# Patient Record
Sex: Female | Born: 1950 | Race: White | Hispanic: No | Marital: Married | Smoking: Never smoker
Health system: Southern US, Community
[De-identification: ages and names within clinical notes are randomized; demographics above are authoritative.]

## PROBLEM LIST (undated history)

## (undated) DIAGNOSIS — F410 Panic disorder [episodic paroxysmal anxiety] without agoraphobia: Secondary | ICD-10-CM

## (undated) DIAGNOSIS — E559 Vitamin D deficiency, unspecified: Secondary | ICD-10-CM

## (undated) DIAGNOSIS — I809 Phlebitis and thrombophlebitis of unspecified site: Secondary | ICD-10-CM

## (undated) DIAGNOSIS — Z9889 Other specified postprocedural states: Secondary | ICD-10-CM

## (undated) DIAGNOSIS — T4145XA Adverse effect of unspecified anesthetic, initial encounter: Secondary | ICD-10-CM

## (undated) DIAGNOSIS — Z8489 Family history of other specified conditions: Secondary | ICD-10-CM

## (undated) DIAGNOSIS — E039 Hypothyroidism, unspecified: Secondary | ICD-10-CM

## (undated) DIAGNOSIS — K219 Gastro-esophageal reflux disease without esophagitis: Secondary | ICD-10-CM

## (undated) DIAGNOSIS — S43006A Unspecified dislocation of unspecified shoulder joint, initial encounter: Secondary | ICD-10-CM

## (undated) DIAGNOSIS — R112 Nausea with vomiting, unspecified: Secondary | ICD-10-CM

## (undated) DIAGNOSIS — I10 Essential (primary) hypertension: Secondary | ICD-10-CM

## (undated) DIAGNOSIS — E119 Type 2 diabetes mellitus without complications: Secondary | ICD-10-CM

## (undated) DIAGNOSIS — R251 Tremor, unspecified: Secondary | ICD-10-CM

## (undated) DIAGNOSIS — D649 Anemia, unspecified: Secondary | ICD-10-CM

## (undated) DIAGNOSIS — G4730 Sleep apnea, unspecified: Secondary | ICD-10-CM

## (undated) DIAGNOSIS — E785 Hyperlipidemia, unspecified: Secondary | ICD-10-CM

## (undated) HISTORY — DX: Tremor, unspecified: R25.1

## (undated) HISTORY — DX: Vitamin D deficiency, unspecified: E55.9

## (undated) HISTORY — DX: Anemia, unspecified: D64.9

## (undated) HISTORY — DX: Hyperlipidemia, unspecified: E78.5

## (undated) HISTORY — DX: Phlebitis and thrombophlebitis of unspecified site: I80.9

## (undated) MED FILL — Iron Sucrose Inj 20 MG/ML (Fe Equiv): INTRAVENOUS | Qty: 10 | Status: AC

---

## 1973-09-21 DIAGNOSIS — I809 Phlebitis and thrombophlebitis of unspecified site: Secondary | ICD-10-CM

## 1973-09-21 HISTORY — DX: Phlebitis and thrombophlebitis of unspecified site: I80.9

## 1999-09-22 HISTORY — PX: BILATERAL SALPINGOOPHORECTOMY: SHX1223

## 2000-01-06 ENCOUNTER — Other Ambulatory Visit: Admission: RE | Admit: 2000-01-06 | Discharge: 2000-01-06 | Payer: Self-pay | Admitting: Obstetrics and Gynecology

## 2000-01-06 ENCOUNTER — Encounter (INDEPENDENT_AMBULATORY_CARE_PROVIDER_SITE_OTHER): Payer: Self-pay | Admitting: Specialist

## 2000-02-18 ENCOUNTER — Ambulatory Visit (HOSPITAL_COMMUNITY): Admission: RE | Admit: 2000-02-18 | Discharge: 2000-02-18 | Payer: Self-pay | Admitting: Obstetrics and Gynecology

## 2000-02-18 ENCOUNTER — Encounter: Payer: Self-pay | Admitting: Obstetrics and Gynecology

## 2000-02-20 HISTORY — PX: TOTAL VAGINAL HYSTERECTOMY: SHX2548

## 2000-03-08 ENCOUNTER — Inpatient Hospital Stay (HOSPITAL_COMMUNITY): Admission: RE | Admit: 2000-03-08 | Discharge: 2000-03-10 | Payer: Self-pay | Admitting: Obstetrics and Gynecology

## 2000-03-08 ENCOUNTER — Encounter (INDEPENDENT_AMBULATORY_CARE_PROVIDER_SITE_OTHER): Payer: Self-pay

## 2000-03-12 ENCOUNTER — Emergency Department (HOSPITAL_COMMUNITY): Admission: EM | Admit: 2000-03-12 | Discharge: 2000-03-12 | Payer: Self-pay

## 2001-04-26 ENCOUNTER — Ambulatory Visit (HOSPITAL_COMMUNITY): Admission: RE | Admit: 2001-04-26 | Discharge: 2001-04-26 | Payer: Self-pay | Admitting: Obstetrics and Gynecology

## 2001-04-26 ENCOUNTER — Encounter: Payer: Self-pay | Admitting: Obstetrics and Gynecology

## 2001-10-19 ENCOUNTER — Other Ambulatory Visit: Admission: RE | Admit: 2001-10-19 | Discharge: 2001-10-19 | Payer: Self-pay | Admitting: Obstetrics and Gynecology

## 2002-09-28 ENCOUNTER — Ambulatory Visit (HOSPITAL_COMMUNITY): Admission: RE | Admit: 2002-09-28 | Discharge: 2002-09-28 | Payer: Self-pay | Admitting: Obstetrics and Gynecology

## 2002-09-28 ENCOUNTER — Encounter: Payer: Self-pay | Admitting: Obstetrics and Gynecology

## 2003-09-22 DIAGNOSIS — S43006A Unspecified dislocation of unspecified shoulder joint, initial encounter: Secondary | ICD-10-CM

## 2003-09-22 HISTORY — DX: Unspecified dislocation of unspecified shoulder joint, initial encounter: S43.006A

## 2003-11-07 ENCOUNTER — Ambulatory Visit (HOSPITAL_COMMUNITY): Admission: RE | Admit: 2003-11-07 | Discharge: 2003-11-07 | Payer: Self-pay | Admitting: Obstetrics and Gynecology

## 2004-08-04 ENCOUNTER — Ambulatory Visit (HOSPITAL_BASED_OUTPATIENT_CLINIC_OR_DEPARTMENT_OTHER): Admission: RE | Admit: 2004-08-04 | Discharge: 2004-08-04 | Payer: Self-pay | Admitting: Family Medicine

## 2004-08-04 ENCOUNTER — Ambulatory Visit: Payer: Self-pay | Admitting: Pulmonary Disease

## 2004-09-23 ENCOUNTER — Ambulatory Visit: Payer: Self-pay | Admitting: Pulmonary Disease

## 2004-10-07 ENCOUNTER — Ambulatory Visit: Payer: Self-pay | Admitting: Family Medicine

## 2004-10-21 ENCOUNTER — Ambulatory Visit: Payer: Self-pay | Admitting: Pulmonary Disease

## 2004-10-30 ENCOUNTER — Ambulatory Visit (HOSPITAL_BASED_OUTPATIENT_CLINIC_OR_DEPARTMENT_OTHER): Admission: RE | Admit: 2004-10-30 | Discharge: 2004-10-30 | Payer: Self-pay | Admitting: Pulmonary Disease

## 2004-10-30 ENCOUNTER — Encounter: Payer: Self-pay | Admitting: Pulmonary Disease

## 2004-11-14 ENCOUNTER — Ambulatory Visit (HOSPITAL_COMMUNITY): Admission: RE | Admit: 2004-11-14 | Discharge: 2004-11-14 | Payer: Self-pay | Admitting: Obstetrics and Gynecology

## 2004-11-18 ENCOUNTER — Ambulatory Visit: Payer: Self-pay | Admitting: Family Medicine

## 2004-11-24 ENCOUNTER — Encounter: Payer: Self-pay | Admitting: Pulmonary Disease

## 2004-11-27 ENCOUNTER — Ambulatory Visit: Payer: Self-pay | Admitting: Pulmonary Disease

## 2005-01-05 ENCOUNTER — Ambulatory Visit: Payer: Self-pay | Admitting: Family Medicine

## 2005-02-19 HISTORY — PX: COLONOSCOPY: SHX174

## 2005-03-04 ENCOUNTER — Ambulatory Visit (HOSPITAL_COMMUNITY): Admission: RE | Admit: 2005-03-04 | Discharge: 2005-03-04 | Payer: Self-pay | Admitting: Gastroenterology

## 2005-07-31 ENCOUNTER — Ambulatory Visit: Payer: Self-pay | Admitting: Family Medicine

## 2005-08-01 ENCOUNTER — Emergency Department (HOSPITAL_COMMUNITY): Admission: EM | Admit: 2005-08-01 | Discharge: 2005-08-01 | Payer: Self-pay | Admitting: Emergency Medicine

## 2005-08-10 ENCOUNTER — Ambulatory Visit: Payer: Self-pay | Admitting: Family Medicine

## 2005-12-14 ENCOUNTER — Ambulatory Visit (HOSPITAL_COMMUNITY): Admission: RE | Admit: 2005-12-14 | Discharge: 2005-12-14 | Payer: Self-pay | Admitting: Obstetrics and Gynecology

## 2006-03-16 ENCOUNTER — Encounter: Admission: RE | Admit: 2006-03-16 | Discharge: 2006-03-16 | Payer: Self-pay | Admitting: Obstetrics and Gynecology

## 2007-01-05 ENCOUNTER — Ambulatory Visit (HOSPITAL_COMMUNITY): Admission: RE | Admit: 2007-01-05 | Discharge: 2007-01-05 | Payer: Self-pay | Admitting: Obstetrics and Gynecology

## 2007-01-12 ENCOUNTER — Encounter: Admission: RE | Admit: 2007-01-12 | Discharge: 2007-01-12 | Payer: Self-pay | Admitting: Obstetrics and Gynecology

## 2008-01-06 ENCOUNTER — Ambulatory Visit (HOSPITAL_COMMUNITY): Admission: RE | Admit: 2008-01-06 | Discharge: 2008-01-06 | Payer: Self-pay | Admitting: Obstetrics and Gynecology

## 2009-02-04 ENCOUNTER — Ambulatory Visit (HOSPITAL_COMMUNITY): Admission: RE | Admit: 2009-02-04 | Discharge: 2009-02-04 | Payer: Self-pay | Admitting: Obstetrics and Gynecology

## 2009-05-07 DIAGNOSIS — E785 Hyperlipidemia, unspecified: Secondary | ICD-10-CM

## 2009-05-07 DIAGNOSIS — G4733 Obstructive sleep apnea (adult) (pediatric): Secondary | ICD-10-CM | POA: Insufficient documentation

## 2009-05-07 DIAGNOSIS — J309 Allergic rhinitis, unspecified: Secondary | ICD-10-CM | POA: Insufficient documentation

## 2009-05-08 ENCOUNTER — Ambulatory Visit: Payer: Self-pay | Admitting: Pulmonary Disease

## 2009-05-08 DIAGNOSIS — E119 Type 2 diabetes mellitus without complications: Secondary | ICD-10-CM | POA: Insufficient documentation

## 2010-02-06 ENCOUNTER — Ambulatory Visit (HOSPITAL_COMMUNITY): Admission: RE | Admit: 2010-02-06 | Discharge: 2010-02-06 | Payer: Self-pay | Admitting: Family Medicine

## 2010-10-12 ENCOUNTER — Encounter: Payer: Self-pay | Admitting: Obstetrics and Gynecology

## 2011-02-06 NOTE — Op Note (Signed)
[facility]  Patient:    EARLY, ORD                      MRN: [id number] Proc. Date: 03/08/00 Adm. Date:  [id number] Attending:  Brynda Peon CC:         Edwena Felty. Ashley Royalty, M.D.                           Operative Report  PREOPERATIVE DIAGNOSES:  Grade 3 uterine prolapse, menorrhagia, abnormal uterine bleeding.  POSTOPERATIVE DIAGNOSES:  Grade 3 uterine prolapse, menorrhagia, abnormal uterine bleeding, peritubal cyst on left.  PROCEDURES:  Total vaginal hysterectomy, drainage of left peritubal cyst and bilateral salpingo-oophorectomy.  SURGEON:  Cynthia P. Ashley Royalty, M.D.  ASSISTANT:  Andres Ege, M.D.  ANESTHESIA:  General endotracheal.  ESTIMATED BLOOD LOSS:  250 cc.  COMPLICATIONS:  None.  DESCRIPTION OF PROCEDURE:  The patient was taken to the operating room and after the induction of adequate general endotracheal anesthesia was prepped and draped in the usual fashion and the bladder was drained with a red rubber catheter. The cervix was grasped with a Christella Hartigan tenaculum and the vaginal mucosa was injected beneath its surface with 6 cc of xylocaine with epinephrine. The knife was used to incise the vaginal mucosa off the cervix, it was pushed back sharply and bluntly. Posteriorly a colpotomy incision was made with Mayo scissors and the long Bonnano retractor was placed. The uterosacral ligaments were clamped, cut and doubly ligated with #0 chromic on each side. The uterine arteries were also then clamped, cut and doubly ligated on each side. The hysterectomy proceeded up the broad ligament clamping, cutting and tying sequentially. The pedicles containing the utero-ovarian ligament, the tube and the round ligament were clamped and the specimen was removed and sent to pathology. Inspection of the ovaries and tubes on the right revealed a normal tube and ovary; however, on the left the ovary appeared normal but the tube  had a rather large peritubal cyst associated with it. The tube and the cyst seemed congruous in that the tube was plastered across the surface of the cyst and the cyst was not pedunculated. The cyst was approximately 5 cc in diameter. A needle was inserted into the cyst and 150 cc of clear fluid was drained from the cyst when it was decompressed. It was possible to clamp the infundibulopelvic ligament behind the tube and ovary and cyst and remove the tube, ovary and cyst. The pedicle was triply tied with #0 chromic. On the right side, the pedicle containing the infundibulopelvic ligament was clamped, cut and triply ligated with #0 chromic as well. The tubes and ovaries including the left peritubal cyst were also sent to pathology. The pedicles were inspected and were hemostatic. The vaginal cuff was run in a running locking suture with #1 chromic to achieve hemostasis. A McCall stitch was placed plicating the uterosacral ligaments, the vaginal cuff was closed with figure-of-eight sutures of #1 chromic and the procedure was terminated. The patient tolerated the procedure well and went in satisfactory condition to post anesthesia recovery. Sponge, needle and instrument count was correct x 3. DD:  03/08/00 TD:  03/10/00 Job: [id number]

## 2011-02-06 NOTE — Procedures (Signed)
Kimberly Barker, Kimberly Barker NO.:  [id number]   MEDICAL RECORD NO.:  [id number]          PATIENT TYPE:  OUT   LOCATION:  SLEEP CENTER                 FACILITY:  [facility]   PHYSICIAN:  Marcelyn Bruins, M.D. [facility] DATE OF BIRTH:  12-15-1950   DATE OF STUDY:  08/04/2004                              NOCTURNAL POLYSOMNOGRAM   REFERRING PHYSICIAN:  Dr. Dina Rich   INDICATIONS FOR STUDY:  Hypersomnia with sleep apnea.   SLEEP ARCHITECTURE:  The patient had a total sleep time of 367 minutes but  never achieved REM or slow-wave sleep.  Sleep efficiency was only 80%. Sleep  onset latency was normal.   IMPRESSION:  1.  Split night study reveals a very severe obstructive sleep apnea with the      patient having 314 events in the first 152 minutes of sleep.  This gave      her a respiratory disturbance index of 124 events per hour, and O2      desaturation as low as 63%.  The patient spent the majority of the night      in the supine position. There was loud snoring throughout. By split      night protocol the patient was placed on CPAP with Respironics full face      medium sized mask and the pressure was titrated upwards. Despite 25 cm      of CPAP the patient continued to have severe break-through events with      desaturation into the high 70% range and persistent snoring.  The      patient will need 2 liters of nasal oxygen added to her CPAP therapy in      order to maintain saturations until she is able to lose weight.  2.  Multiple arrhythmias noted during the study including PAC's, PVC's, as      well as cardiac accelerations and decelerations associated with      obstructive events.  3.  Small numbers of leg jerks with very little sleep disruption.      KC/MEDQ  D:  08/19/2004 16:39:57  T:  08/20/2004 08:04:23  Job:  [id number]

## 2011-02-06 NOTE — Discharge Summary (Signed)
[facility]  Patient:    Kimberly Barker, Kimberly Barker                      MRN: [id number] Adm. Date:  [id number] Disc. Date: [id number] Attending:  Earline Mayotte R                           Discharge Summary  DISCHARGE DIAGNOSES: 1. Grade 3 uterine prolapse. 2. Menorrhagia. 3. Abnormal bleeding.  PROCEDURES: 1. Total vaginal hysterectomy. 2. Bilateral salpingo-oophorectomy. 3. Drainage and removal of left paratubal cyst.  HISTORY:  This is a 60 year old married white female, gravida 2, para 2, who presented with heavy menstrual periods, clotting and spotting all month long and grade 3 uterine prolapse.  She was admitted on March 08, 2000 and underwent total vaginal hysterectomy with bilateral salpingo-oophorectomy and drainage of a left paratubal cyst.  The paratubal cyst was quite large and was not able to be removed without draining before it was removed, with 150 cc of clear fluid drained from the cyst to decompress it.  Then, it was able to be excised.  Postoperatively, the patient did well.  She had an uneventful postoperative course and was sent home on her second postoperative day, afebrile and in good condition.  She was given full discharge instructions regarding pelvic rest, to follow up in the office in four weeks.  She was given a prescription for Premarin 1.25 mg p.o. q.d. with twelve  refills and for Percocet, #25, 1-2 p.o. q.4h. p.r.n. pain with no refills.  LABORATORY DATA:  Admission H&H 13 and 39, discharge 11 and 34.  Platelet count was normal.  Pregnancy test was negative.  Pathology showed benign proliferative endometrium with focal adenomyosis and benign paratubal cyst.  The leftovary and tube had a hydrosalpinx. DD:  03/23/00 TD:  03/23/00 Job: [id number]

## 2011-02-06 NOTE — Op Note (Signed)
Kimberly Barker, Kimberly Barker               ACCOUNT NO.:  [id number]   MEDICAL RECORD NO.:  [id number]          PATIENT TYPE:  AMB   LOCATION:  ENDO                         FACILITY:  [facility]   PHYSICIAN:  Anselmo Rod, M.D.  DATE OF BIRTH:  1951/04/12   DATE OF PROCEDURE:  03/04/2005  DATE OF DISCHARGE:                                 OPERATIVE REPORT   PROCEDURE PERFORMED:  Screening colonoscopy.   ENDOSCOPIST:  Anselmo Rod, M.D.   INSTRUMENT USED:  Olympus video colonoscope.   INDICATIONS FOR PROCEDURE:  A 60 year old white female with a family history  of colon cancer in a maternal aunt and ovarian cancer in a paternal  grandmother, screening colonoscopy.  The patient has had some change in  bowel habits, with occasional lower abdominal pain and diarrhea.  Rule out  colonic polyps masses, etc.   PRE-PROCEDURE PREPARATION:  Informed consent was procured from the patient.  The patient was fasted for eight hours prior to the procedure and prepped  with a bottle of magnesium citrate and a gallon of GoLYTELY the night prior  to the procedure.  The risks and benefits of the procedure including a 10%  miss rate of cancer and polyps were discussed with the patient as well.   PRE-PROCEDURE PHYSICAL:  VITAL SIGNS:  The patient had stable vital signs.  NECK:  Supple.  CHEST:  Clear to auscultation.  CARDIOVASCULAR:  S1, S2 regular.  ABDOMEN:  Soft, with normal bowel sounds.   DESCRIPTION OF THE PROCEDURE:  The patient was placed in the left lateral  decubitus position, sedated with 75 mg of Demerol and 5 mg of Versed in slow  incremental doses.  Once the patient was adequately sedated and maintained  on low-flow oxygen and continuous cardiac monitoring, the Olympus video  colonoscope was advanced from the rectum to the cecum.  The appendiceal  orifice and the ileocecal valve were clearly visualized and photographed.  The patient had an excellent prep.  No masses, polyps, erosions,  ulcerations, or diverticula were seen.  Retroflexion in the rectum revealed  no abnormalities.  The terminal ileum appeared healthy and without lesions.  The patient tolerated the procedure well.   IMPRESSION:  Normal colonoscopy up to the terminal ileum.  No masses,  polyps, or diverticula seen.   RECOMMENDATIONS:  1.  Continue a high-fiber diet, with liberal fluid intake.  2.  Repeat colonoscopy in the next five years unless the patient develops      any abnormal symptoms in the interim.  3.  Outpatient followup as the need arises in the future.       JNM/MEDQ  D:  03/04/2005  T:  03/04/2005  Job:  [id number]   cc:   Edwena Felty. Romine, M.D.  [address]  [place]  [place] [address]  Fax: [phone]   Dina Rich  [address]  [place]  [place] [address]  Fax: [phone]

## 2011-02-06 NOTE — Procedures (Signed)
Kimberly Barker, ARMAND NO.:  [id number]   MEDICAL RECORD NO.:  [id number]          PATIENT TYPE:  OUT   LOCATION:  SLEEP CENTER                 FACILITY:  [facility]   PHYSICIAN:  Marcelyn Bruins, M.D. [facility] DATE OF BIRTH:  03/02/1951   DATE OF STUDY:  10/30/2004                              NOCTURNAL POLYSOMNOGRAM   REFERRING PHYSICIAN:  Dr. Marcelyn Bruins   DATE OF STUDY:  October 30, 2004   INDICATION FOR THE STUDY:  Hypersomnia with sleep apnea. The patient had  been diagnosed with very severe obstructive sleep apnea and returns for CPAP  titration.   SLEEP ARCHITECTURE:  The patient had a total sleep time of 270 minutes with  a sleep efficiency of 73%. There was greatly decreased REM and slow wave  sleep. Sleep onset latency was very rapid at 11 minutes and REM onset was  quite prolonged.   IMPRESSION:  1.  Continuous positive airway pressure titration study shows fairly good      control of previously diagnosed severe obstructive sleep apnea with a      bilevel positive airway pressure setting of 21 cm for the inspiratory      pressure and 15 cm for the expiratory pressure. Patient was initially      titrated on a ResMed Full Face mask that she wears at home and was      titrated as high as 16 cm. She had persistent breakthrough events at      this level and complained of mask leaks and discomfort. She was changed      to a medium Respironics Full Face mask which was much more comfortable      for her however, could not tolerate the higher pressures to control her      obstructive events. She was subsequently changed to bilevel positive      airway pressure at 3:53 a.m. which she seemed to tolerate much better.      She was ultimately titrated to the final pressure of 21/15 which would      appear to be fairly good control of her events with only small numbers      of breakthrough.  2.  No clinically significant cardiac arrhythmia.  3.  Large numbers of leg jerks  with very little sleep disruption.      KC/MEDQ  D:  11/10/2004 12:34:02  T:  11/10/2004 21:07:43  Job:  [id number]

## 2011-02-06 NOTE — H&P (Signed)
[facility]  Patient:    Kimberly Barker, Kimberly Barker                      MRN: [id number] Adm. Date:  [id number] Disc. Date: [id number] Attending:  Armanda Heritage CC:         Edwena Felty. Ashley Royalty, M.D.             James A. Ashley Royalty, M.D. (office)                         History and Physical  EMERGENCY ROOM VISIT  HISTORY:                      This is a 60 year old white female who had a total vaginal hysterectomy and bilateral salpingo-oophorectomy March 08, 2000 per Dr. Amedeo Kinsman.  She states she was discharged home approximately March 10, 2000.  According to Dr. Candis Musa who assisted in the case, the case was relatively uncomplicated.  There was peritubal cyst on the left which was rather large but was decompressed prior to removal and removed without incident.  As mentioned above, the patient also underwent bilateral salpingo-oophorectomy.                               The patient presented with nausea and vomiting, onset March 10, 2000.  She stated she had initially done rather well postop, but on that date she experienced nausea and vomiting and this continued through the following day until approximately noon on the day of presentation to the emergency room.  It seemed to be worse after taking hydrocodone which she was given for analgesia postoperatively.  She denies any significant vaginal bleeding.  She states she has had two relatively loose stools since her procedure.  The highest temperature she has run has been approximately 100 degrees Fahrenheit.  She denies other GI, GU, or GYN symptoms.  MEDICATIONS:                  Paxil and Bacol.  PAST MEDICAL HISTORY:         Medical:  Hypercholesterolemia, panic attacks.                               Surgical:  ___ .  ALLERGIES:                    None.  PHYSICAL EXAMINATION  GENERAL:                      A well-developed, well-nourished, pleasant, obese white female in no acute distress.  VITAL SIGNS:                   Afebrile.  Vital signs stable.  SKIN:                         Warm and dry without lesions.  CHEST:                        Lungs are clear.  CARDIAC:                      Regular rate and rhythm without murmur, gallops or rubs.  BREASTS:  Exam deferred.  ABDOMEN:                      Soft and nontender.  There are no masses or organomegaly.  Bowel sounds are hypoactive but present.  MUSCULOSKELETAL:              Examination reveals full range of motion without edema, cyanosis, or CVA tenderness.  PELVIC EXAM:                  Deferred.  ACCESSORY CLINICAL FINDINGS:  CBC revealed a white blood count of 7400, hemoglobin 13.6.  Comprehensive metabolic panel was within normal limits. Urinalysis revealed a specific gravity of 1.012, small amount of leukocytes and many epithelial cells.  IMPRESSION:                   Nausea and vomiting post total abdominal                               hysterectomy and bilateral                               salpingo-oophorectomy with onset third                               postoperative day. Most likely represents                               minor postoperative ileus.  PLAN:                         The patient was administered I.M. Phenergan in the emergency room with excellent relief.  She has not vomited since noon on the day of admission to the emergency room, decision was made to proceed with p.o. Phenergan.  She was also given a Dulcolax suppository.  I also asked her to try and use ibuprofen as apposed to a narcotic for pain relief from this point forth if possible.  She is also to contact Dr. Amedeo Kinsman on March 15, 2000 for an update. DD:  03/13/00 TD:  03/13/00 Job: [id number]

## 2011-03-16 ENCOUNTER — Other Ambulatory Visit: Payer: Self-pay | Admitting: Obstetrics and Gynecology

## 2011-03-16 DIAGNOSIS — Z1231 Encounter for screening mammogram for malignant neoplasm of breast: Secondary | ICD-10-CM

## 2011-03-24 ENCOUNTER — Ambulatory Visit (HOSPITAL_COMMUNITY)
Admission: RE | Admit: 2011-03-24 | Discharge: 2011-03-24 | Disposition: A | Discharge status: Home / Self Care | Payer: BC Managed Care – PPO | Source: Ambulatory Visit | Attending: Obstetrics and Gynecology | Admitting: Obstetrics and Gynecology

## 2011-03-24 DIAGNOSIS — Z1231 Encounter for screening mammogram for malignant neoplasm of breast: Secondary | ICD-10-CM | POA: Insufficient documentation

## 2012-03-15 ENCOUNTER — Other Ambulatory Visit: Payer: Self-pay | Admitting: Nurse Practitioner

## 2012-03-15 DIAGNOSIS — Z1231 Encounter for screening mammogram for malignant neoplasm of breast: Secondary | ICD-10-CM

## 2012-04-07 ENCOUNTER — Ambulatory Visit (HOSPITAL_COMMUNITY): Payer: BC Managed Care – PPO

## 2012-04-13 ENCOUNTER — Ambulatory Visit (HOSPITAL_COMMUNITY)
Admission: RE | Admit: 2012-04-13 | Discharge: 2012-04-13 | Disposition: A | Discharge status: Home / Self Care | Payer: BC Managed Care – PPO | Source: Ambulatory Visit | Attending: Nurse Practitioner | Admitting: Nurse Practitioner

## 2012-04-13 DIAGNOSIS — Z1231 Encounter for screening mammogram for malignant neoplasm of breast: Secondary | ICD-10-CM

## 2013-04-07 ENCOUNTER — Other Ambulatory Visit: Payer: Self-pay | Admitting: Nurse Practitioner

## 2013-04-07 DIAGNOSIS — Z1231 Encounter for screening mammogram for malignant neoplasm of breast: Secondary | ICD-10-CM

## 2013-04-14 ENCOUNTER — Encounter: Payer: Self-pay | Admitting: *Deleted

## 2013-04-18 ENCOUNTER — Ambulatory Visit (INDEPENDENT_AMBULATORY_CARE_PROVIDER_SITE_OTHER): Payer: BC Managed Care – PPO | Admitting: Nurse Practitioner

## 2013-04-18 ENCOUNTER — Encounter: Payer: Self-pay | Admitting: Nurse Practitioner

## 2013-04-18 ENCOUNTER — Ambulatory Visit (HOSPITAL_COMMUNITY)
Admission: RE | Admit: 2013-04-18 | Discharge: 2013-04-18 | Disposition: A | Discharge status: Home / Self Care | Payer: BC Managed Care – PPO | Source: Ambulatory Visit | Attending: Nurse Practitioner | Admitting: Nurse Practitioner

## 2013-04-18 VITALS — BP 142/70 | HR 76 | Resp 14 | Ht 65.75 in | Wt 222.4 lb

## 2013-04-18 DIAGNOSIS — Z1211 Encounter for screening for malignant neoplasm of colon: Secondary | ICD-10-CM

## 2013-04-18 DIAGNOSIS — Z1231 Encounter for screening mammogram for malignant neoplasm of breast: Secondary | ICD-10-CM | POA: Insufficient documentation

## 2013-04-18 DIAGNOSIS — Z01419 Encounter for gynecological examination (general) (routine) without abnormal findings: Secondary | ICD-10-CM

## 2013-04-18 MED ORDER — VITAMIN D (ERGOCALCIFEROL) 1.25 MG (50000 UNIT) PO CAPS: 50000.0000 [IU] | ORAL_CAPSULE | ORAL | Status: DC

## 2013-04-18 MED ORDER — PAROXETINE HCL 20 MG PO TABS: 20.0000 mg | ORAL_TABLET | ORAL | Status: DC

## 2013-04-18 MED ORDER — FLUCONAZOLE 150 MG PO TABS: 150.0000 mg | ORAL_TABLET | Freq: Once | ORAL | Status: DC
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
## 2013-04-18 NOTE — Patient Instructions (Addendum)

## 2013-04-19 NOTE — Progress Notes (Signed)
Encounter reviewed by Dr. Latosha Gaylord Silva.  

## 2013-07-22 DIAGNOSIS — T8859XA Other complications of anesthesia, initial encounter: Secondary | ICD-10-CM

## 2013-07-22 HISTORY — DX: Other complications of anesthesia, initial encounter: T88.59XA

## 2013-07-25 HISTORY — PX: ORIF ANKLE FRACTURE: SHX5408

## 2013-10-30 ENCOUNTER — Telehealth: Payer: Self-pay | Admitting: *Deleted

## 2013-10-30 NOTE — Telephone Encounter (Signed)
Letter mailed to patient regarding recommendation for colonoscopy.  Routing to provider for final review. Will close encounter

## 2014-04-20 ENCOUNTER — Encounter (HOSPITAL_COMMUNITY): Payer: Self-pay | Admitting: Pharmacy Technician

## 2014-04-23 ENCOUNTER — Encounter: Payer: Self-pay | Admitting: Nurse Practitioner

## 2014-04-23 ENCOUNTER — Ambulatory Visit (INDEPENDENT_AMBULATORY_CARE_PROVIDER_SITE_OTHER): Payer: BC Managed Care – PPO | Admitting: Nurse Practitioner

## 2014-04-23 VITALS — BP 120/76 | HR 80 | Ht 65.5 in | Wt 212.0 lb

## 2014-04-23 DIAGNOSIS — E559 Vitamin D deficiency, unspecified: Secondary | ICD-10-CM

## 2014-04-23 DIAGNOSIS — E2839 Other primary ovarian failure: Secondary | ICD-10-CM

## 2014-04-23 DIAGNOSIS — Z01419 Encounter for gynecological examination (general) (routine) without abnormal findings: Secondary | ICD-10-CM

## 2014-04-23 MED ORDER — PAROXETINE HCL 30 MG PO TABS: 30.0000 mg | ORAL_TABLET | Freq: Every day | ORAL | Status: DC

## 2014-04-23 MED ORDER — VITAMIN D (ERGOCALCIFEROL) 1.25 MG (50000 UNIT) PO CAPS: 50000.0000 [IU] | ORAL_CAPSULE | ORAL | Status: DC
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
## 2014-04-23 NOTE — Patient Instructions (Signed)

## 2014-04-24 ENCOUNTER — Telehealth: Payer: Self-pay | Admitting: *Deleted

## 2014-04-24 LAB — VITAMIN D 25 HYDROXY (VIT D DEFICIENCY, FRACTURES): Vit D, 25-Hydroxy: 27 ng/mL — ABNORMAL LOW (ref 30–89)

## 2014-04-24 NOTE — Telephone Encounter (Signed)
I have attempted to contact this patient by phone with the following results: left message to return my call on answering machine (home per [facility]).  [phone] (Home)

## 2014-04-24 NOTE — Telephone Encounter (Signed)
Pt notified in result note.  

## 2014-04-24 NOTE — Telephone Encounter (Signed)
Message copied by Graylon Good on Tue Apr 24, 2014  2:43 PM ------      Message from: Kem Boroughs R      Created: Tue Apr 24, 2014 12:53 PM       Let patient now that her Vit D is low at 72.  The refill has been sent to pharmacy yesterday.  She needs to restart. ------

## 2014-04-24 NOTE — Telephone Encounter (Signed)
Returning a call to Stephanie. °

## 2014-04-25 ENCOUNTER — Encounter (HOSPITAL_COMMUNITY): Payer: Self-pay | Admitting: *Deleted

## 2014-04-29 NOTE — Progress Notes (Signed)
Encounter reviewed by Dr. Brook Silva.  

## 2014-05-04 ENCOUNTER — Other Ambulatory Visit: Payer: Self-pay | Admitting: Gastroenterology

## 2014-05-07 ENCOUNTER — Ambulatory Visit (HOSPITAL_COMMUNITY)
Admission: RE | Admit: 2014-05-07 | Discharge: 2014-05-07 | Disposition: A | Discharge status: Home / Self Care | Payer: BC Managed Care – PPO | Source: Ambulatory Visit | Attending: Gastroenterology | Admitting: Gastroenterology

## 2014-05-07 ENCOUNTER — Encounter (HOSPITAL_COMMUNITY)
Admission: RE | Disposition: A | Discharge status: Home / Self Care | Payer: Self-pay | Source: Ambulatory Visit | Attending: Gastroenterology

## 2014-05-07 ENCOUNTER — Ambulatory Visit (HOSPITAL_COMMUNITY): Payer: BC Managed Care – PPO | Admitting: Certified Registered Nurse Anesthetist

## 2014-05-07 ENCOUNTER — Encounter (HOSPITAL_COMMUNITY): Payer: Self-pay | Admitting: *Deleted

## 2014-05-07 ENCOUNTER — Encounter (HOSPITAL_COMMUNITY): Payer: BC Managed Care – PPO | Admitting: Certified Registered Nurse Anesthetist

## 2014-05-07 DIAGNOSIS — Z79899 Other long term (current) drug therapy: Secondary | ICD-10-CM | POA: Insufficient documentation

## 2014-05-07 DIAGNOSIS — D126 Benign neoplasm of colon, unspecified: Secondary | ICD-10-CM | POA: Insufficient documentation

## 2014-05-07 DIAGNOSIS — K620 Anal polyp: Secondary | ICD-10-CM | POA: Diagnosis not present

## 2014-05-07 DIAGNOSIS — G4730 Sleep apnea, unspecified: Secondary | ICD-10-CM | POA: Diagnosis not present

## 2014-05-07 DIAGNOSIS — Z7982 Long term (current) use of aspirin: Secondary | ICD-10-CM | POA: Insufficient documentation

## 2014-05-07 DIAGNOSIS — K5730 Diverticulosis of large intestine without perforation or abscess without bleeding: Secondary | ICD-10-CM | POA: Insufficient documentation

## 2014-05-07 DIAGNOSIS — E039 Hypothyroidism, unspecified: Secondary | ICD-10-CM | POA: Insufficient documentation

## 2014-05-07 DIAGNOSIS — K219 Gastro-esophageal reflux disease without esophagitis: Secondary | ICD-10-CM | POA: Diagnosis not present

## 2014-05-07 DIAGNOSIS — Z1211 Encounter for screening for malignant neoplasm of colon: Secondary | ICD-10-CM | POA: Insufficient documentation

## 2014-05-07 DIAGNOSIS — E119 Type 2 diabetes mellitus without complications: Secondary | ICD-10-CM | POA: Insufficient documentation

## 2014-05-07 DIAGNOSIS — I10 Essential (primary) hypertension: Secondary | ICD-10-CM | POA: Diagnosis not present

## 2014-05-07 DIAGNOSIS — K621 Rectal polyp: Secondary | ICD-10-CM

## 2014-05-07 DIAGNOSIS — F410 Panic disorder [episodic paroxysmal anxiety] without agoraphobia: Secondary | ICD-10-CM | POA: Diagnosis not present

## 2014-05-07 DIAGNOSIS — E785 Hyperlipidemia, unspecified: Secondary | ICD-10-CM | POA: Insufficient documentation

## 2014-05-07 DIAGNOSIS — Z800 Family history of malignant neoplasm of digestive organs: Secondary | ICD-10-CM | POA: Insufficient documentation

## 2014-05-07 HISTORY — DX: Type 2 diabetes mellitus without complications: E11.9

## 2014-05-07 HISTORY — DX: Gastro-esophageal reflux disease without esophagitis: K21.9

## 2014-05-07 HISTORY — DX: Family history of other specified conditions: Z84.89

## 2014-05-07 HISTORY — DX: Sleep apnea, unspecified: G47.30

## 2014-05-07 HISTORY — DX: Panic disorder (episodic paroxysmal anxiety): F41.0

## 2014-05-07 HISTORY — PX: COLONOSCOPY WITH PROPOFOL: SHX5780

## 2014-05-07 HISTORY — DX: Unspecified dislocation of unspecified shoulder joint, initial encounter: S43.006A

## 2014-05-07 HISTORY — DX: Hypothyroidism, unspecified: E03.9

## 2014-05-07 HISTORY — DX: Other specified postprocedural states: R11.2

## 2014-05-07 HISTORY — DX: Essential (primary) hypertension: I10

## 2014-05-07 HISTORY — DX: Other specified postprocedural states: Z98.890

## 2014-05-07 HISTORY — DX: Adverse effect of unspecified anesthetic, initial encounter: T41.45XA

## 2014-05-07 LAB — GLUCOSE, CAPILLARY: Glucose-Capillary: 181 mg/dL — ABNORMAL HIGH (ref 70–99)

## 2014-05-07 SURGERY — COLONOSCOPY WITH PROPOFOL
Anesthesia: Monitor Anesthesia Care

## 2014-05-07 MED ORDER — PROPOFOL 10 MG/ML IV BOLUS
INTRAVENOUS | Status: AC
  Filled 2014-05-07: qty 20

## 2014-05-07 MED ORDER — ONDANSETRON HCL 4 MG/2ML IJ SOLN
INTRAMUSCULAR | Status: DC | PRN
  Administered 2014-05-07: 4 mg via INTRAVENOUS

## 2014-05-07 MED ORDER — PROPOFOL 10 MG/ML IV BOLUS
INTRAVENOUS | Status: DC | PRN
  Administered 2014-05-07: 40 mg via INTRAVENOUS
  Administered 2014-05-07: 20 mg via INTRAVENOUS

## 2014-05-07 MED ORDER — LIDOCAINE HCL (CARDIAC) 20 MG/ML IV SOLN
INTRAVENOUS | Status: AC
  Filled 2014-05-07: qty 5

## 2014-05-07 MED ORDER — PROPOFOL INFUSION 10 MG/ML OPTIME
INTRAVENOUS | Status: DC | PRN
  Administered 2014-05-07: 75 ug/kg/min via INTRAVENOUS

## 2014-05-07 MED ORDER — LIDOCAINE HCL (CARDIAC) 20 MG/ML IV SOLN
INTRAVENOUS | Status: DC | PRN
  Administered 2014-05-07: 100 mg via INTRAVENOUS

## 2014-05-07 MED ORDER — LACTATED RINGERS IV SOLN
INTRAVENOUS | Status: DC
  Administered 2014-05-07: 12:00:00 via INTRAVENOUS

## 2014-05-07 MED ORDER — KETAMINE HCL 10 MG/ML IJ SOLN
INTRAMUSCULAR | Status: DC | PRN
  Administered 2014-05-07 (×2): 20 mg via INTRAVENOUS

## 2014-05-07 MED ORDER — SODIUM CHLORIDE 0.9 % IV SOLN: INTRAVENOUS | Status: DC

## 2014-05-07 SURGICAL SUPPLY — 22 items

## 2014-05-07 NOTE — Anesthesia Postprocedure Evaluation (Signed)
  Anesthesia Post-op Note  Patient: Kimberly Barker  Procedure(s) Performed: Procedure(s) (LRB): COLONOSCOPY WITH PROPOFOL (N/A)  Patient Location: PACU  Anesthesia Type: MAC  Level of Consciousness: awake and alert   Airway and Oxygen Therapy: Patient Spontanous Breathing  Post-op Pain: mild  Post-op Assessment: Post-op Vital signs reviewed, Patient's Cardiovascular Status Stable, Respiratory Function Stable, Patent Airway and No signs of Nausea or vomiting  Last Vitals:  Filed Vitals:   05/07/14 1329  BP:   Pulse:   Temp: 36.6 C  Resp:     Post-op Vital Signs: stable   Complications: No apparent anesthesia complications

## 2014-05-07 NOTE — Transfer of Care (Signed)
Immediate Anesthesia Transfer of Care Note  Patient: Kimberly Barker  Procedure(s) Performed: Procedure(s) (LRB): COLONOSCOPY WITH PROPOFOL (N/A)  Patient Location: PACU  Anesthesia Type: MAC  Level of Consciousness: sedated, patient cooperative and responds to stimulation  Airway & Oxygen Therapy: Patient Spontanous Breathing and Patient connected to face mask oxgen  Post-op Assessment: Report given to PACU RN and Post -op Vital signs reviewed and stable  Post vital signs: Reviewed and stable  Complications: No apparent anesthesia complications
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
## 2014-05-07 NOTE — Anesthesia Preprocedure Evaluation (Signed)
Anesthesia Evaluation  Patient identified by MRN, date of birth, ID band Patient awake    Reviewed: Allergy & Precautions, H&P , NPO status , Patient's Chart, lab work & pertinent test results  Airway Mallampati: II TM Distance: >3 FB Neck ROM: Full    Dental no notable dental hx.    Pulmonary sleep apnea ,  breath sounds clear to auscultation  Pulmonary exam normal       Cardiovascular hypertension, Rhythm:Regular Rate:Normal     Neuro/Psych negative neurological ROS  negative psych ROS   GI/Hepatic Neg liver ROS, GERD-  Medicated,  Endo/Other  diabetesHypothyroidism   Renal/GU negative Renal ROS  negative genitourinary   Musculoskeletal negative musculoskeletal ROS (+)   Abdominal   Peds negative pediatric ROS (+)  Hematology negative hematology ROS (+)   Anesthesia Other Findings   Reproductive/Obstetrics negative OB ROS                           Anesthesia Physical Anesthesia Plan  ASA: II  Anesthesia Plan: MAC   Post-op Pain Management:    Induction: Intravenous  Airway Management Planned: Nasal Cannula  Additional Equipment:   Intra-op Plan:   Post-operative Plan:   Informed Consent: I have reviewed the patients History and Physical, chart, labs and discussed the procedure including the risks, benefits and alternatives for the proposed anesthesia with the patient or authorized representative who has indicated his/her understanding and acceptance.   Dental advisory given  Plan Discussed with: CRNA and Surgeon  Anesthesia Plan Comments:         Anesthesia Quick Evaluation

## 2014-05-07 NOTE — Discharge Instructions (Addendum)

## 2014-05-07 NOTE — Op Note (Signed)
[facility] [place], [address]   OPERATIVE PROCEDURE REPORT  PATIENT: Kimberly Barker, Kimberly Barker  MR#: [id number] BIRTHDATE: 02/26/1951 GENDER: Female ENDOSCOPIST: Edmonia James, MD ASSISTANT:   Sharon Mt, Endo Technician Luanne Bras, RN CGRN PROCEDURE DATE: 05/07/2014 PRE-PROCEDURE PREPARATION: The patient was prepped with a gallon of Golytely the night prior to the procedure.  The patient was fasted for 4 hours prior to the procedure.  PRE-PROCEDURE PHYSICAL: Patient has stable vital signs.  Neck is supple.  There is no JVD, thyromegaly or LAD.  Chest clear to auscultation.  Abdomen soft, morbidly obese, non-distended, non-tender with NABS.  S1 and S2 regular. PROCEDURE:     Colonoscopy with cold biopsy polypectomy ASA CLASS:     Class III INDICATIONS:     1.  Colorectal cancer screening. MEDICATIONS:     Diprivan 220mg  IV  DESCRIPTION OF PROCEDURE: After the risks, benefits, and alternatives of the procedure were thoroughly explained [including a 10% missed rate of cancer and polyps], informed consent was obtained.  Digital rectal exam was performed.  The Pentax Slim Colonicsope [id number])  was introduced through the anus  and advanced to the terminal ileum which was intubated for a short distance , limited by No adverse events experienced. The quality of the prep was good. . Multiple washes were done. Small lesions could be missed. The instrument was then slowly withdrawn as the colon was fully examined.     COLON FINDINGS: Two sessile polyps ranging between 5-15mm in size were found at the cecum-these were removed by hot snare x 2-150/15. A dimunitive polyp was removed by cold biopsy x 1 from the cecum and another one was removed by cold biopsy x 1 from the rectum, The resection was complete and the polyp tissue was completely retrieved. There were a couple of sigmoid diverticula noted. The rest of the colonic mucosa appeared healthy with  a normal vascular pattern. No masses or AVMs were noted.  The appendiceal orifice and the ICV were identified and photographed.  The terminal ileum appeared normal.  Retroflexed views revealed no abnormalities. The patient tolerated the procedure without immediate complications. The scope was then withdrawn from the patient and the procedure terminated.    TIME TO CECUM:   7 minutes 00 seconds WITHDRAW TIME:  8 minutes 00 seconds      IMPRESSION:     1.  Two sessile polyps ranging between 5-70mm in size were found at the cecum; polypectomy was performed using hot snare cautery; these were retreived 2.  A couple of small scattered diverticula in the sigmoid colon. 3.  One dimunitive cecal polyp and one such rectal polyp-removed by cold biopsies x 2 4.  Normal terminal ileum    RECOMMENDATIONS:     1.  Hold aspirin, aspirin products, and anti-inflammatory medication for 2 weeks. 2.  Await pathology results 3.  Continue current medications 4.  Continue surveillance 5.  High fiber diet with liberal fluid intake. 6.  OP follow-up is advised on a PRN basis.   REPEAT EXAM:      In 5 years  for a repeat colonoscopy.  If the patient has any abnormal GI symptoms in the interim, she has been advised to contact the office as soon as possible for further recommendations.    CPT CODES:     [id number], Colonoscopy with Polypectomy (Snare); 45380-Colonoscopy with biopsy [id number]   DIAGNOSIS CODES:     V76.51 Colorectal cancer screening, 211.3, Colonic polyps; 562.10  diverticulosis   REFERRED PX:TGGYIRS Dough, M.D.  eSigned:  Dr. Edmonia James, MD 05/07/2014 1:34 PM   PATIENT NAME:  Kimberly Barker MR#: [id number]

## 2014-05-08 ENCOUNTER — Encounter (HOSPITAL_COMMUNITY): Payer: Self-pay | Admitting: Gastroenterology

## 2014-05-24 ENCOUNTER — Ambulatory Visit (HOSPITAL_COMMUNITY)
Admission: RE | Admit: 2014-05-24 | Discharge: 2014-05-24 | Disposition: A | Discharge status: Home / Self Care | Payer: BC Managed Care – PPO | Source: Ambulatory Visit | Attending: Nurse Practitioner | Admitting: Nurse Practitioner

## 2014-05-24 DIAGNOSIS — Z1231 Encounter for screening mammogram for malignant neoplasm of breast: Secondary | ICD-10-CM | POA: Insufficient documentation

## 2014-05-24 DIAGNOSIS — Z01419 Encounter for gynecological examination (general) (routine) without abnormal findings: Secondary | ICD-10-CM

## 2014-07-23 ENCOUNTER — Encounter (HOSPITAL_COMMUNITY): Payer: Self-pay | Admitting: Gastroenterology

## 2015-04-29 ENCOUNTER — Ambulatory Visit (INDEPENDENT_AMBULATORY_CARE_PROVIDER_SITE_OTHER): Payer: BC Managed Care – PPO | Admitting: Nurse Practitioner

## 2015-04-29 ENCOUNTER — Encounter: Payer: Self-pay | Admitting: Nurse Practitioner

## 2015-04-29 VITALS — BP 120/76 | HR 80 | Ht 65.75 in | Wt 202.0 lb

## 2015-04-29 DIAGNOSIS — E559 Vitamin D deficiency, unspecified: Secondary | ICD-10-CM | POA: Diagnosis not present

## 2015-04-29 DIAGNOSIS — Z01419 Encounter for gynecological examination (general) (routine) without abnormal findings: Secondary | ICD-10-CM | POA: Diagnosis not present

## 2015-04-29 DIAGNOSIS — E2839 Other primary ovarian failure: Secondary | ICD-10-CM | POA: Diagnosis not present

## 2015-04-29 DIAGNOSIS — Z0000 Encounter for general adult medical examination without abnormal findings: Secondary | ICD-10-CM

## 2015-04-29 MED ORDER — VITAMIN D (ERGOCALCIFEROL) 1.25 MG (50000 UNIT) PO CAPS: 50000.0000 [IU] | ORAL_CAPSULE | ORAL | Status: DC

## 2015-04-29 NOTE — Patient Instructions (Addendum)
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
## 2015-04-30 LAB — VITAMIN D 25 HYDROXY (VIT D DEFICIENCY, FRACTURES): Vit D, 25-Hydroxy: 39 ng/mL (ref 30–100)

## 2015-04-30 NOTE — Progress Notes (Signed)
Encounter reviewed by Dr. Nalanie Winiecki Amundson C. Silva.  

## 2015-05-16 ENCOUNTER — Other Ambulatory Visit: Payer: Self-pay | Admitting: Nurse Practitioner

## 2015-05-17 NOTE — Telephone Encounter (Signed)
Medication refill request: Paxil 30 mg Last AEX:  04/29/15 PG Next AEX: 05/04/16 PG Last MMG (if hormonal medication request): 05/25/14 BIRADS1:neg Refill authorized: 04/23/14 #[id number]/3R. Today please advise.

## 2015-11-15 ENCOUNTER — Other Ambulatory Visit: Payer: Self-pay

## 2015-11-15 DIAGNOSIS — Z1231 Encounter for screening mammogram for malignant neoplasm of breast: Secondary | ICD-10-CM

## 2015-12-05 ENCOUNTER — Ambulatory Visit
Admission: RE | Admit: 2015-12-05 | Discharge: 2015-12-05 | Disposition: A | Discharge status: Home / Self Care | Payer: BC Managed Care – PPO | Source: Ambulatory Visit | Attending: Nurse Practitioner | Admitting: Nurse Practitioner

## 2015-12-05 ENCOUNTER — Ambulatory Visit
Admission: RE | Admit: 2015-12-05 | Discharge: 2015-12-05 | Disposition: A | Discharge status: Home / Self Care | Payer: BC Managed Care – PPO | Source: Ambulatory Visit

## 2015-12-05 DIAGNOSIS — E2839 Other primary ovarian failure: Secondary | ICD-10-CM

## 2015-12-05 DIAGNOSIS — Z1231 Encounter for screening mammogram for malignant neoplasm of breast: Secondary | ICD-10-CM

## 2015-12-09 NOTE — Progress Notes (Signed)
Quick Note:  Please let patient know that BMD done on 12/05/15 shows a T Score at spine -1.3; right hip neck -1.4; left hip neck -0.7. The lowest marker is the right hip and it falls in the lower normal or upper Osteopenic range. Comparison to previous exam in 03/16/2006 there has been a loss at the spine and left hip. Right hip was not used in study for 2007. The FRAX score for major fracture in 10 yrs is 8.0% (goal is <20%); FRAX score for hip fracture in 10 yrs is 0.7% (goal is <3%). Her FRAX score look pretty good. While some bone loss is normal and expected post menopausal, we do not want to see further loss. Must get walking and upper body exercise. Must maintain calcium and Vit D support. Repeat in 2 yrs. ______

## 2015-12-10 ENCOUNTER — Telehealth: Payer: Self-pay | Admitting: *Deleted

## 2015-12-10 NOTE — Telephone Encounter (Signed)
-----   Message from Kem Boroughs, [place] sent at 12/09/2015 12:44 PM EDT ----- Please let patient know that BMD done on 12/05/15 shows a T Score at spine -1.3; right hip neck -1.4; left hip neck -0.7.  The lowest marker is the right hip and it falls in the lower normal or upper Osteopenic range.  Comparison to previous exam in 03/16/2006 there has been a loss at the spine and left hip.  Right hip was not used in study for 2007.   The FRAX score for major fracture in 10 yrs is 8.0% (goal is <20%); FRAX score for hip fracture in 10 yrs is 0.7% (goal is <3%).  Her FRAX score look pretty good.   While some bone loss is normal and expected post menopausal, we do not want to see further loss.  Must get walking and upper body exercise.  Must maintain calcium and Vit D support. Repeat in 2 yrs.

## 2015-12-10 NOTE — Telephone Encounter (Signed)
I have attempted to contact this patient by phone with the following results: left message to return call to [place] at [phone] answering machine (home per [facility]).  Advised call was regarding BMD results.  [phone] (Home)

## 2015-12-11 NOTE — Telephone Encounter (Signed)
Pt notified of results and is agreeable with plan.  Excited with results.  Closing encounter.

## 2015-12-11 NOTE — Telephone Encounter (Signed)
Patient returned your call.

## 2016-05-04 ENCOUNTER — Ambulatory Visit (INDEPENDENT_AMBULATORY_CARE_PROVIDER_SITE_OTHER): Payer: Medicare Other | Admitting: Nurse Practitioner

## 2016-05-04 ENCOUNTER — Encounter: Payer: Self-pay | Admitting: Nurse Practitioner

## 2016-05-04 VITALS — BP 130/84 | HR 72 | Ht 65.25 in | Wt 201.0 lb

## 2016-05-04 DIAGNOSIS — Z1159 Encounter for screening for other viral diseases: Secondary | ICD-10-CM | POA: Diagnosis not present

## 2016-05-04 DIAGNOSIS — Z0000 Encounter for general adult medical examination without abnormal findings: Secondary | ICD-10-CM | POA: Diagnosis not present

## 2016-05-04 DIAGNOSIS — Z01419 Encounter for gynecological examination (general) (routine) without abnormal findings: Secondary | ICD-10-CM

## 2016-05-04 DIAGNOSIS — E559 Vitamin D deficiency, unspecified: Secondary | ICD-10-CM | POA: Diagnosis not present

## 2016-05-04 MED ORDER — VITAMIN D (ERGOCALCIFEROL) 1.25 MG (50000 UNIT) PO CAPS: 50000.0000 [IU] | ORAL_CAPSULE | ORAL | 3 refills | Status: DC

## 2016-05-04 NOTE — Progress Notes (Signed)
Reviewed personally.  M. Suzanne Sherell Christoffel, MD.  

## 2016-05-04 NOTE — Patient Instructions (Addendum)
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
## 2016-05-05 LAB — VITAMIN D 25 HYDROXY (VIT D DEFICIENCY, FRACTURES): VIT D 25 HYDROXY: 26 ng/mL — AB (ref 30–100)

## 2016-05-05 LAB — HEPATITIS C ANTIBODY: HCV AB: NEGATIVE

## 2016-05-05 LAB — HIV ANTIBODY (ROUTINE TESTING W REFLEX): HIV: NONREACTIVE

## 2016-07-08 DIAGNOSIS — E039 Hypothyroidism, unspecified: Secondary | ICD-10-CM

## 2016-07-08 DIAGNOSIS — I10 Essential (primary) hypertension: Secondary | ICD-10-CM

## 2016-07-08 DIAGNOSIS — N390 Urinary tract infection, site not specified: Secondary | ICD-10-CM | POA: Diagnosis not present

## 2016-07-08 DIAGNOSIS — E785 Hyperlipidemia, unspecified: Secondary | ICD-10-CM

## 2016-07-08 DIAGNOSIS — E119 Type 2 diabetes mellitus without complications: Secondary | ICD-10-CM | POA: Diagnosis not present

## 2016-07-08 DIAGNOSIS — R310 Gross hematuria: Secondary | ICD-10-CM | POA: Diagnosis not present

## 2016-07-08 DIAGNOSIS — D72829 Elevated white blood cell count, unspecified: Secondary | ICD-10-CM | POA: Diagnosis not present

## 2016-07-09 DIAGNOSIS — D72829 Elevated white blood cell count, unspecified: Secondary | ICD-10-CM | POA: Diagnosis not present

## 2016-07-09 DIAGNOSIS — N390 Urinary tract infection, site not specified: Secondary | ICD-10-CM | POA: Diagnosis not present

## 2016-07-09 DIAGNOSIS — R310 Gross hematuria: Secondary | ICD-10-CM | POA: Diagnosis not present

## 2016-07-09 DIAGNOSIS — E119 Type 2 diabetes mellitus without complications: Secondary | ICD-10-CM | POA: Diagnosis not present

## 2016-09-17 ENCOUNTER — Telehealth: Payer: Self-pay | Admitting: Nurse Practitioner

## 2016-09-17 NOTE — Telephone Encounter (Addendum)
Spoke with patient. Patients state she has a vaginal itch that started the end of last week along with a cottage cheese creamy white discharge. Patient states she has been using cortisone 10 to the vaginal area with very little relief. Patient states she used OTC Monistat the week prior and symptoms went away for approximately 4 days before appearing again. Recommended OV for further evaluation, patient scheduled for 09/18/16 at 10:15am with Kem Boroughs, NP. Patient declined 12/28 appointment times with covering provider. Patient is agreeable to date and time. Last AEX 05/04/16  Routing to provider for final review. Patient is agreeable to disposition. Will close encounter.  Cc: Kem Boroughs, NP

## 2016-09-17 NOTE — Telephone Encounter (Signed)
Patient thinks she has a yeast infection and wants something called into the pharmacy.  States she has itching and a white cottage cheese textured discharge.

## 2016-09-18 ENCOUNTER — Encounter: Payer: Self-pay | Admitting: Nurse Practitioner

## 2016-09-18 ENCOUNTER — Ambulatory Visit (INDEPENDENT_AMBULATORY_CARE_PROVIDER_SITE_OTHER): Payer: Medicare Other | Admitting: Nurse Practitioner

## 2016-09-18 VITALS — BP 136/92 | HR 64 | Ht 65.25 in | Wt 199.0 lb

## 2016-09-18 DIAGNOSIS — N760 Acute vaginitis: Secondary | ICD-10-CM | POA: Diagnosis not present

## 2016-09-18 MED ORDER — NYSTATIN 100000 UNIT/GM EX CREA: 1.0000 "application " | TOPICAL_CREAM | Freq: Two times a day (BID) | CUTANEOUS | 1 refills | Status: DC

## 2016-09-18 MED ORDER — FLUCONAZOLE 150 MG PO TABS: 150.0000 mg | ORAL_TABLET | Freq: Once | ORAL | 1 refills | Status: AC

## 2016-09-18 MED ORDER — TRIAMCINOLONE ACETONIDE 0.025 % EX OINT: 1.0000 "application " | TOPICAL_OINTMENT | Freq: Two times a day (BID) | CUTANEOUS | 0 refills | Status: DC

## 2016-09-18 NOTE — Patient Instructions (Signed)

## 2016-09-18 NOTE — Progress Notes (Signed)
65 y.o. Married Caucasian female G2P0002 here with complaint of vaginal symptoms of itching, burning, and increase discharge. Describes discharge as thick white.    Onset of symptoms 10 days ago. Denies new personal products or vaginal dryness.  Tried OTC Monistat but trouble with insertion of applicator.  She feels the itching in sometimes overwhelming.   Denies Urinary symptoms. She had a UTI in October.  She developed hematuria and with CT scan and found a 'growth' on her kidney. They had planned a cysto and removal of mass but found that it was gone at her pre op CT scan just before surgery.  (not able to see those records in [facility]).   O:  Healthy female WDWN Affect: normal, orientation x 3  Exam: no distress Abdomen: Lymph node: no enlargement or tenderness Pelvic exam: External genital: normal female with extensive erythema and linear cuts along both labia tot he perineum and inguinal areas. BUS: negative Vagina: white thin discharge noted.  Affirm taken. Adnexa:normal, non tender, no masses or fullness noted   A: Vaginitis - most c/w yeast internal and externally  Recent UTI 06/2016   P: Discussed findings of vaginitis and etiology. Discussed Aveeno or baking soda sitz bath for comfort. Avoid moist clothes or pads for extended period of time. If working out in gym clothes or swim suits for long periods of time change underwear or bottoms of swimsuit if possible. Olive Oil/Coconut Oil use for skin protection prior to activity can be used to external skin.  Rx: she is given Diflucan 150 mg X 2; Triamcinolone and Nystatin to mix together and apply topically BID  Follow with Affirm  RV prn

## 2016-09-19 LAB — WET PREP BY MOLECULAR PROBE
Candida species: NEGATIVE
GARDNERELLA VAGINALIS: NEGATIVE
Trichomonas vaginosis: NEGATIVE

## 2016-09-19 NOTE — Progress Notes (Signed)
Encounter reviewed by Dr. Brook Amundson C. Silva.  

## 2016-09-23 ENCOUNTER — Other Ambulatory Visit: Payer: Self-pay | Admitting: Nurse Practitioner

## 2016-12-09 ENCOUNTER — Ambulatory Visit (INDEPENDENT_AMBULATORY_CARE_PROVIDER_SITE_OTHER): Payer: Medicare Other | Admitting: Nurse Practitioner

## 2016-12-09 ENCOUNTER — Encounter: Payer: Self-pay | Admitting: Nurse Practitioner

## 2016-12-09 ENCOUNTER — Telehealth: Payer: Self-pay | Admitting: *Deleted

## 2016-12-09 VITALS — BP 140/80 | HR 82 | Resp 20 | Ht 65.25 in | Wt 198.4 lb

## 2016-12-09 DIAGNOSIS — N760 Acute vaginitis: Secondary | ICD-10-CM

## 2016-12-09 MED ORDER — NYSTATIN 100000 UNIT/GM EX CREA: 1.0000 "application " | TOPICAL_CREAM | Freq: Two times a day (BID) | CUTANEOUS | 3 refills | Status: DC

## 2016-12-09 MED ORDER — FLUCONAZOLE 150 MG PO TABS: ORAL_TABLET | ORAL | 3 refills | Status: DC

## 2016-12-09 MED ORDER — TRIAMCINOLONE ACETONIDE 0.025 % EX OINT: 1.0000 "application " | TOPICAL_OINTMENT | Freq: Two times a day (BID) | CUTANEOUS | 3 refills | Status: DC

## 2016-12-09 NOTE — Patient Instructions (Signed)

## 2016-12-09 NOTE — Telephone Encounter (Signed)
Zack from [facility] drug calling to clarify location of where triamcinolone ointment to be applied to determine how many grams to be applied. Advised to be applied to affected area, vagina.   Routing to provider for final review. Patient is agreeable to disposition. Will close encounter.

## 2016-12-09 NOTE — Progress Notes (Signed)
66 y.o. Married Caucasian female G2P0002 here with complaint of vaginal symptoms of itching and increase discharge. Describes discharge as whitish. Onset of symptoms 7 days ago. Denies new personal products or vaginal dryness.  She has completed several rounds of antibiotics for a bone graft to her gums. Anticipating more work to be done.  She did not want to wait and symptoms to be as bad as they were in the past.   Urinary symptoms none . Contraception is hysterectomy.   O:  Healthy female WDWN Affect: normal, orientation x 3  Exam: no distress Abdomen: soft and non tender Lymph node: no enlargement or tenderness Pelvic exam: External genital: normal female with extensive areas of fungal infection BUS: negative Vagina: thin clear discharge noted.  Affirm taken.    A: Yeast Vaginitis  History of DM  P: Discussed findings of vaginitis and etiology. Discussed Aveeno or baking soda sitz bath for comfort. Avoid moist clothes or pads for extended period of time. If working out in gym clothes or swim suits for long periods of time change underwear or bottoms of swimsuit if possible. Olive Oil/Coconut Oil use for skin protection prior to activity can be used to external skin.  Rx: Diflucan 150 mg  Refill on Nystatin and Triamcinolone to use externally BID prn  Follow with Affirm  RV prn

## 2016-12-10 ENCOUNTER — Telehealth: Payer: Self-pay

## 2016-12-10 LAB — WET PREP BY MOLECULAR PROBE
CANDIDA SPECIES: DETECTED — AB
Gardnerella vaginalis: NOT DETECTED
Trichomonas vaginosis: NOT DETECTED

## 2016-12-10 NOTE — Progress Notes (Signed)
Encounter reviewed by Dr. Brook Amundson C. Silva.  

## 2016-12-10 NOTE — Telephone Encounter (Signed)
-----   Message from Regina Eck, CNM sent at 12/10/2016  4:32 PM EDT ----- Notify patient that affirm showed positive for yeast, so she is on appropriate treatment. Negative for BV and trichomonas

## 2016-12-10 NOTE — Telephone Encounter (Signed)
Left message for patient to call Summer back.  

## 2016-12-11 NOTE — Telephone Encounter (Signed)
Patient advised of wet prep lab results. Patient verbalized understanding and agreement.

## 2017-02-04 ENCOUNTER — Other Ambulatory Visit: Payer: Self-pay | Admitting: Nurse Practitioner

## 2017-02-04 DIAGNOSIS — Z1231 Encounter for screening mammogram for malignant neoplasm of breast: Secondary | ICD-10-CM

## 2017-02-23 ENCOUNTER — Ambulatory Visit
Admission: RE | Admit: 2017-02-23 | Discharge: 2017-02-23 | Disposition: A | Discharge status: Home / Self Care | Payer: Medicare Other | Source: Ambulatory Visit | Attending: Nurse Practitioner | Admitting: Nurse Practitioner

## 2017-02-23 DIAGNOSIS — Z1231 Encounter for screening mammogram for malignant neoplasm of breast: Secondary | ICD-10-CM

## 2017-03-30 ENCOUNTER — Telehealth: Payer: Self-pay | Admitting: Obstetrics & Gynecology

## 2017-03-30 NOTE — Telephone Encounter (Signed)
Left message regarding upcoming appointment has been canceled and needs to be rescheduled. °

## 2017-05-05 ENCOUNTER — Ambulatory Visit: Payer: Medicare Other | Admitting: Nurse Practitioner

## 2017-06-02 ENCOUNTER — Other Ambulatory Visit: Payer: Self-pay

## 2017-06-02 DIAGNOSIS — E559 Vitamin D deficiency, unspecified: Secondary | ICD-10-CM

## 2017-06-02 NOTE — Telephone Encounter (Signed)
The patient needs another vit d level prior to prescribing more vit D. Please set her up for a lab appointment and order a vit D

## 2017-06-02 NOTE — Telephone Encounter (Signed)
Medication refill request: Vitamin D 50,000 Last AEX:  05/04/16 PG Next AEX: 07/20/17 BS Last MMG (if hormonal medication request): 02/23/17 BIRADS 1 negative/density b Refill authorized: 05/04/16 #90 w/3 refills; today please advise, Dr. Quincy Simmonds out of office.

## 2017-06-03 NOTE — Telephone Encounter (Signed)
Spoke with patient, will take last pill on next Thursday. Patient scheduled for lab appointment on Friday 06/11/17.

## 2017-06-11 ENCOUNTER — Other Ambulatory Visit (INDEPENDENT_AMBULATORY_CARE_PROVIDER_SITE_OTHER): Payer: Medicare Other

## 2017-06-11 DIAGNOSIS — E559 Vitamin D deficiency, unspecified: Secondary | ICD-10-CM

## 2017-06-12 LAB — VITAMIN D 25 HYDROXY (VIT D DEFICIENCY, FRACTURES): Vit D, 25-Hydroxy: 31.3 ng/mL (ref 30.0–100.0)

## 2017-06-14 ENCOUNTER — Encounter: Payer: Self-pay | Admitting: Obstetrics and Gynecology

## 2017-07-05 ENCOUNTER — Other Ambulatory Visit: Payer: Self-pay | Admitting: *Deleted

## 2017-07-05 NOTE — Telephone Encounter (Signed)
Medication refill request: Diflucan  Last AEX:  05-04-16  Next AEX: 07-20-17  Last MMG (if hormonal medication request): 02-23-17 WNL  Refill authorized: please advise

## 2017-07-05 NOTE — Telephone Encounter (Signed)
Needs office visit for evaluation for this Rx.

## 2017-07-09 ENCOUNTER — Other Ambulatory Visit: Payer: Self-pay | Admitting: *Deleted

## 2017-07-09 NOTE — Telephone Encounter (Signed)
Medication refill request: fluconazole  Last AEX:  05/04/16 PG Next AEX: 07/20/17 Dr. Quincy Simmonds  Last MMG (if hormonal medication request): 02/23/17 BIRADS1:neg  Refill authorized: 12/09/16 #4tabs/3R. Today please advise.

## 2017-07-20 ENCOUNTER — Encounter: Payer: Self-pay | Admitting: Obstetrics and Gynecology

## 2017-07-20 ENCOUNTER — Ambulatory Visit (INDEPENDENT_AMBULATORY_CARE_PROVIDER_SITE_OTHER): Payer: Medicare Other | Admitting: Obstetrics and Gynecology

## 2017-07-20 VITALS — BP 140/80 | HR 70 | Resp 16 | Ht 65.5 in | Wt 197.0 lb

## 2017-07-20 DIAGNOSIS — Z01419 Encounter for gynecological examination (general) (routine) without abnormal findings: Secondary | ICD-10-CM | POA: Diagnosis not present

## 2017-07-20 NOTE — Patient Instructions (Signed)
EXERCISE AND DIET:  We recommended that you start or continue a regular exercise program for good health. Regular exercise means any activity that makes your heart beat faster and makes you sweat.  We recommend exercising at least 30 minutes per day at least 3 days a week, preferably 4 or 5.  We also recommend a diet low in fat and sugar.  Inactivity, poor dietary choices and obesity can cause diabetes, heart attack, stroke, and kidney damage, among others.    ALCOHOL AND SMOKING:  Women should limit their alcohol intake to no more than 7 drinks/beers/glasses of wine (combined, not each!) per week. Moderation of alcohol intake to this level decreases your risk of breast cancer and liver damage. And of course, no recreational drugs are part of a healthy lifestyle.  And absolutely no smoking or even second hand smoke. Most people know smoking can cause heart and lung diseases, but did you know it also contributes to weakening of your bones? Aging of your skin?  Yellowing of your teeth and nails?  CALCIUM AND VITAMIN D:  Adequate intake of calcium and Vitamin D are recommended.  The recommendations for exact amounts of these supplements seem to change often, but generally speaking 600 mg of calcium (either carbonate or citrate) and 800 units of Vitamin D per day seems prudent. Certain women may benefit from higher intake of Vitamin D.  If you are among these women, your doctor will have told you during your visit.    PAP SMEARS:  Pap smears, to check for cervical cancer or precancers,  have traditionally been done yearly, although recent scientific advances have shown that most women can have pap smears less often.  However, every woman still should have a physical exam from her gynecologist every year. It will include a breast check, inspection of the vulva and vagina to check for abnormal growths or skin changes, a visual exam of the cervix, and then an exam to evaluate the size and shape of the uterus and  ovaries.  And after 66 years of age, a rectal exam is indicated to check for rectal cancers. We will also provide age appropriate advice regarding health maintenance, like when you should have certain vaccines, screening for sexually transmitted diseases, bone density testing, colonoscopy, mammograms, etc.   MAMMOGRAMS:  All women over 40 years old should have a yearly mammogram. Many facilities now offer a "3D" mammogram, which may cost around $50 extra out of pocket. If possible,  we recommend you accept the option to have the 3D mammogram performed.  It both reduces the number of women who will be called back for extra views which then turn out to be normal, and it is better than the routine mammogram at detecting truly abnormal areas.    COLONOSCOPY:  Colonoscopy to screen for colon cancer is recommended for all women at age 50.  We know, you hate the idea of the prep.  We agree, BUT, having colon cancer and not knowing it is worse!!  Colon cancer so often starts as a polyp that can be seen and removed at colonscopy, which can quite literally save your life!  And if your first colonoscopy is normal and you have no family history of colon cancer, most women don't have to have it again for 10 years.  Once every ten years, you can do something that may end up saving your life, right?  We will be happy to help you get it scheduled when you are ready.    Be sure to check your insurance coverage so you understand how much it will cost.  It may be covered as a preventative service at no cost, but you should check your particular policy.      Kegel Exercises Kegel exercises help strengthen the muscles that support the rectum, vagina, small intestine, bladder, and uterus. Doing Kegel exercises can help:  Improve bladder and bowel control.  Improve sexual response.  Reduce problems and discomfort during pregnancy.  Kegel exercises involve squeezing your pelvic floor muscles, which are the same muscles you  squeeze when you try to stop the flow of urine. The exercises can be done while sitting, standing, or lying down, but it is best to vary your position. Phase 1 exercises 1. Squeeze your pelvic floor muscles tight. You should feel a tight lift in your rectal area. If you are a female, you should also feel a tightness in your vaginal area. Keep your stomach, buttocks, and legs relaxed. 2. Hold the muscles tight for up to 10 seconds. 3. Relax your muscles. Repeat this exercise 50 times a day or as many times as told by your health care provider. Continue to do this exercise for at least 4-6 weeks or for as long as told by your health care provider. This information is not intended to replace advice given to you by your health care provider. Make sure you discuss any questions you have with your health care provider. Document Released: 08/24/2012 Document Revised: 05/02/2016 Document Reviewed: 07/28/2015 Elsevier Interactive Patient Education  2018 [employer]  
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
## 2018-02-02 ENCOUNTER — Telehealth: Payer: Self-pay | Admitting: Obstetrics and Gynecology

## 2018-02-02 NOTE — Telephone Encounter (Signed)
Patient thinks she has a yeast infection. To triage to assist with scheduling with Dr.Silva today or tomorrow.

## 2018-02-02 NOTE — Telephone Encounter (Signed)
Appt with me for 10:00 am tomorrow.

## 2018-02-02 NOTE — Telephone Encounter (Signed)
Spoke with patient. Patient states that she has a long history of having yeast infections. Started having vaginal itching and thick white discharge on Sunday. Reports she used to use the OTC monistat, but is unable to insert the applicator now. Was just on antibiotics for a sinus infection before symptoms started. Requesting an appointment for tomorrow. Advised will review with Dr.Silva and return call.  Dr.Silva, please review scheduling.

## 2018-02-02 NOTE — Telephone Encounter (Signed)
Spoke with patient. Appointment scheduled for tomorrow at 10 am with Dr.Silva. Patient is agreeable to date and time. Encounter closed.

## 2018-02-03 ENCOUNTER — Other Ambulatory Visit: Payer: Self-pay

## 2018-02-03 ENCOUNTER — Encounter: Payer: Self-pay | Admitting: Obstetrics and Gynecology

## 2018-02-03 ENCOUNTER — Ambulatory Visit: Payer: Medicare Other | Admitting: Obstetrics and Gynecology

## 2018-02-03 VITALS — BP 142/76 | HR 84 | Resp 16 | Ht 65.5 in | Wt 197.0 lb

## 2018-02-03 DIAGNOSIS — N760 Acute vaginitis: Secondary | ICD-10-CM | POA: Diagnosis not present

## 2018-02-03 MED ORDER — TRIAMCINOLONE ACETONIDE 0.025 % EX OINT: 1.0000 "application " | TOPICAL_OINTMENT | Freq: Two times a day (BID) | CUTANEOUS | 3 refills | Status: DC

## 2018-02-03 MED ORDER — FLUCONAZOLE 150 MG PO TABS: 150.0000 mg | ORAL_TABLET | Freq: Once | ORAL | 0 refills | Status: AC

## 2018-02-03 MED ORDER — NYSTATIN 100000 UNIT/GM EX CREA: 1.0000 "application " | TOPICAL_CREAM | Freq: Two times a day (BID) | CUTANEOUS | 0 refills | Status: DC

## 2018-02-03 MED ORDER — NYSTATIN 100000 UNIT/GM EX POWD: Freq: Three times a day (TID) | CUTANEOUS | 0 refills | Status: DC

## 2018-02-03 NOTE — Patient Instructions (Signed)
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
## 2018-02-04 LAB — VAGINITIS/VAGINOSIS, DNA PROBE
CANDIDA SPECIES: NEGATIVE
Gardnerella vaginalis: NEGATIVE
Trichomonas vaginosis: NEGATIVE

## 2018-07-11 ENCOUNTER — Other Ambulatory Visit: Payer: Self-pay | Admitting: Obstetrics and Gynecology

## 2018-07-11 DIAGNOSIS — Z1231 Encounter for screening mammogram for malignant neoplasm of breast: Secondary | ICD-10-CM

## 2018-08-05 ENCOUNTER — Ambulatory Visit: Payer: Medicare Other | Admitting: Obstetrics and Gynecology
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
## 2018-08-16 ENCOUNTER — Ambulatory Visit (INDEPENDENT_AMBULATORY_CARE_PROVIDER_SITE_OTHER): Payer: Medicare Other | Admitting: Obstetrics and Gynecology

## 2018-08-16 ENCOUNTER — Other Ambulatory Visit: Payer: Self-pay

## 2018-08-16 ENCOUNTER — Encounter: Payer: Self-pay | Admitting: Obstetrics and Gynecology

## 2018-08-16 VITALS — BP 148/86 | HR 80 | Resp 16 | Ht 65.5 in | Wt 198.4 lb

## 2018-08-16 DIAGNOSIS — M8580 Other specified disorders of bone density and structure, unspecified site: Secondary | ICD-10-CM | POA: Diagnosis not present

## 2018-08-16 DIAGNOSIS — Z01419 Encounter for gynecological examination (general) (routine) without abnormal findings: Secondary | ICD-10-CM | POA: Diagnosis not present

## 2018-08-16 DIAGNOSIS — Z780 Asymptomatic menopausal state: Secondary | ICD-10-CM

## 2018-08-16 DIAGNOSIS — N393 Stress incontinence (female) (male): Secondary | ICD-10-CM

## 2018-08-16 NOTE — Patient Instructions (Signed)

## 2018-08-23 ENCOUNTER — Ambulatory Visit
Admission: RE | Admit: 2018-08-23 | Discharge: 2018-08-23 | Disposition: A | Discharge status: Home / Self Care | Payer: Medicare Other | Source: Ambulatory Visit | Attending: Obstetrics and Gynecology | Admitting: Obstetrics and Gynecology

## 2018-08-23 DIAGNOSIS — Z1231 Encounter for screening mammogram for malignant neoplasm of breast: Secondary | ICD-10-CM

## 2018-08-30 ENCOUNTER — Telehealth: Payer: Self-pay | Admitting: Obstetrics and Gynecology

## 2018-08-30 NOTE — Telephone Encounter (Signed)
Call placed in reference for referral to [facility].

## 2018-09-08 NOTE — Telephone Encounter (Signed)
Call placed in reference for referral to [facility].

## 2018-09-28 NOTE — Telephone Encounter (Signed)
°  Spoke with [place] at [facility] -she has called the patient multiple times and left a message for scheduling. I have called and left a message for a return call. Per Dr. Quincy Simmonds- ok close to close the referral

## 2018-10-24 ENCOUNTER — Ambulatory Visit
Admission: RE | Admit: 2018-10-24 | Discharge: 2018-10-24 | Disposition: A | Discharge status: Home / Self Care | Payer: Medicare Other | Source: Ambulatory Visit | Attending: Obstetrics and Gynecology | Admitting: Obstetrics and Gynecology

## 2018-10-24 DIAGNOSIS — M8580 Other specified disorders of bone density and structure, unspecified site: Secondary | ICD-10-CM

## 2018-10-24 DIAGNOSIS — Z780 Asymptomatic menopausal state: Secondary | ICD-10-CM

## 2019-01-26 ENCOUNTER — Other Ambulatory Visit: Payer: Self-pay | Admitting: Obstetrics and Gynecology

## 2019-01-26 NOTE — Telephone Encounter (Signed)
Medication refill request: Nystatin  Last AEX:  08/16/18 Dr. Quincy Simmonds  Next AEX: 08/23/19  Last MMG (if hormonal medication request): 08/23/18 BIRADS1:neg  Refill authorized: 02/03/18 #[id number]. Today please advise.

## 2019-05-17 ENCOUNTER — Other Ambulatory Visit: Payer: Self-pay | Admitting: Gastroenterology

## 2019-06-22 NOTE — Progress Notes (Signed)
Pre-op endo call attempted. No answer. lmtcb 

## 2019-06-23 ENCOUNTER — Other Ambulatory Visit (HOSPITAL_COMMUNITY)
Admission: RE | Admit: 2019-06-23 | Discharge: 2019-06-23 | Disposition: A | Discharge status: Home / Self Care | Payer: Medicare Other | Source: Ambulatory Visit | Attending: Gastroenterology | Admitting: Gastroenterology

## 2019-06-23 ENCOUNTER — Encounter (HOSPITAL_COMMUNITY): Payer: Self-pay | Admitting: Emergency Medicine

## 2019-06-23 ENCOUNTER — Other Ambulatory Visit: Payer: Self-pay

## 2019-06-23 DIAGNOSIS — Z20828 Contact with and (suspected) exposure to other viral communicable diseases: Secondary | ICD-10-CM | POA: Insufficient documentation

## 2019-06-23 DIAGNOSIS — Z01812 Encounter for preprocedural laboratory examination: Secondary | ICD-10-CM | POA: Diagnosis present

## 2019-06-23 NOTE — Progress Notes (Signed)
Pre-op endo call completed .  Patient reports she is holding ASA x 5 days per Dr Collene Mares

## 2019-06-25 LAB — NOVEL CORONAVIRUS, NAA (HOSP ORDER, SEND-OUT TO REF LAB; TAT 18-24 HRS): SARS-CoV-2, NAA: NOT DETECTED

## 2019-06-26 NOTE — Anesthesia Preprocedure Evaluation (Addendum)
Anesthesia Evaluation  Patient identified by MRN, date of birth, ID band Patient awake    Reviewed: Allergy & Precautions, NPO status , Patient's Chart, lab work & pertinent test results  History of Anesthesia Complications (+) PONVNegative for: history of anesthetic complications  Airway Mallampati: II  TM Distance: >3 FB Neck ROM: Full    Dental  (+) Partial Upper   Pulmonary sleep apnea ,    Pulmonary exam normal        Cardiovascular hypertension, Normal cardiovascular exam     Neuro/Psych PSYCHIATRIC DISORDERS Anxiety negative neurological ROS     GI/Hepatic Neg liver ROS, GERD  ,  Endo/Other  diabetesHypothyroidism   Renal/GU negative Renal ROS  negative genitourinary   Musculoskeletal negative musculoskeletal ROS (+)   Abdominal   Peds  Hematology negative hematology ROS (+)   Anesthesia Other Findings   Reproductive/Obstetrics                            Anesthesia Physical Anesthesia Plan  ASA: III  Anesthesia Plan: MAC   Post-op Pain Management:    Induction: Intravenous  PONV Risk Score and Plan: 3 and Propofol infusion, TIVA and Treatment may vary due to age or medical condition  Airway Management Planned: Natural Airway, Nasal Cannula and Simple Face Mask  Additional Equipment: None  Intra-op Plan:   Post-operative Plan:   Informed Consent: I have reviewed the patients History and Physical, chart, labs and discussed the procedure including the risks, benefits and alternatives for the proposed anesthesia with the patient or authorized representative who has indicated his/her understanding and acceptance.       Plan Discussed with:   Anesthesia Plan Comments:        Anesthesia Quick Evaluation

## 2019-06-26 NOTE — Progress Notes (Signed)
Called to do precall to confirm for appointment in am. Left message

## 2019-06-27 ENCOUNTER — Ambulatory Visit (HOSPITAL_COMMUNITY)
Admission: RE | Admit: 2019-06-27 | Discharge: 2019-06-27 | Disposition: A | Discharge status: Home / Self Care | Payer: Medicare Other | Attending: Gastroenterology | Admitting: Gastroenterology

## 2019-06-27 ENCOUNTER — Encounter (HOSPITAL_COMMUNITY)
Admission: RE | Disposition: A | Discharge status: Home / Self Care | Payer: Self-pay | Source: Home / Self Care | Attending: Gastroenterology

## 2019-06-27 ENCOUNTER — Encounter (HOSPITAL_COMMUNITY): Payer: Self-pay | Admitting: *Deleted

## 2019-06-27 ENCOUNTER — Other Ambulatory Visit: Payer: Self-pay

## 2019-06-27 ENCOUNTER — Ambulatory Visit (HOSPITAL_COMMUNITY): Payer: Medicare Other | Admitting: Anesthesiology

## 2019-06-27 DIAGNOSIS — K219 Gastro-esophageal reflux disease without esophagitis: Secondary | ICD-10-CM | POA: Diagnosis not present

## 2019-06-27 DIAGNOSIS — Z7982 Long term (current) use of aspirin: Secondary | ICD-10-CM | POA: Insufficient documentation

## 2019-06-27 DIAGNOSIS — Z7984 Long term (current) use of oral hypoglycemic drugs: Secondary | ICD-10-CM | POA: Diagnosis not present

## 2019-06-27 DIAGNOSIS — Z1211 Encounter for screening for malignant neoplasm of colon: Secondary | ICD-10-CM | POA: Diagnosis not present

## 2019-06-27 DIAGNOSIS — Z86010 Personal history of colonic polyps: Secondary | ICD-10-CM | POA: Insufficient documentation

## 2019-06-27 DIAGNOSIS — E039 Hypothyroidism, unspecified: Secondary | ICD-10-CM | POA: Insufficient documentation

## 2019-06-27 DIAGNOSIS — I10 Essential (primary) hypertension: Secondary | ICD-10-CM | POA: Diagnosis not present

## 2019-06-27 DIAGNOSIS — Z79890 Hormone replacement therapy (postmenopausal): Secondary | ICD-10-CM | POA: Diagnosis not present

## 2019-06-27 DIAGNOSIS — E119 Type 2 diabetes mellitus without complications: Secondary | ICD-10-CM | POA: Insufficient documentation

## 2019-06-27 DIAGNOSIS — Z800 Family history of malignant neoplasm of digestive organs: Secondary | ICD-10-CM | POA: Insufficient documentation

## 2019-06-27 DIAGNOSIS — E785 Hyperlipidemia, unspecified: Secondary | ICD-10-CM | POA: Insufficient documentation

## 2019-06-27 DIAGNOSIS — G4730 Sleep apnea, unspecified: Secondary | ICD-10-CM | POA: Insufficient documentation

## 2019-06-27 DIAGNOSIS — Z79899 Other long term (current) drug therapy: Secondary | ICD-10-CM | POA: Insufficient documentation

## 2019-06-27 HISTORY — PX: COLONOSCOPY WITH PROPOFOL: SHX5780

## 2019-06-27 LAB — GLUCOSE, CAPILLARY: Glucose-Capillary: 113 mg/dL — ABNORMAL HIGH (ref 70–99)

## 2019-06-27 SURGERY — COLONOSCOPY WITH PROPOFOL
Anesthesia: Monitor Anesthesia Care

## 2019-06-27 MED ORDER — PROPOFOL 10 MG/ML IV BOLUS
INTRAVENOUS | Status: AC
  Filled 2019-06-27: qty 40

## 2019-06-27 MED ORDER — PROPOFOL 10 MG/ML IV BOLUS
INTRAVENOUS | Status: DC | PRN
  Administered 2019-06-27: 20 mg via INTRAVENOUS

## 2019-06-27 MED ORDER — LACTATED RINGERS IV SOLN
INTRAVENOUS | Status: DC
  Administered 2019-06-27: 1000 mL via INTRAVENOUS

## 2019-06-27 MED ORDER — ONDANSETRON HCL 4 MG/2ML IJ SOLN
INTRAMUSCULAR | Status: DC | PRN
  Administered 2019-06-27: 4 mg via INTRAVENOUS

## 2019-06-27 MED ORDER — SODIUM CHLORIDE 0.9 % IV SOLN: INTRAVENOUS | Status: DC

## 2019-06-27 MED ORDER — PROPOFOL 500 MG/50ML IV EMUL
INTRAVENOUS | Status: DC | PRN
  Administered 2019-06-27: 130 ug/kg/min via INTRAVENOUS

## 2019-06-27 SURGICAL SUPPLY — 22 items

## 2019-06-27 NOTE — Transfer of Care (Signed)
Immediate Anesthesia Transfer of Care Note  Patient: Kimberly Barker  Procedure(s) Performed: COLONOSCOPY WITH PROPOFOL (N/A )  Patient Location: PACU  Anesthesia Type:MAC  Level of Consciousness: awake, alert , oriented and patient cooperative  Airway & Oxygen Therapy: Patient Spontanous Breathing and Patient connected to face mask oxygen  Post-op Assessment: Report given to RN, Post -op Vital signs reviewed and stable and Patient moving all extremities  Post vital signs: Reviewed and stable  Last Vitals:  Vitals Value Taken Time  BP    Temp    Pulse 72 06/27/19 0745  Resp 17 06/27/19 0745  SpO2 100 % 06/27/19 0745  Vitals shown include unvalidated device data.  Last Pain:  Vitals:   06/27/19 0644  TempSrc: Oral  PainSc: 0-No pain         Complications: No apparent anesthesia complications
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
## 2019-06-27 NOTE — Anesthesia Postprocedure Evaluation (Signed)
Anesthesia Post Note  Patient: Kimberly Barker  Procedure(s) Performed: COLONOSCOPY WITH PROPOFOL (N/A )     Patient location during evaluation: Endoscopy Anesthesia Type: MAC Level of consciousness: awake and alert Pain management: pain level controlled Vital Signs Assessment: post-procedure vital signs reviewed and stable Respiratory status: spontaneous breathing, nonlabored ventilation and respiratory function stable Cardiovascular status: blood pressure returned to baseline and stable Postop Assessment: no apparent nausea or vomiting Anesthetic complications: no    Last Vitals:  Vitals:   06/27/19 0750 06/27/19 0751  BP:  121/61  Pulse: 70 72  Resp: 16 19  Temp:    SpO2: 100% 98%    Last Pain:  Vitals:   06/27/19 0751  TempSrc:   PainSc: 0-No pain                 Lidia Collum

## 2019-06-27 NOTE — Op Note (Signed)
[facility] Patient Name: Kimberly Barker Procedure Date: 06/27/2019 MRN: [id number] Attending MD: Juanita Craver , MD Date of Birth: 1951-01-11 CSN: [id number] Age: 68 Admit Type: Outpatient Procedure:                Screening colonoscopy. Indications:              Personal history of colonic polyps; CRC screening                            for colorectal malignant neoplasm. Providers:                Juanita Craver, MD, Elmer Ramp. Tilden Dome, RN, Josie Dixon, RN, Lina Sar, Technician, Courtney Heys.                            [place], CRNA Referring MD:             Megan Salon, MD & Jaymes Graff. Dough, MD Medicines:                Monitored Anesthesia Care Complications:            No immediate complications. Estimated Blood Loss:     Estimated blood loss: none. Procedure:                Pre-Anesthesia Assessment: - Prior to the                            procedure, a history and physical was performed,                            and patient medications and allergies were                            reviewed. The patient's tolerance of previous                            anesthesia was also reviewed. The risks and                            benefits of the procedure and the sedation options                            and risks were discussed with the patient. All                            questions were answered, and informed consent was                            obtained. Prior Anticoagulants: The patient has                            taken no previous anticoagulant or antiplatelet  agents. ASA Grade Assessment: II - A patient with                            mild systemic disease. After reviewing the risks                            and benefits, the patient was deemed in                            satisfactory condition to undergo the procedure.                            After obtaining informed consent, the colonoscope                             was passed under direct vision. Throughout the                            procedure, the patient's blood pressure, pulse, and                            oxygen saturations were monitored continuously. The                            CF-HQ190L ([id number]) Olympus colonoscope was                            introduced through the anus and advanced to the the                            cecum, identified by appendiceal orifice and                            ileocecal valve. The colonoscopy was performed                            without difficulty. The patient tolerated the                            procedure well. The quality of the bowel                            preparation was adequate. The ileocecal valve, the                            appendiceal orifice and the rectum were                            photographed. The bowel preparation used was                            GoLYTELY via split dose instruction. Scope In: 7:23:39 AM Scope Out: 7:38:15 AM Scope Withdrawal Time: 0 hours 9 minutes 24 seconds  Total Procedure Duration: 0  hours 14 minutes 36 seconds  Findings:      The entire examined portion of the colon appeared normal.      No additional abnormalities were found on retroflexion. Impression:               - The entire examined colon is normal.                           - No specimens collected. Moderate Sedation:      MAC used. Recommendation:           - High fiber diet with augmented water consumption                            daily.                           - Continue present medications.                           - Repeat colonoscopy in 7 years for surveillance.                           - Return to GI office PRN.                           - If the patient has any abnormal GI symptoms in                            the interim, she has been advised to call the                            office ASAP for further recommendations. Procedure  Code(s):        --- Professional ---                           [phone], Colonoscopy, flexible; diagnostic, including                            collection of specimen(s) by brushing or washing,                            when performed (separate procedure) Diagnosis Code(s):        --- Professional ---                           Z86.010, Personal history of colonic polyps                           Z12.11, Encounter for screening for malignant                            neoplasm of colon CPT copyright 2019 [employer]. All rights reserved. The codes documented in this report are preliminary and upon coder review may  be revised to meet current compliance requirements. Juanita Craver, MD Juanita Craver, MD 06/27/2019 7:51:01 AM This report has been signed  electronically. Number of Addenda: 0

## 2019-06-27 NOTE — Discharge Instructions (Signed)

## 2019-06-28 ENCOUNTER — Encounter (HOSPITAL_COMMUNITY): Payer: Self-pay | Admitting: Gastroenterology

## 2019-08-22 ENCOUNTER — Other Ambulatory Visit: Payer: Self-pay

## 2019-08-23 ENCOUNTER — Ambulatory Visit: Payer: Medicare Other | Admitting: Obstetrics and Gynecology

## 2019-08-24 ENCOUNTER — Other Ambulatory Visit: Payer: Self-pay

## 2019-08-28 ENCOUNTER — Ambulatory Visit (INDEPENDENT_AMBULATORY_CARE_PROVIDER_SITE_OTHER): Payer: Medicare Other | Admitting: Obstetrics and Gynecology

## 2019-08-28 ENCOUNTER — Other Ambulatory Visit: Payer: Self-pay

## 2019-08-28 ENCOUNTER — Encounter: Payer: Self-pay | Admitting: Obstetrics and Gynecology

## 2019-08-28 VITALS — BP 150/78 | HR 80 | Temp 96.8°F | Resp 18 | Ht 65.25 in | Wt 200.6 lb

## 2019-08-28 DIAGNOSIS — N762 Acute vulvitis: Secondary | ICD-10-CM

## 2019-08-28 DIAGNOSIS — Z01419 Encounter for gynecological examination (general) (routine) without abnormal findings: Secondary | ICD-10-CM | POA: Diagnosis not present

## 2019-08-28 MED ORDER — NYSTATIN-TRIAMCINOLONE 100000-0.1 UNIT/GM-% EX OINT: 1.0000 "application " | TOPICAL_OINTMENT | Freq: Two times a day (BID) | CUTANEOUS | 0 refills | Status: DC

## 2019-08-28 MED ORDER — NYSTATIN 100000 UNIT/GM EX POWD: Freq: Three times a day (TID) | CUTANEOUS | 3 refills | Status: DC

## 2019-08-28 NOTE — Progress Notes (Signed)
Copy of Medicare ABN for Well woman visit given to patient today.
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
## 2019-08-28 NOTE — Patient Instructions (Signed)

## 2019-08-29 ENCOUNTER — Other Ambulatory Visit: Payer: Self-pay | Admitting: *Deleted

## 2019-08-29 LAB — VAGINITIS/VAGINOSIS, DNA PROBE
Candida Species: POSITIVE — AB
Gardnerella vaginalis: NEGATIVE
Trichomonas vaginosis: NEGATIVE

## 2019-08-29 MED ORDER — FLUCONAZOLE 150 MG PO TABS: ORAL_TABLET | ORAL | 0 refills | Status: DC

## 2019-10-12 ENCOUNTER — Ambulatory Visit: Payer: Medicare Other | Admitting: Obstetrics and Gynecology

## 2019-10-23 ENCOUNTER — Other Ambulatory Visit: Payer: Self-pay | Admitting: Obstetrics and Gynecology

## 2019-10-23 DIAGNOSIS — Z1231 Encounter for screening mammogram for malignant neoplasm of breast: Secondary | ICD-10-CM

## 2019-11-30 ENCOUNTER — Ambulatory Visit
Admission: RE | Admit: 2019-11-30 | Discharge: 2019-11-30 | Disposition: A | Discharge status: Home / Self Care | Payer: Medicare Other | Source: Ambulatory Visit | Attending: Obstetrics and Gynecology | Admitting: Obstetrics and Gynecology

## 2019-11-30 ENCOUNTER — Other Ambulatory Visit: Payer: Self-pay

## 2019-11-30 DIAGNOSIS — Z1231 Encounter for screening mammogram for malignant neoplasm of breast: Secondary | ICD-10-CM

## 2020-08-26 ENCOUNTER — Other Ambulatory Visit: Payer: Self-pay

## 2020-08-26 NOTE — Telephone Encounter (Signed)
Patient is calling for refill of Nystatin to be sent to [facility] pharmacy.

## 2020-08-27 MED ORDER — NYSTATIN 100000 UNIT/GM EX POWD: Freq: Three times a day (TID) | CUTANEOUS | 0 refills | Status: DC

## 2020-08-27 NOTE — Telephone Encounter (Signed)
Medication refill request: Nystatin  Last AEX:  08-28-2019 BS Next AEX: 10-02-20  Last MMG (if hormonal medication request): n/a Refill authorized: Today, please advise.   Medication pended for #30g, 0RF. Please refill if appropriate.
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
## 2020-10-02 ENCOUNTER — Ambulatory Visit: Payer: Medicare PPO | Admitting: Obstetrics and Gynecology

## 2020-10-02 ENCOUNTER — Telehealth: Payer: Self-pay | Admitting: Obstetrics and Gynecology

## 2020-10-02 ENCOUNTER — Encounter: Payer: Self-pay | Admitting: Obstetrics and Gynecology

## 2020-10-02 ENCOUNTER — Other Ambulatory Visit: Payer: Self-pay

## 2020-10-02 VITALS — BP 160/82 | HR 90 | Ht 64.0 in | Wt 196.0 lb

## 2020-10-02 DIAGNOSIS — N8189 Other female genital prolapse: Secondary | ICD-10-CM

## 2020-10-02 DIAGNOSIS — E559 Vitamin D deficiency, unspecified: Secondary | ICD-10-CM | POA: Diagnosis not present

## 2020-10-02 DIAGNOSIS — N3946 Mixed incontinence: Secondary | ICD-10-CM

## 2020-10-02 DIAGNOSIS — N763 Subacute and chronic vulvitis: Secondary | ICD-10-CM | POA: Diagnosis not present

## 2020-10-02 DIAGNOSIS — Z780 Asymptomatic menopausal state: Secondary | ICD-10-CM | POA: Diagnosis not present

## 2020-10-02 DIAGNOSIS — Z1239 Encounter for other screening for malignant neoplasm of breast: Secondary | ICD-10-CM

## 2020-10-02 DIAGNOSIS — Z01411 Encounter for gynecological examination (general) (routine) with abnormal findings: Secondary | ICD-10-CM | POA: Diagnosis not present

## 2020-10-02 MED ORDER — NYSTATIN 100000 UNIT/GM EX POWD: Freq: Three times a day (TID) | CUTANEOUS | 2 refills | Status: DC

## 2020-10-02 MED ORDER — NYSTATIN-TRIAMCINOLONE 100000-0.1 UNIT/GM-% EX OINT: 1.0000 "application " | TOPICAL_OINTMENT | Freq: Two times a day (BID) | CUTANEOUS | 2 refills | Status: DC

## 2020-10-02 NOTE — Telephone Encounter (Addendum)
Referral placed at [facility] physical therapy they will call to schedule. Patient informed of this as well.

## 2020-10-02 NOTE — Telephone Encounter (Signed)
Please send in a referral for patient to see Austin Miles, [place] for pelvic floor therapy for mixed urinary incontinence.

## 2020-10-02 NOTE — Patient Instructions (Signed)
EXERCISE AND DIET:  We recommended that you start or continue a regular exercise program for good health. Regular exercise means any activity that makes your heart beat faster and makes you sweat.  We recommend exercising at least 30 minutes per day at least 3 days a week, preferably 4 or 5.  We also recommend a diet low in fat and sugar.  Inactivity, poor dietary choices and obesity can cause diabetes, heart attack, stroke, and kidney damage, among others.    ALCOHOL AND SMOKING:  Women should limit their alcohol intake to no more than 7 drinks/beers/glasses of wine (combined, not each!) per week. Moderation of alcohol intake to this level decreases your risk of breast cancer and liver damage. And of course, no recreational drugs are part of a healthy lifestyle.  And absolutely no smoking or even second hand smoke. Most people know smoking can cause heart and lung diseases, but did you know it also contributes to weakening of your bones? Aging of your skin?  Yellowing of your teeth and nails?  CALCIUM AND VITAMIN D:  Adequate intake of calcium and Vitamin D are recommended.  The recommendations for exact amounts of these supplements seem to change often, but generally speaking 600 mg of calcium (either carbonate or citrate) and 800 units of Vitamin D per day seems prudent. Certain women may benefit from higher intake of Vitamin D.  If you are among these women, your doctor will have told you during your visit.    PAP SMEARS:  Pap smears, to check for cervical cancer or precancers,  have traditionally been done yearly, although recent scientific advances have shown that most women can have pap smears less often.  However, every woman still should have a physical exam from her gynecologist every year. It will include a breast check, inspection of the vulva and vagina to check for abnormal growths or skin changes, a visual exam of the cervix, and then an exam to evaluate the size and shape of the uterus and  ovaries.  And after 70 years of age, a rectal exam is indicated to check for rectal cancers. We will also provide age appropriate advice regarding health maintenance, like when you should have certain vaccines, screening for sexually transmitted diseases, bone density testing, colonoscopy, mammograms, etc.   MAMMOGRAMS:  All women over 40 years old should have a yearly mammogram. Many facilities now offer a "3D" mammogram, which may cost around $50 extra out of pocket. If possible,  we recommend you accept the option to have the 3D mammogram performed.  It both reduces the number of women who will be called back for extra views which then turn out to be normal, and it is better than the routine mammogram at detecting truly abnormal areas.    COLONOSCOPY:  Colonoscopy to screen for colon cancer is recommended for all women at age 50.  We know, you hate the idea of the prep.  We agree, BUT, having colon cancer and not knowing it is worse!!  Colon cancer so often starts as a polyp that can be seen and removed at colonscopy, which can quite literally save your life!  And if your first colonoscopy is normal and you have no family history of colon cancer, most women don't have to have it again for 10 years.  Once every ten years, you can do something that may end up saving your life, right?  We will be happy to help you get it scheduled when you are ready.    Be sure to check your insurance coverage so you understand how much it will cost.  It may be covered as a preventative service at no cost, but you should check your particular policy.      Urinary Incontinence  Urinary incontinence refers to a condition in which a person is unable to control where and when to pass urine. A person with this condition will urinate when he or she does not mean to (involuntarily). What are the causes? This condition may be caused by:  Medicines.  Infections.  Constipation.  Overactive bladder muscles.  Weak bladder  muscles.  Weak pelvic floor muscles. These muscles provide support for the bladder, intestine, and, in women, the uterus.  Enlarged prostate in men. The prostate is a gland near the bladder. When it gets too big, it can pinch the urethra. With the urethra blocked, the bladder can weaken and lose the ability to empty properly.  Surgery.  Emotional factors, such as anxiety, stress, or post-traumatic stress disorder (PTSD).  Pelvic organ prolapse. This happens in women when organs shift out of place and into the vagina. This shift can prevent the bladder and urethra from working properly. What increases the risk? The following factors may make you more likely to develop this condition:  Older age.  Obesity and physical inactivity.  Pregnancy and childbirth.  Menopause.  Diseases that affect the nerves or spinal cord (neurological diseases).  Long-term (chronic) coughing. This can increase pressure on the bladder and pelvic floor muscles. What are the signs or symptoms? Symptoms may vary depending on the type of urinary incontinence you have. They include:  A sudden urge to urinate, but passing urine involuntarily before you can get to a bathroom (urge incontinence).  Suddenly passing urine with any activity that forces urine to pass, such as coughing, laughing, exercise, or sneezing (stress incontinence).  Needing to urinate often, but urinating only a small amount, or constantly dribbling urine (overflow incontinence).  Urinating because you cannot get to the bathroom in time due to a physical disability, such as arthritis or injury, or communication and thinking problems, such as Alzheimer disease (functional incontinence). How is this diagnosed? This condition may be diagnosed based on:  Your medical history.  A physical exam.  Tests, such as: ? Urine tests. ? X-rays of your kidney and bladder. ? Ultrasound. ? CT scan. ? Cystoscopy. In this procedure, a health care  provider inserts a tube with a light and camera (cystoscope) through the urethra and into the bladder in order to check for problems. ? Urodynamic testing. These tests assess how well the bladder, urethra, and sphincter can store and release urine. There are different types of urodynamic tests, and they vary depending on what the test is measuring. To help diagnose your condition, your health care provider may recommend that you keep a log of when you urinate and how much you urinate. How is this treated? Treatment for this condition depends on the type of incontinence that you have and its cause. Treatment may include:  Lifestyle changes, such as: ? Quitting smoking. ? Maintaining a healthy weight. ? Staying active. Try to get 150 minutes of moderate-intensity exercise every week. Ask your health care provider which activities are safe for you. ? Eating a healthy diet.  Avoid high-fat foods, like fried foods.  Avoid refined carbohydrates like white bread and white rice.  Limit how much alcohol and caffeine you drink.  Increase your fiber intake. Foods such as fresh fruits, vegetables, beans,  and whole grains are healthy sources of fiber.  Pelvic floor muscle exercises.  Bladder training, such as lengthening the amount of time between bathroom breaks, or using the bathroom at regular intervals.  Using techniques to suppress bladder urges. This can include distraction techniques or controlled breathing exercises.  Medicines to relax the bladder muscles and prevent bladder spasms.  Medicines to help slow or prevent the growth of a man's prostate.  Botox injections. These can help relax the bladder muscles.  Using pulses of electricity to help change bladder reflexes (electrical nerve stimulation).  For women, using a medical device to prevent urine leaks. This is a small, tampon-like, disposable device that is inserted into the urethra.  Injecting collagen or carbon beads (bulking  agents) into the urinary sphincter. These can help thicken tissue and close the bladder opening.  Surgery. Follow these instructions at home: Lifestyle  Limit alcohol and caffeine. These can fill your bladder quickly and irritate it.  Keep yourself clean to help prevent odors and skin damage. Ask your doctor about special skin creams and cleansers that can protect the skin from urine.  Consider wearing pads or adult diapers. Make sure to change them regularly, and always change them right after experiencing incontinence. General instructions  Take over-the-counter and prescription medicines only as told by your health care provider.  Use the bathroom about every 3-4 hours, even if you do not feel the need to urinate. Try to empty your bladder completely every time. After urinating, wait a minute. Then try to urinate again.  Make sure you are in a relaxed position while urinating.  If your incontinence is caused by nerve problems, keep a log of the medicines you take and the times you go to the bathroom.  Keep all follow-up visits as told by your health care provider. This is important. Contact a health care provider if:  You have pain that gets worse.  Your incontinence gets worse. Get help right away if:  You have a fever or chills.  You are unable to urinate.  You have redness in your groin area or down your legs. Summary  Urinary incontinence refers to a condition in which a person is unable to control where and when to pass urine.  This condition may be caused by medicines, infection, weak bladder muscles, weak pelvic floor muscles, enlargement of the prostate (in men), or surgery.  The following factors increase your risk for developing this condition: older age, obesity, pregnancy and childbirth, menopause, neurological diseases, and chronic coughing.  There are several types of urinary incontinence. They include urge incontinence, stress incontinence, overflow  incontinence, and functional incontinence.  This condition is usually treated first with lifestyle and behavioral changes, such as quitting smoking, eating a healthier diet, and doing regular pelvic floor exercises. Other treatment options include medicines, bulking agents, medical devices, electrical nerve stimulation, or surgery. This information is not intended to replace advice given to you by your health care provider. Make sure you discuss any questions you have with your health care provider. Document Revised: 09/17/2017 Document Reviewed: 12/17/2016 Elsevier Patient Education  [employer].

## 2020-10-03 LAB — VITAMIN D 25 HYDROXY (VIT D DEFICIENCY, FRACTURES): Vit D, 25-Hydroxy: 35 ng/mL (ref 30–100)

## 2020-10-18 ENCOUNTER — Other Ambulatory Visit: Payer: Self-pay | Admitting: Obstetrics and Gynecology

## 2020-10-18 DIAGNOSIS — Z1231 Encounter for screening mammogram for malignant neoplasm of breast: Secondary | ICD-10-CM

## 2020-10-21 ENCOUNTER — Other Ambulatory Visit: Payer: Self-pay | Admitting: Obstetrics and Gynecology

## 2020-10-21 DIAGNOSIS — E2839 Other primary ovarian failure: Secondary | ICD-10-CM

## 2020-10-21 NOTE — Progress Notes (Signed)
Bone density ordered for estrogen deficiency and osteopenia history.

## 2021-03-12 ENCOUNTER — Ambulatory Visit
Admission: RE | Admit: 2021-03-12 | Discharge: 2021-03-12 | Disposition: A | Discharge status: Home / Self Care | Payer: Medicare PPO | Source: Ambulatory Visit | Attending: Obstetrics and Gynecology | Admitting: Obstetrics and Gynecology

## 2021-03-12 ENCOUNTER — Ambulatory Visit: Payer: Medicare PPO

## 2021-03-12 ENCOUNTER — Other Ambulatory Visit: Payer: Self-pay

## 2021-03-12 DIAGNOSIS — E2839 Other primary ovarian failure: Secondary | ICD-10-CM

## 2021-03-12 DIAGNOSIS — Z1231 Encounter for screening mammogram for malignant neoplasm of breast: Secondary | ICD-10-CM

## 2021-07-22 ENCOUNTER — Telehealth: Payer: Self-pay | Admitting: *Deleted

## 2021-07-22 DIAGNOSIS — N3946 Mixed incontinence: Secondary | ICD-10-CM

## 2021-07-22 DIAGNOSIS — N8189 Other female genital prolapse: Secondary | ICD-10-CM

## 2021-07-22 NOTE — Telephone Encounter (Signed)
Patient scheduled on 07/30/21 with Monico Hoar, PT

## 2021-07-22 NOTE — Telephone Encounter (Signed)
Patient called stating she never heard back from PT pelvic floor therapy appointment from Jan 2022. I see documentation they called x 3 and referral was closed. New referral placed number given to patient to call so she can schedule.

## 2021-07-30 ENCOUNTER — Other Ambulatory Visit: Payer: Self-pay

## 2021-07-30 ENCOUNTER — Ambulatory Visit: Payer: Medicare PPO | Attending: Obstetrics and Gynecology | Admitting: Physical Therapy

## 2021-07-30 ENCOUNTER — Encounter: Payer: Self-pay | Admitting: Physical Therapy

## 2021-07-30 DIAGNOSIS — R278 Other lack of coordination: Secondary | ICD-10-CM | POA: Insufficient documentation

## 2021-07-30 DIAGNOSIS — N3946 Mixed incontinence: Secondary | ICD-10-CM | POA: Diagnosis not present

## 2021-07-30 DIAGNOSIS — N8189 Other female genital prolapse: Secondary | ICD-10-CM | POA: Diagnosis not present

## 2021-07-30 DIAGNOSIS — M6281 Muscle weakness (generalized): Secondary | ICD-10-CM | POA: Diagnosis present
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
## 2021-08-11 ENCOUNTER — Encounter: Payer: Self-pay | Admitting: Physical Therapy

## 2021-08-11 ENCOUNTER — Ambulatory Visit: Payer: Medicare PPO | Admitting: Physical Therapy

## 2021-08-11 ENCOUNTER — Other Ambulatory Visit: Payer: Self-pay

## 2021-08-11 DIAGNOSIS — M6281 Muscle weakness (generalized): Secondary | ICD-10-CM

## 2021-08-11 DIAGNOSIS — R278 Other lack of coordination: Secondary | ICD-10-CM

## 2021-08-11 NOTE — Patient Instructions (Addendum)
Moisturizers They are used in the vagina to hydrate the mucous membrane that make up the vaginal canal. Designed to keep a more normal acid balance (ph) Once placed in the vagina, it will last between two to three days.  Use 2-3 times per week at bedtime  Ingredients to avoid is glycerin and fragrance, can increase chance of infection Should not be used just before sex due to causing irritation Most are gels administered either in a tampon-shaped applicator or as a vaginal suppository. They are non-hormonal.   Types of Moisturizers(internal use)  Vitamin E vaginal suppositories- Whole foods, Amazon Moist Again Coconut oil- can break down condoms Julva- (Do no use if on Tamoxifen) amazon Yes moisturizer- amazon NeuEve Silk , NeuEve Silver for menopausal or over 65 (if have severe vaginal atrophy or cancer treatments use NeuEve Silk for  1 month than move to [employer])- [employer], [place].com Olive and Bee intimate cream- www.oliveandbee.com.au Mae vaginal moisturizer- Amazon Aloe    Creams to use externally on the Vulva area Albertson's (good for for cancer patients that had radiation to the area)- [place] or [employer].FlyingBasics.com.br V-magic cream - amazon Julva-amazon Vital "V [place] salve ( help moisturize and help with thinning vulvar area, does have Morris by Irwin Brakeman labial moisturizer (Amazon,  Coconut or olive oil aloe   Things to avoid in the vaginal area Do not use things to irritate the vulvar area No lotions just specialized creams for the vulva area- Neogyn, V-magic, No soaps; can use Aveeno or Calendula cleanser if needed. Must be gentle No deodorants No douches Good to sleep without underwear to let the vaginal area to air out. If not able to place a towel under you and air out the vaginal area for 30 minutes No scrubbing: spread the lips to let warm water rinse over labias and  pat dry   Access Code: [email or web address] URL: [email or web address] Date: 08/11/2021 Prepared by: Earlie Counts  Exercises Supine Diaphragmatic Breathing - 2 x daily - 7 x weekly - 1 sets - 10 reps Supine Pelvic Floor Contraction - 3 x daily - 7 x weekly - 1 sets - 10 reps - 3 sec hold  [facility] [address], [place], [place] [address] Phone # [phone] Fax [phone]
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
## 2021-08-18 ENCOUNTER — Ambulatory Visit: Payer: Medicare PPO | Admitting: Physical Therapy

## 2021-08-18 ENCOUNTER — Other Ambulatory Visit: Payer: Self-pay

## 2021-08-18 DIAGNOSIS — R278 Other lack of coordination: Secondary | ICD-10-CM

## 2021-08-18 DIAGNOSIS — M6281 Muscle weakness (generalized): Secondary | ICD-10-CM
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
## 2021-08-25 ENCOUNTER — Encounter: Payer: Medicare PPO | Admitting: Physical Therapy

## 2021-09-01 ENCOUNTER — Encounter: Payer: Self-pay | Admitting: Physical Therapy

## 2021-09-01 ENCOUNTER — Ambulatory Visit: Payer: Medicare PPO | Attending: Obstetrics and Gynecology | Admitting: Physical Therapy

## 2021-09-01 ENCOUNTER — Other Ambulatory Visit: Payer: Self-pay

## 2021-09-01 DIAGNOSIS — M6281 Muscle weakness (generalized): Secondary | ICD-10-CM | POA: Diagnosis not present

## 2021-09-01 DIAGNOSIS — R278 Other lack of coordination: Secondary | ICD-10-CM | POA: Diagnosis present
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
## 2021-09-03 ENCOUNTER — Encounter: Payer: Medicare PPO | Admitting: Physical Therapy

## 2021-09-03 DIAGNOSIS — D509 Iron deficiency anemia, unspecified: Secondary | ICD-10-CM | POA: Insufficient documentation

## 2021-09-08 ENCOUNTER — Encounter: Payer: Medicare PPO | Admitting: Physical Therapy

## 2021-09-19 ENCOUNTER — Ambulatory Visit: Payer: Medicare PPO | Admitting: Physical Therapy

## 2021-09-26 ENCOUNTER — Encounter: Payer: Medicare PPO | Admitting: Physical Therapy

## 2021-09-29 ENCOUNTER — Encounter: Payer: Medicare PPO | Admitting: Physical Therapy

## 2021-10-03 ENCOUNTER — Encounter: Payer: Medicare PPO | Admitting: Physical Therapy

## 2021-10-06 ENCOUNTER — Encounter: Payer: Medicare PPO | Admitting: Physical Therapy

## 2021-10-08 ENCOUNTER — Other Ambulatory Visit: Payer: Self-pay

## 2021-10-08 ENCOUNTER — Ambulatory Visit (INDEPENDENT_AMBULATORY_CARE_PROVIDER_SITE_OTHER): Payer: Medicare PPO | Admitting: Obstetrics and Gynecology

## 2021-10-08 ENCOUNTER — Encounter: Payer: Self-pay | Admitting: Obstetrics and Gynecology

## 2021-10-08 VITALS — BP 140/70 | HR 97 | Ht 65.0 in | Wt 197.0 lb

## 2021-10-08 DIAGNOSIS — Z01419 Encounter for gynecological examination (general) (routine) without abnormal findings: Secondary | ICD-10-CM

## 2021-10-08 DIAGNOSIS — D649 Anemia, unspecified: Secondary | ICD-10-CM | POA: Diagnosis not present

## 2021-10-08 DIAGNOSIS — N3946 Mixed incontinence: Secondary | ICD-10-CM

## 2021-10-08 DIAGNOSIS — E559 Vitamin D deficiency, unspecified: Secondary | ICD-10-CM | POA: Diagnosis not present

## 2021-10-08 DIAGNOSIS — M8580 Other specified disorders of bone density and structure, unspecified site: Secondary | ICD-10-CM | POA: Diagnosis not present
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
## 2021-10-08 NOTE — Patient Instructions (Signed)

## 2021-10-10 ENCOUNTER — Encounter: Payer: Medicare PPO | Admitting: Physical Therapy

## 2021-10-13 ENCOUNTER — Encounter: Payer: Medicare PPO | Admitting: Physical Therapy

## 2021-10-17 ENCOUNTER — Encounter: Payer: Medicare PPO | Admitting: Physical Therapy

## 2021-11-04 DIAGNOSIS — D539 Nutritional anemia, unspecified: Secondary | ICD-10-CM | POA: Insufficient documentation

## 2021-11-06 IMAGING — MG MM DIGITAL SCREENING BILAT W/ TOMO AND CAD
6 of 12 series · 6 of 36 positions shown · non-contrast
Comparison: Previous exam(s).

CLINICAL DATA: Screening.

EXAM:
DIGITAL SCREENING BILATERAL MAMMOGRAM WITH TOMOSYNTHESIS AND CAD
TECHNIQUE: Bilateral screening digital craniocaudal and mediolateral oblique
mammograms were obtained. Bilateral screening digital breast
tomosynthesis was performed. The images were evaluated with
computer-aided detection.

[L CC synth-2D (1 of 2)]
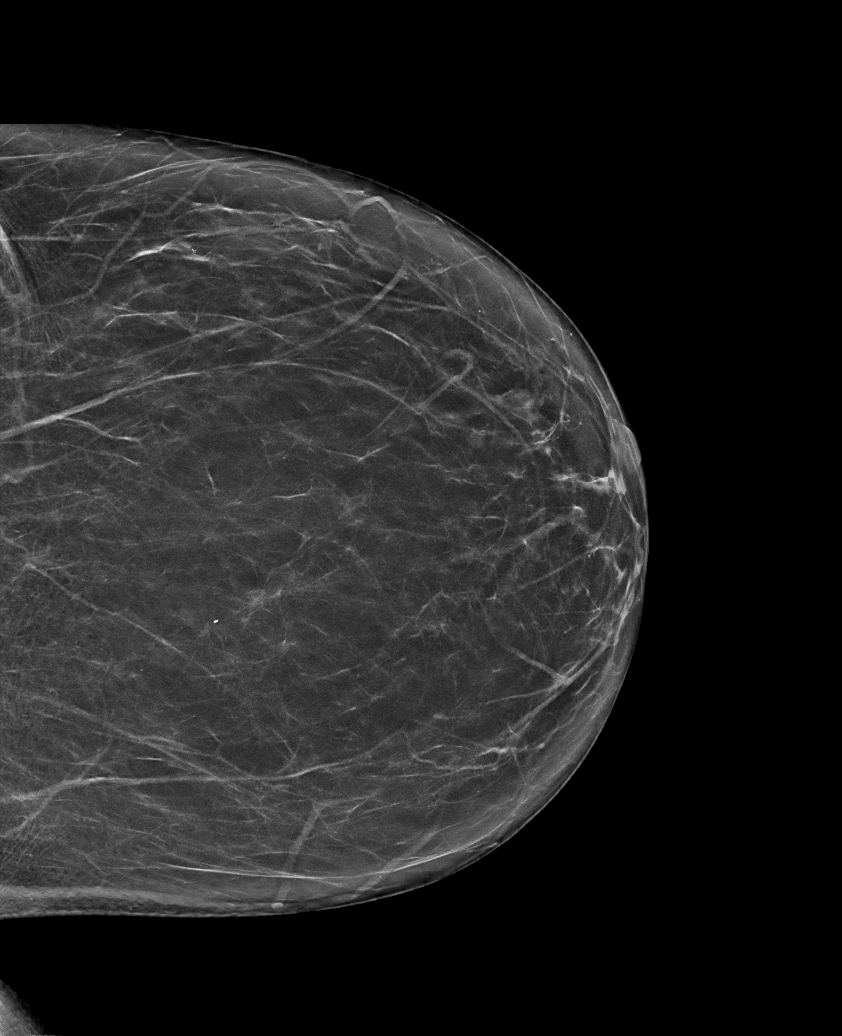

[R CC synth-2D (1 of 2)]
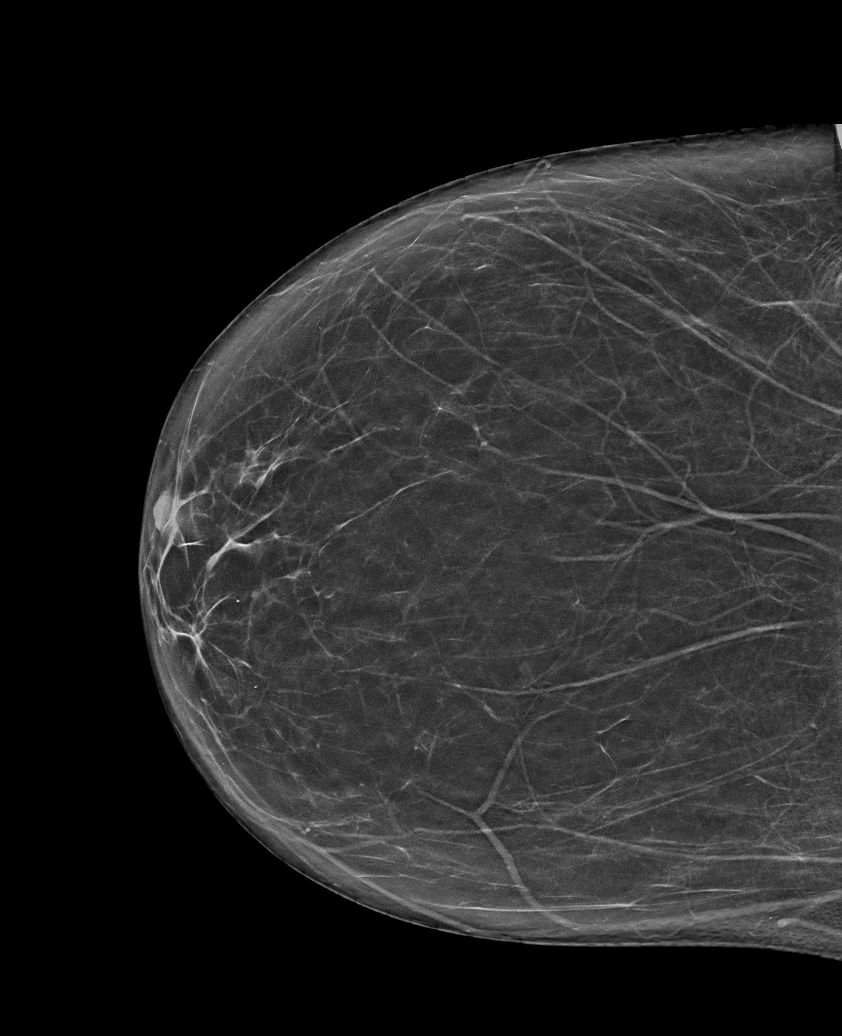

[R CC synth-2D (2 of 2)]
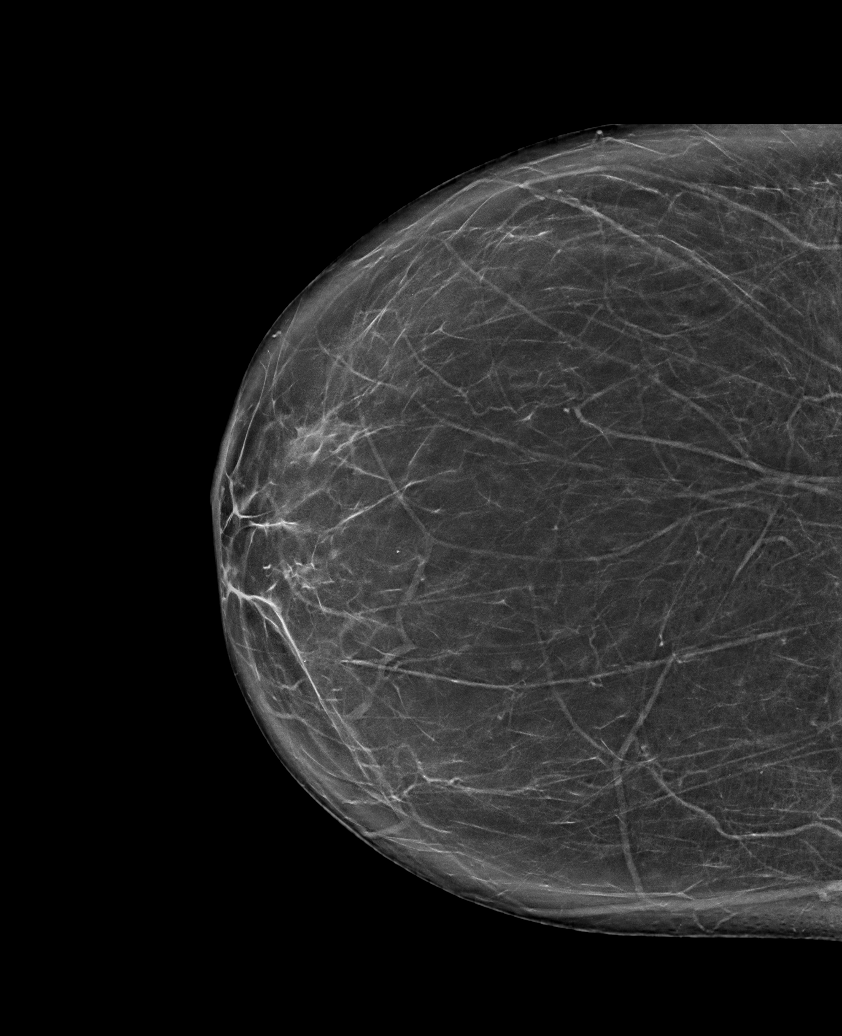

[R MLO synth-2D]
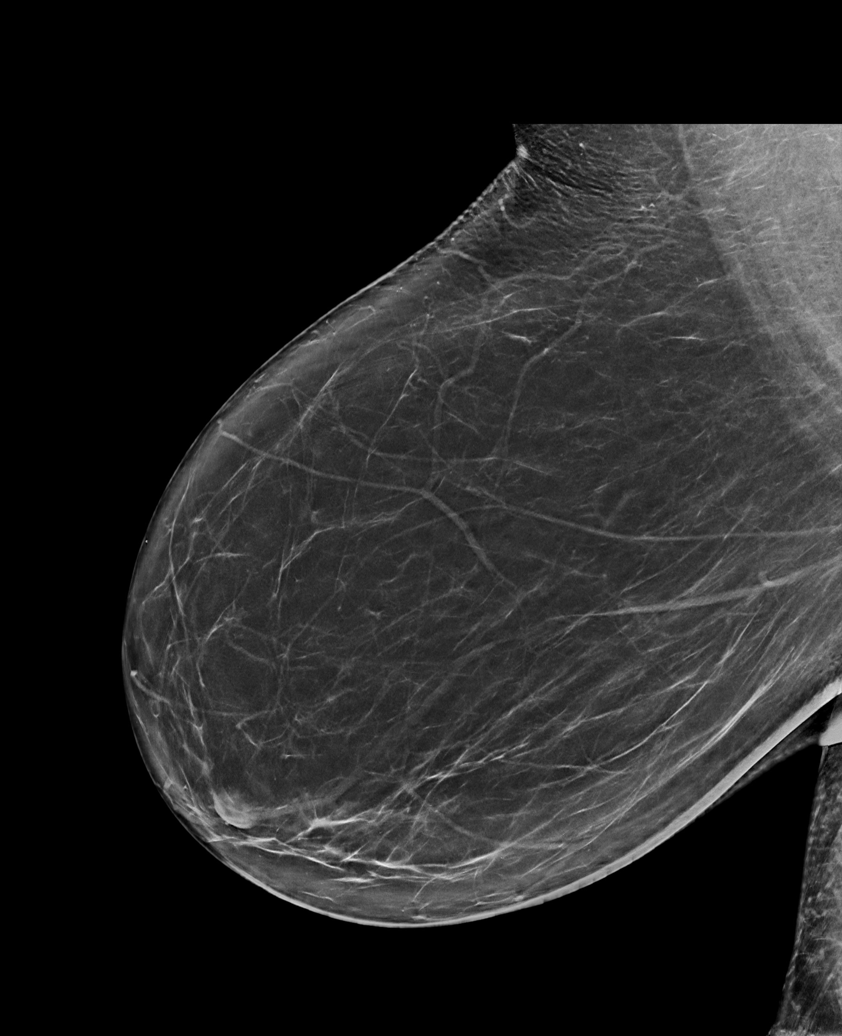

[L MLO synth-2D]
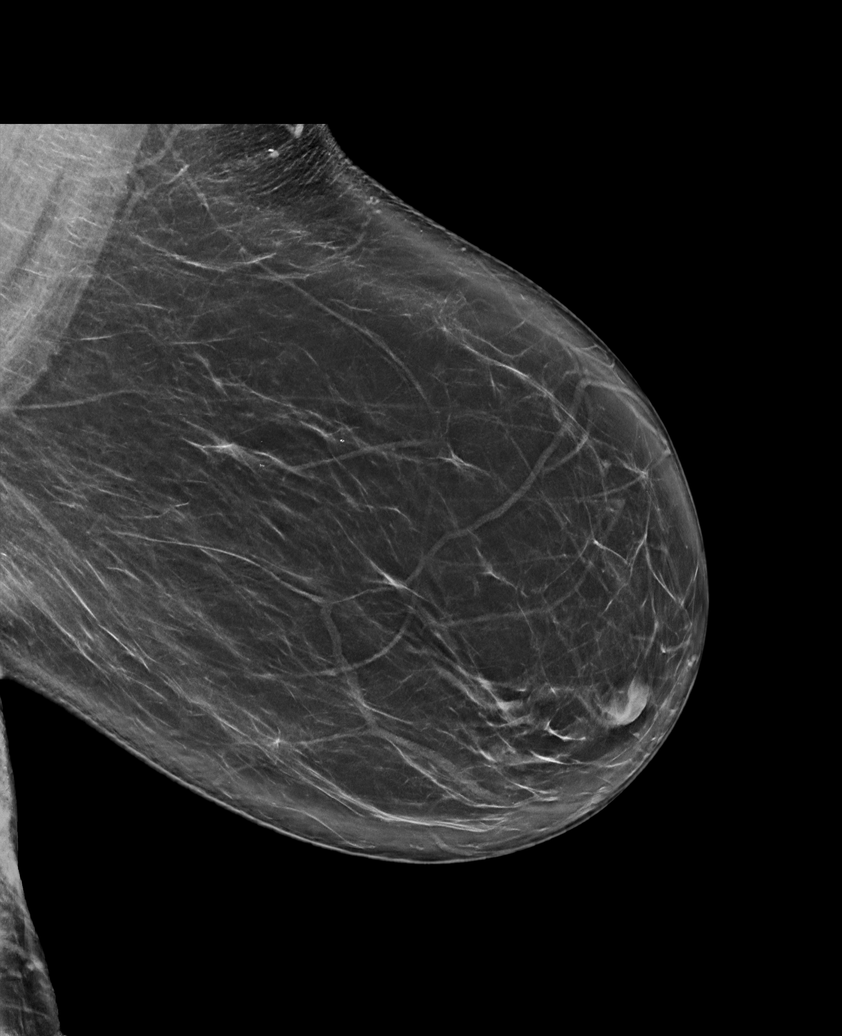

[L CC synth-2D (2 of 2)]
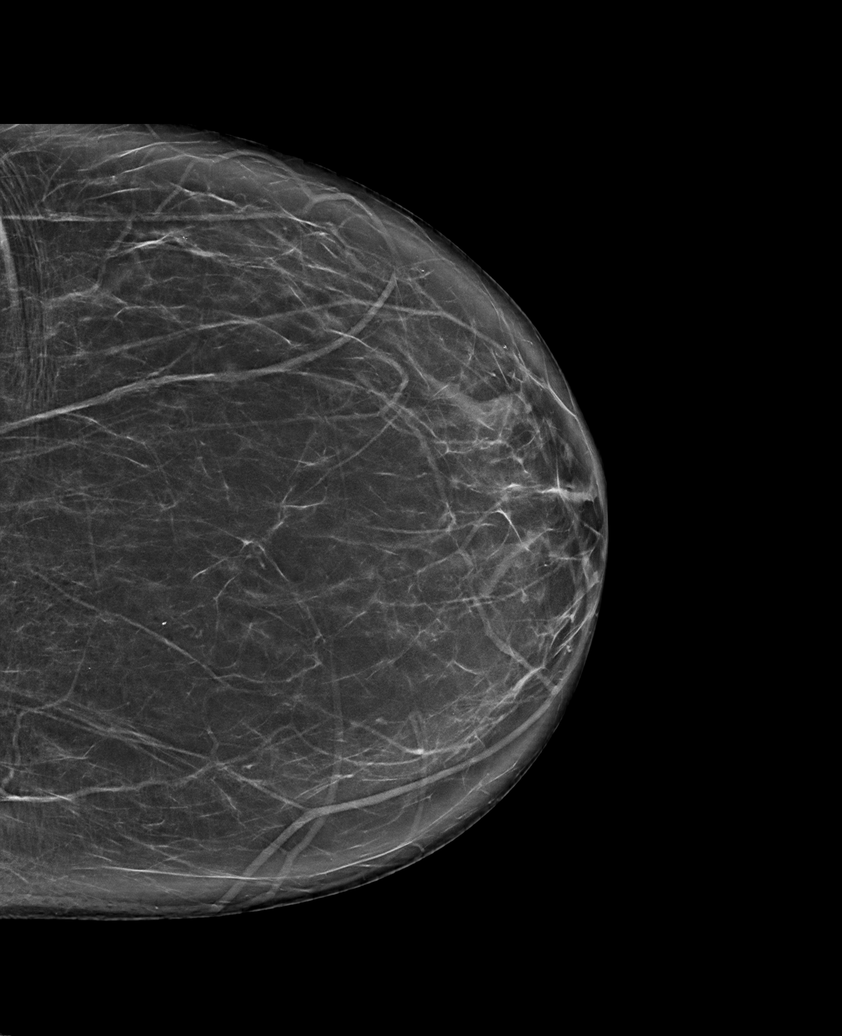

[6 of 36 positions shown; findings below may reference images not displayed]

ACR Breast Density Category b: There are scattered areas of
fibroglandular density.
FINDINGS: There are no findings suspicious for malignancy.
IMPRESSION: No mammographic evidence of malignancy. A result letter of this
screening mammogram will be mailed directly to the patient.

RECOMMENDATION:
Screening mammogram in one year. (Code:51-O-LD2)

BI-RADS CATEGORY  1: Negative.

## 2021-11-12 ENCOUNTER — Encounter (HOSPITAL_COMMUNITY)
Admission: RE | Disposition: A | Discharge status: Home / Self Care | Payer: Self-pay | Source: Home / Self Care | Attending: Gastroenterology

## 2021-11-12 ENCOUNTER — Encounter (HOSPITAL_COMMUNITY): Payer: Self-pay | Admitting: Gastroenterology

## 2021-11-12 ENCOUNTER — Ambulatory Visit (HOSPITAL_COMMUNITY)
Admission: RE | Admit: 2021-11-12 | Discharge: 2021-11-12 | Disposition: A | Discharge status: Home / Self Care | Payer: Medicare PPO | Attending: Gastroenterology | Admitting: Gastroenterology

## 2021-11-12 DIAGNOSIS — D509 Iron deficiency anemia, unspecified: Secondary | ICD-10-CM | POA: Insufficient documentation

## 2021-11-12 HISTORY — PX: GIVENS CAPSULE STUDY: SHX5432

## 2021-11-12 SURGERY — IMAGING PROCEDURE, GI TRACT, INTRALUMINAL, VIA CAPSULE
Anesthesia: LOCAL

## 2021-11-12 SURGICAL SUPPLY — 1 items: TOWEL COTTON PACK 4EA (MISCELLANEOUS) ×6 IMPLANT

## 2021-11-12 NOTE — Progress Notes (Signed)
Pt was here and ingested capsule.  Pt instructed to return capsule this afternoon

## 2022-03-04 ENCOUNTER — Other Ambulatory Visit: Payer: Self-pay | Admitting: Oncology

## 2022-03-04 DIAGNOSIS — D539 Nutritional anemia, unspecified: Secondary | ICD-10-CM

## 2022-03-05 ENCOUNTER — Other Ambulatory Visit: Payer: Self-pay | Admitting: Oncology

## 2022-03-05 ENCOUNTER — Encounter: Payer: Self-pay | Admitting: Oncology

## 2022-03-05 ENCOUNTER — Inpatient Hospital Stay: Payer: Medicare PPO

## 2022-03-05 ENCOUNTER — Other Ambulatory Visit: Payer: Self-pay

## 2022-03-05 ENCOUNTER — Inpatient Hospital Stay: Payer: Medicare PPO | Attending: Oncology | Admitting: Oncology

## 2022-03-05 VITALS — BP 155/78 | HR 80 | Temp 98.1°F | Resp 16 | Ht 65.0 in | Wt 193.4 lb

## 2022-03-05 DIAGNOSIS — Z79899 Other long term (current) drug therapy: Secondary | ICD-10-CM | POA: Diagnosis not present

## 2022-03-05 DIAGNOSIS — Z8041 Family history of malignant neoplasm of ovary: Secondary | ICD-10-CM

## 2022-03-05 DIAGNOSIS — Z9079 Acquired absence of other genital organ(s): Secondary | ICD-10-CM | POA: Diagnosis not present

## 2022-03-05 DIAGNOSIS — Z800 Family history of malignant neoplasm of digestive organs: Secondary | ICD-10-CM

## 2022-03-05 DIAGNOSIS — Z90722 Acquired absence of ovaries, bilateral: Secondary | ICD-10-CM | POA: Diagnosis not present

## 2022-03-05 DIAGNOSIS — D75839 Thrombocytosis, unspecified: Secondary | ICD-10-CM

## 2022-03-05 DIAGNOSIS — D509 Iron deficiency anemia, unspecified: Secondary | ICD-10-CM

## 2022-03-05 DIAGNOSIS — Z90710 Acquired absence of both cervix and uterus: Secondary | ICD-10-CM | POA: Diagnosis not present

## 2022-03-05 DIAGNOSIS — D539 Nutritional anemia, unspecified: Secondary | ICD-10-CM

## 2022-03-05 DIAGNOSIS — R0602 Shortness of breath: Secondary | ICD-10-CM

## 2022-03-05 DIAGNOSIS — R5383 Other fatigue: Secondary | ICD-10-CM | POA: Diagnosis not present

## 2022-03-05 DIAGNOSIS — R519 Headache, unspecified: Secondary | ICD-10-CM

## 2022-03-05 DIAGNOSIS — F5089 Other specified eating disorder: Secondary | ICD-10-CM

## 2022-03-05 DIAGNOSIS — R251 Tremor, unspecified: Secondary | ICD-10-CM | POA: Diagnosis not present

## 2022-03-05 DIAGNOSIS — Z7984 Long term (current) use of oral hypoglycemic drugs: Secondary | ICD-10-CM

## 2022-03-05 LAB — FERRITIN: Ferritin: 10 ng/mL — ABNORMAL LOW (ref 11–307)

## 2022-03-05 LAB — IRON AND TIBC
Iron: 25 ug/dL — ABNORMAL LOW (ref 28–170)
Saturation Ratios: 5 % — ABNORMAL LOW (ref 10.4–31.8)
TIBC: 535 ug/dL — ABNORMAL HIGH (ref 250–450)
UIBC: 510 ug/dL

## 2022-03-05 LAB — CBC AND DIFFERENTIAL
HCT: 37 (ref 36–46)
Hemoglobin: 10.9 — AB (ref 12.0–16.0)
Neutrophils Absolute: 5.53
Platelets: 425 10*3/uL — AB (ref 150–400)
WBC: 8.5

## 2022-03-05 LAB — VITAMIN B12: Vitamin B-12: 156 pg/mL — ABNORMAL LOW (ref 180–914)

## 2022-03-05 LAB — CBC: RBC: 5.26 — AB (ref 3.87–5.11)

## 2022-03-05 LAB — LACTATE DEHYDROGENASE: LDH: 123 U/L (ref 98–192)

## 2022-03-05 LAB — FOLATE: Folate: 7.4 ng/mL (ref >5.9)

## 2022-03-06 ENCOUNTER — Other Ambulatory Visit: Payer: Self-pay | Admitting: Pharmacist

## 2022-03-06 ENCOUNTER — Other Ambulatory Visit: Payer: Self-pay

## 2022-03-06 ENCOUNTER — Telehealth: Payer: Self-pay

## 2022-03-06 DIAGNOSIS — D509 Iron deficiency anemia, unspecified: Secondary | ICD-10-CM

## 2022-03-06 DIAGNOSIS — D539 Nutritional anemia, unspecified: Secondary | ICD-10-CM

## 2022-03-06 NOTE — Telephone Encounter (Signed)
Referral sent electronically to genetics on 03/06/22.

## 2022-03-06 NOTE — Telephone Encounter (Signed)
-----   Message from Derwood Kaplan, MD sent at 03/05/2022  7:13 PM EDT ----- Regarding: refer Pls refer Genetics clinic - mult relatives with colon and other cancers, incl ovarian

## 2022-03-08 LAB — HAPTOGLOBIN: Haptoglobin: 165 mg/dL (ref 37–355)

## 2022-03-09 ENCOUNTER — Encounter: Payer: Self-pay | Admitting: Oncology

## 2022-03-09 DIAGNOSIS — D519 Vitamin B12 deficiency anemia, unspecified: Secondary | ICD-10-CM | POA: Insufficient documentation

## 2022-03-09 LAB — PROTEIN ELECTROPHORESIS, SERUM
A/G Ratio: 1 (ref 0.7–1.7)
Albumin ELP: 3.7 g/dL (ref 2.9–4.4)
Alpha-1-Globulin: 0.2 g/dL (ref 0.0–0.4)
Alpha-2-Globulin: 1 g/dL (ref 0.4–1.0)
Beta Globulin: 1.5 g/dL — ABNORMAL HIGH (ref 0.7–1.3)
Gamma Globulin: 1.1 g/dL (ref 0.4–1.8)
Globulin, Total: 3.7 g/dL (ref 2.2–3.9)
Total Protein ELP: 7.4 g/dL (ref 6.0–8.5)

## 2022-03-09 MED FILL — Iron Sucrose Inj 20 MG/ML (Fe Equiv): INTRAVENOUS | Qty: 10 | Status: AC

## 2022-03-10 ENCOUNTER — Inpatient Hospital Stay: Payer: Medicare PPO

## 2022-03-10 VITALS — BP 147/72 | HR 80 | Temp 97.9°F | Resp 18 | Ht 65.0 in | Wt 191.0 lb

## 2022-03-10 DIAGNOSIS — D509 Iron deficiency anemia, unspecified: Secondary | ICD-10-CM | POA: Diagnosis not present

## 2022-03-10 MED ORDER — SODIUM CHLORIDE 0.9 % IV SOLN
200.0000 mg | Freq: Once | INTRAVENOUS | Status: AC
  Administered 2022-03-10: 200 mg via INTRAVENOUS
  Filled 2022-03-10: qty 200

## 2022-03-10 MED ORDER — SODIUM CHLORIDE 0.9 % IV SOLN: Freq: Once | INTRAVENOUS | Status: AC

## 2022-03-10 MED ORDER — CYANOCOBALAMIN 1000 MCG/ML IJ SOLN
1000.0000 ug | Freq: Once | INTRAMUSCULAR | Status: AC
  Administered 2022-03-10: 1000 ug via INTRAMUSCULAR
  Filled 2022-03-10: qty 1

## 2022-03-10 NOTE — Patient Instructions (Signed)
Iron Sucrose Injection What is this medication? IRON SUCROSE (EYE ern SOO krose) treats low levels of iron (iron deficiency anemia) in people with kidney disease. Iron is a mineral that plays an important role in making red blood cells, which carry oxygen from your lungs to the rest of your body. This medicine may be used for other purposes; ask your health care provider or pharmacist if you have questions. COMMON BRAND NAME(S): Venofer What should I tell my care team before I take this medication? They need to know if you have any of these conditions: Anemia not caused by low iron levels Heart disease High levels of iron in the blood Kidney disease Liver disease An unusual or allergic reaction to iron, other medications, foods, dyes, or preservatives Pregnant or trying to get pregnant Breast-feeding How should I use this medication? This medication is for infusion into a vein. It is given in a hospital or clinic setting. Talk to your care team about the use of this medication in children. While this medication may be prescribed for children as young as 2 years for selected conditions, precautions do apply. Overdosage: If you think you have taken too much of this medicine contact a poison control center or emergency room at once. NOTE: This medicine is only for you. Do not share this medicine with others. What if I miss a dose? It is important not to miss your dose. Call your care team if you are unable to keep an appointment. What may interact with this medication? Do not take this medication with any of the following: Deferoxamine Dimercaprol Other iron products This medication may also interact with the following: Chloramphenicol Deferasirox This list may not describe all possible interactions. Give your health care provider a list of all the medicines, herbs, non-prescription drugs, or dietary supplements you use. Also tell them if you smoke, drink alcohol, or use illegal drugs.  Some items may interact with your medicine. What should I watch for while using this medication? Visit your care team regularly. Tell your care team if your symptoms do not start to get better or if they get worse. You may need blood work done while you are taking this medication. You may need to follow a special diet. Talk to your care team. Foods that contain iron include: whole grains/cereals, dried fruits, beans, or peas, leafy green vegetables, and organ meats (liver, kidney). What side effects may I notice from receiving this medication? Side effects that you should report to your care team as soon as possible: Allergic reactions--skin rash, itching, hives, swelling of the face, lips, tongue, or throat Low blood pressure--dizziness, feeling faint or lightheaded, blurry vision Shortness of breath Side effects that usually do not require medical attention (report to your care team if they continue or are bothersome): Flushing Headache Joint pain Muscle pain Nausea Pain, redness, or irritation at injection site This list may not describe all possible side effects. Call your doctor for medical advice about side effects. You may report side effects to FDA at 1-800-FDA-1088. Where should I keep my medication? This medication is given in a hospital or clinic and will not be stored at home. NOTE: This sheet is a summary. It may not cover all possible information. If you have questions about this medicine, talk to your doctor, pharmacist, or health care provider.  2023 Elsevier/Gold Standard (2021-01-31 00:00:00) Vitamin B12 Deficiency Vitamin B12 deficiency means that your body does not have enough vitamin B12. The body needs this important vitamin: To  make red blood cells. To make genes (DNA). To help the nerves work. If you do not have enough vitamin B12 in your body, you can have health problems, such as not having enough red blood cells in the blood (anemia). What are the causes? Not  eating enough foods that contain vitamin B12. Not being able to take in (absorb) vitamin B12 from the food that you eat. Certain diseases. A condition in which the body does not make enough of a certain protein. This results in your body not taking in enough vitamin B12. Having a surgery in which part of the stomach or small intestine is taken out. Taking medicines that make it hard for the body to take in vitamin B12. These include: Heartburn medicines. Some medicines that are used to treat diabetes. What increases the risk? Being an older adult. Eating a vegetarian or vegan diet that does not include any foods that come from animals. Not eating enough foods that contain vitamin B12 while you are pregnant. Taking certain medicines. Having alcoholism. What are the signs or symptoms? In some cases, there are no symptoms. If the condition leads to too few blood cells or nerve damage, symptoms can occur, such as: Feeling weak or tired. Not being hungry. Losing feeling (numbness) or tingling in your hands and feet. Redness and burning of the tongue. Feeling sad (depressed). Confusion or memory problems. Trouble walking. If anemia is very bad, symptoms can include: Being short of breath. Being dizzy. Having a very fast heartbeat. How is this treated? Changing the way you eat and drink, such as: Eating more foods that contain vitamin B12. Drinking little or no alcohol. Getting vitamin B12 shots. Taking vitamin B12 supplements by mouth (orally). Your doctor will tell you the dose that is best for you. Follow these instructions at home: Eating and drinking  Eat foods that come from animals and have a lot of vitamin B12 in them. These include: Meats and poultry. This includes beef, pork, chicken, [place], and organ meats, such as liver. Seafood, such as clams, rainbow trout, salmon, tuna, and haddock. Eggs. Dairy foods such as milk, yogurt, and cheese. Eat breakfast cereals that have  vitamin B12 added to them (are fortified). Check the label. The items listed above may not be a complete list of foods and beverages you can eat and drink. Contact a dietitian for more information. Alcohol use Do not drink alcohol if: Your doctor tells you not to drink. You are pregnant, may be pregnant, or are planning to become pregnant. If you drink alcohol: Limit how much you have to: 0-1 drink a day for women. 0-2 drinks a day for men. Know how much alcohol is in your drink. In the [place] one drink equals one 12 oz bottle of beer (355 mL), one 5 oz glass of wine (148 mL), or one 1 oz glass of hard liquor (44 mL). General instructions Get any vitamin B12 shots if told by your doctor. Take supplements only as told by your doctor. Follow the directions. Keep all follow-up visits. Contact a doctor if: Your symptoms come back. Your symptoms get worse or do not get better with treatment. Get help right away if: You have trouble breathing. You have a very fast heartbeat. You have chest pain. You get dizzy. You faint. These symptoms may be an emergency. Get help right away. Call 911. Do not wait to see if the symptoms will go away. Do not drive yourself to the hospital. Summary Vitamin B12 deficiency  means that your body is not getting enough of the vitamin. In some cases, there are no symptoms of this condition. Treatment may include making a change in the way you eat and drink, getting shots, or taking supplements. Eat foods that have vitamin B12 in them. This information is not intended to replace advice given to you by your health care provider. Make sure you discuss any questions you have with your health care provider. Document Revised: 05/02/2021 Document Reviewed: 05/02/2021 Elsevier Patient Education  [place].

## 2022-03-11 MED FILL — Iron Sucrose Inj 20 MG/ML (Fe Equiv): INTRAVENOUS | Qty: 10 | Status: AC

## 2022-03-12 ENCOUNTER — Inpatient Hospital Stay: Payer: Medicare PPO

## 2022-03-12 VITALS — BP 134/63 | HR 78 | Temp 98.1°F | Resp 20 | Ht 65.0 in | Wt 191.1 lb

## 2022-03-12 DIAGNOSIS — D509 Iron deficiency anemia, unspecified: Secondary | ICD-10-CM | POA: Diagnosis not present

## 2022-03-12 MED ORDER — SODIUM CHLORIDE 0.9 % IV SOLN
200.0000 mg | Freq: Once | INTRAVENOUS | Status: AC
  Administered 2022-03-12: 200 mg via INTRAVENOUS
  Filled 2022-03-12: qty 200

## 2022-03-12 MED ORDER — SODIUM CHLORIDE 0.9 % IV SOLN: Freq: Once | INTRAVENOUS | Status: AC

## 2022-03-12 NOTE — Patient Instructions (Signed)

## 2022-03-13 ENCOUNTER — Telehealth: Payer: Self-pay

## 2022-03-13 NOTE — Telephone Encounter (Signed)
I found a Mammo and DEXA from 03/12/21,They are under the result review tab. Was there suppose to be a sooner mammo than 03/12/21?

## 2022-03-17 ENCOUNTER — Inpatient Hospital Stay: Payer: Medicare PPO

## 2022-03-17 VITALS — BP 160/74 | HR 80 | Temp 98.0°F | Resp 18 | Ht 65.0 in | Wt 192.8 lb

## 2022-03-17 DIAGNOSIS — D509 Iron deficiency anemia, unspecified: Secondary | ICD-10-CM | POA: Diagnosis not present

## 2022-03-17 MED ORDER — SODIUM CHLORIDE 0.9 % IV SOLN
200.0000 mg | Freq: Once | INTRAVENOUS | Status: AC
  Administered 2022-03-17: 200 mg via INTRAVENOUS
  Filled 2022-03-17: qty 200

## 2022-03-17 MED ORDER — CYANOCOBALAMIN 1000 MCG/ML IJ SOLN
1000.0000 ug | Freq: Once | INTRAMUSCULAR | Status: AC
  Administered 2022-03-17: 1000 ug via INTRAMUSCULAR
  Filled 2022-03-17: qty 1

## 2022-03-17 MED ORDER — SODIUM CHLORIDE 0.9 % IV SOLN: Freq: Once | INTRAVENOUS | Status: AC

## 2022-03-18 ENCOUNTER — Inpatient Hospital Stay: Payer: Medicare PPO

## 2022-03-18 ENCOUNTER — Encounter: Payer: Self-pay | Admitting: Oncology

## 2022-03-18 VITALS — BP 146/74 | HR 78 | Temp 98.3°F | Resp 18

## 2022-03-18 DIAGNOSIS — D509 Iron deficiency anemia, unspecified: Secondary | ICD-10-CM

## 2022-03-18 MED ORDER — SODIUM CHLORIDE 0.9 % IV SOLN: Freq: Once | INTRAVENOUS | Status: AC

## 2022-03-18 MED ORDER — SODIUM CHLORIDE 0.9 % IV SOLN
200.0000 mg | Freq: Once | INTRAVENOUS | Status: AC
  Administered 2022-03-18: 200 mg via INTRAVENOUS
  Filled 2022-03-18: qty 200

## 2022-03-18 MED FILL — Iron Sucrose Inj 20 MG/ML (Fe Equiv): INTRAVENOUS | Qty: 10 | Status: AC

## 2022-03-18 NOTE — Addendum Note (Signed)
Addended by: Juanetta Beets on: 03/18/2022 02:36 PM   Modules accepted: Orders

## 2022-03-19 ENCOUNTER — Inpatient Hospital Stay: Payer: Medicare PPO

## 2022-03-19 VITALS — BP 154/74 | HR 75 | Temp 98.7°F | Resp 18 | Ht 65.0 in | Wt 192.2 lb

## 2022-03-19 DIAGNOSIS — D509 Iron deficiency anemia, unspecified: Secondary | ICD-10-CM | POA: Diagnosis not present

## 2022-03-19 MED ORDER — SODIUM CHLORIDE 0.9 % IV SOLN: Freq: Once | INTRAVENOUS | Status: AC

## 2022-03-19 MED ORDER — SODIUM CHLORIDE 0.9 % IV SOLN
200.0000 mg | Freq: Once | INTRAVENOUS | Status: AC
  Administered 2022-03-19: 200 mg via INTRAVENOUS
  Filled 2022-03-19: qty 200

## 2022-03-19 NOTE — Patient Instructions (Signed)

## 2022-03-20 ENCOUNTER — Encounter: Payer: Self-pay | Admitting: Oncology
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
## 2022-03-25 ENCOUNTER — Inpatient Hospital Stay: Payer: Medicare PPO | Attending: Oncology

## 2022-03-25 VITALS — BP 135/77 | HR 91 | Temp 97.7°F | Resp 20 | Wt 192.0 lb

## 2022-03-25 DIAGNOSIS — D509 Iron deficiency anemia, unspecified: Secondary | ICD-10-CM | POA: Insufficient documentation

## 2022-03-25 MED ORDER — CYANOCOBALAMIN 1000 MCG/ML IJ SOLN
1000.0000 ug | Freq: Once | INTRAMUSCULAR | Status: AC
  Administered 2022-03-25: 1000 ug via INTRAMUSCULAR
  Filled 2022-03-25: qty 1

## 2022-03-25 NOTE — Patient Instructions (Signed)
Vitamin B12 Injection What is this medication? Vitamin B12 (VAHY tuh min B12) prevents and treats low vitamin B12 levels in your body. It is used in people who do not get enough vitamin B12 from their diet or when their digestive tract does not absorb enough. Vitamin B12 plays an important role in maintaining the health of your nervous system and red blood cells. This medicine may be used for other purposes; ask your health care provider or pharmacist if you have questions. COMMON BRAND NAME(S): B-12 Compliance Kit, B-12 Injection Kit, Cyomin, Dodex, LA-12, Nutri-Twelve, Physicians EZ Use B-12, Primabalt What should I tell my care team before I take this medication? They need to know if you have any of these conditions: Kidney disease Leber's disease Megaloblastic anemia An unusual or allergic reaction to cyanocobalamin, cobalt, other medications, foods, dyes, or preservatives Pregnant or trying to get pregnant Breast-feeding How should I use this medication? This medication is injected into a muscle or deeply under the skin. It is usually given in a clinic or care team's office. However, your care team may teach you how to inject yourself. Follow all instructions. Talk to your care team about the use of this medication in children. Special care may be needed. Overdosage: If you think you have taken too much of this medicine contact a poison control center or emergency room at once. NOTE: This medicine is only for you. Do not share this medicine with others. What if I miss a dose? If you are given your dose at a clinic or care team's office, call to reschedule your appointment. If you give your own injections, and you miss a dose, take it as soon as you can. If it is almost time for your next dose, take only that dose. Do not take double or extra doses. What may interact with this medication? Alcohol Colchicine This list may not describe all possible interactions. Give your health care  provider a list of all the medicines, herbs, non-prescription drugs, or dietary supplements you use. Also tell them if you smoke, drink alcohol, or use illegal drugs. Some items may interact with your medicine. What should I watch for while using this medication? Visit your care team regularly. You may need blood work done while you are taking this medication. You may need to follow a special diet. Talk to your care team. Limit your alcohol intake and avoid smoking to get the best benefit. What side effects may I notice from receiving this medication? Side effects that you should report to your care team as soon as possible: Allergic reactions--skin rash, itching, hives, swelling of the face, lips, tongue, or throat Swelling of the ankles, hands, or feet Trouble breathing Side effects that usually do not require medical attention (report to your care team if they continue or are bothersome): Diarrhea This list may not describe all possible side effects. Call your doctor for medical advice about side effects. You may report side effects to FDA at 1-800-FDA-1088. Where should I keep my medication? Keep out of the reach of children. Store at room temperature between 15 and 30 degrees C (59 and 85 degrees F). Protect from light. Throw away any unused medication after the expiration date. NOTE: This sheet is a summary. It may not cover all possible information. If you have questions about this medicine, talk to your doctor, pharmacist, or health care provider.  2023 Elsevier/Gold Standard (2021-05-20 00:00:00)

## 2022-03-30 ENCOUNTER — Encounter: Payer: Self-pay | Admitting: Oncology

## 2022-03-31 ENCOUNTER — Inpatient Hospital Stay: Payer: Medicare PPO

## 2022-03-31 ENCOUNTER — Encounter: Payer: Self-pay | Admitting: Oncology

## 2022-03-31 VITALS — BP 151/67 | HR 86 | Temp 98.0°F | Resp 18 | Ht 65.0 in | Wt 191.0 lb

## 2022-03-31 DIAGNOSIS — D509 Iron deficiency anemia, unspecified: Secondary | ICD-10-CM

## 2022-03-31 MED ORDER — CYANOCOBALAMIN 1000 MCG/ML IJ SOLN
1000.0000 ug | Freq: Once | INTRAMUSCULAR | Status: AC
  Administered 2022-03-31: 1000 ug via INTRAMUSCULAR
  Filled 2022-03-31: qty 1

## 2022-03-31 NOTE — Patient Instructions (Signed)
Vitamin B12 Deficiency Vitamin B12 deficiency means that your body does not have enough vitamin B12. The body needs this important vitamin: To make red blood cells. To make genes (DNA). To help the nerves work. If you do not have enough vitamin B12 in your body, you can have health problems, such as not having enough red blood cells in the blood (anemia). What are the causes? Not eating enough foods that contain vitamin B12. Not being able to take in (absorb) vitamin B12 from the food that you eat. Certain diseases. A condition in which the body does not make enough of a certain protein. This results in your body not taking in enough vitamin B12. Having a surgery in which part of the stomach or small intestine is taken out. Taking medicines that make it hard for the body to take in vitamin B12. These include: Heartburn medicines. Some medicines that are used to treat diabetes. What increases the risk? Being an older adult. Eating a vegetarian or vegan diet that does not include any foods that come from animals. Not eating enough foods that contain vitamin B12 while you are pregnant. Taking certain medicines. Having alcoholism. What are the signs or symptoms? In some cases, there are no symptoms. If the condition leads to too few blood cells or nerve damage, symptoms can occur, such as: Feeling weak or tired. Not being hungry. Losing feeling (numbness) or tingling in your hands and feet. Redness and burning of the tongue. Feeling sad (depressed). Confusion or memory problems. Trouble walking. If anemia is very bad, symptoms can include: Being short of breath. Being dizzy. Having a very fast heartbeat. How is this treated? Changing the way you eat and drink, such as: Eating more foods that contain vitamin B12. Drinking little or no alcohol. Getting vitamin B12 shots. Taking vitamin B12 supplements by mouth (orally). Your doctor will tell you the dose that is best for you. Follow  these instructions at home: Eating and drinking  Eat foods that come from animals and have a lot of vitamin B12 in them. These include: Meats and poultry. This includes beef, pork, chicken, turkey, and organ meats, such as liver. Seafood, such as clams, rainbow trout, salmon, tuna, and haddock. Eggs. Dairy foods such as milk, yogurt, and cheese. Eat breakfast cereals that have vitamin B12 added to them (are fortified). Check the label. The items listed above may not be a complete list of foods and beverages you can eat and drink. Contact a dietitian for more information. Alcohol use Do not drink alcohol if: Your doctor tells you not to drink. You are pregnant, may be pregnant, or are planning to become pregnant. If you drink alcohol: Limit how much you have to: 0-1 drink a day for women. 0-2 drinks a day for men. Know how much alcohol is in your drink. In the [place] one drink equals one 12 oz bottle of beer (355 mL), one 5 oz glass of wine (148 mL), or one 1 oz glass of hard liquor (44 mL). General instructions Get any vitamin B12 shots if told by your doctor. Take supplements only as told by your doctor. Follow the directions. Keep all follow-up visits. Contact a doctor if: Your symptoms come back. Your symptoms get worse or do not get better with treatment. Get help right away if: You have trouble breathing. You have a very fast heartbeat. You have chest pain. You get dizzy. You faint. These symptoms may be an emergency. Get help right away. Call 911.   Do not wait to see if the symptoms will go away. Do not drive yourself to the hospital. Summary Vitamin B12 deficiency means that your body is not getting enough of the vitamin. In some cases, there are no symptoms of this condition. Treatment may include making a change in the way you eat and drink, getting shots, or taking supplements. Eat foods that have vitamin B12 in them. This information is not intended to replace advice  given to you by your health care provider. Make sure you discuss any questions you have with your health care provider. Document Revised: 05/02/2021 Document Reviewed: 05/02/2021 Elsevier Patient Education  2023 Elsevier Inc.  

## 2022-04-03 ENCOUNTER — Other Ambulatory Visit: Payer: Self-pay | Admitting: Obstetrics and Gynecology

## 2022-04-03 DIAGNOSIS — Z1231 Encounter for screening mammogram for malignant neoplasm of breast: Secondary | ICD-10-CM

## 2022-04-14 ENCOUNTER — Ambulatory Visit
Admission: RE | Admit: 2022-04-14 | Discharge: 2022-04-14 | Disposition: A | Discharge status: Home / Self Care | Payer: Medicare PPO | Source: Ambulatory Visit | Attending: Obstetrics and Gynecology | Admitting: Obstetrics and Gynecology

## 2022-04-14 DIAGNOSIS — Z1231 Encounter for screening mammogram for malignant neoplasm of breast: Secondary | ICD-10-CM

## 2022-05-07 ENCOUNTER — Other Ambulatory Visit: Payer: Self-pay

## 2022-05-08 ENCOUNTER — Inpatient Hospital Stay: Payer: Medicare PPO

## 2022-05-08 ENCOUNTER — Telehealth: Payer: Self-pay | Admitting: Hematology and Oncology

## 2022-05-08 ENCOUNTER — Inpatient Hospital Stay: Payer: Medicare PPO | Attending: Oncology | Admitting: Hematology and Oncology

## 2022-05-08 ENCOUNTER — Encounter: Payer: Self-pay | Admitting: Hematology and Oncology

## 2022-05-08 VITALS — BP 152/93 | HR 87 | Temp 98.2°F | Resp 18 | Ht 65.0 in | Wt 187.9 lb

## 2022-05-08 DIAGNOSIS — D509 Iron deficiency anemia, unspecified: Secondary | ICD-10-CM | POA: Insufficient documentation

## 2022-05-08 DIAGNOSIS — D539 Nutritional anemia, unspecified: Secondary | ICD-10-CM

## 2022-05-08 LAB — CBC AND DIFFERENTIAL
HCT: 44 (ref 36–46)
Hemoglobin: 14.2 (ref 12.0–16.0)
Neutrophils Absolute: 5.84
Platelets: 342 10*3/uL (ref 150–400)
WBC: 8

## 2022-05-08 LAB — IRON AND TIBC
Iron: 63 ug/dL (ref 28–170)
Saturation Ratios: 17 % (ref 10.4–31.8)
TIBC: 377 ug/dL (ref 250–450)
UIBC: 314 ug/dL

## 2022-05-08 LAB — FERRITIN: Ferritin: 90 ng/mL (ref 11–307)

## 2022-05-08 LAB — CBC: RBC: 5.17 — AB (ref 3.87–5.11)

## 2022-05-08 MED ORDER — CYANOCOBALAMIN 1000 MCG/ML IJ SOLN
1000.0000 ug | Freq: Once | INTRAMUSCULAR | Status: AC
  Administered 2022-05-08: 1000 ug via INTRAMUSCULAR
  Filled 2022-05-08: qty 1

## 2022-05-08 NOTE — Progress Notes (Signed)
Patient Care Team: Algis Greenhouse, MD as PCP - General (Family Medicine)  Clinic Day:  05/08/2022  Referring physician: Algis Greenhouse, MD  ASSESSMENT & PLAN:   Assessment & Plan: Iron deficiency anemia She has had a great response to IV iron with an increase of her hemoglobin to 14 today. She states her symptoms have improved greatly. She will continue with B12 injections and is due today. We will plan to see her again in 3 months for repeat evaluation.    The patient understands the plans discussed today and is in agreement with them.  She knows to contact our office if she develops concerns prior to her next appointment.    Kimberly Ped, NP  [facility] [address] [place] [address] Dept: [phone] Dept Fax: [phone]   Orders Placed This Encounter  Procedures   CBC and differential    This external order was created through the Results Console.   CBC    This external order was created through the Results Console.      CHIEF COMPLAINT:  CC: A 70 year old female with history of iron deficiency anemia here for 4 week evaluation  Current Treatment:  Surveillance  INTERVAL HISTORY:  Kimberly Barker is here today for repeat clinical assessment. She denies fevers or chills. She denies pain. Her appetite is good. Her weight has been stable.  I have reviewed the past medical history, past surgical history, social history and family history with the patient and they are unchanged from previous note.  ALLERGIES:  is allergic to atorvastatin and rosuvastatin.  MEDICATIONS:  Current Outpatient Medications  Medication Sig Dispense Refill   calcium carbonate (OSCAL) 1500 (600 Ca) MG TABS tablet Take 600 mg by mouth 2 (two) times daily.      cholecalciferol (VITAMIN D3) 25 MCG (1000 UNIT) tablet Take 1,000 Units by mouth in the morning and at bedtime.     DHA-EPA-Flaxseed Oil-Vitamin E (THERA TEARS  NUTRITION PO) Take 3 capsules by mouth daily.     diphenhydrAMINE (BENADRYL) 25 MG tablet Take 25 mg by mouth every 6 (six) hours as needed for itching or allergies.     empagliflozin (JARDIANCE) 25 MG TABS tablet Take 25 mg by mouth daily.     ezetimibe (ZETIA) 10 MG tablet Take 10 mg by mouth daily.     irbesartan (AVAPRO) 150 MG tablet Take 150 mg by mouth daily.     levothyroxine (SYNTHROID) 100 MCG tablet Take 100 mcg by mouth daily before breakfast.     meloxicam (MOBIC) 15 MG tablet meloxicam Take (oral) No date recorded tablet No frequency recorded oral No set duration recorded No set duration amount recorded active 15 mg     metFORMIN (GLUCOPHAGE-XR) 500 MG 24 hr tablet Take 1,000 mg by mouth 2 (two) times daily.     nystatin-triamcinolone ointment (MYCOLOG) Apply 1 application topically 2 (two) times daily. (Patient not taking: Reported on 1/51/7616) 30 g 2   TRULICITY 3 WV/3.7TG SOPN SMARTSIG:3 Milligram(s) SUB-Q Once a Week     No current facility-administered medications for this visit.    HISTORY OF PRESENT ILLNESS:   Oncology History   No history exists.      REVIEW OF SYSTEMS:   Constitutional: Denies fevers, chills or abnormal weight loss Eyes: Denies blurriness of vision Ears, nose, mouth, throat, and face: Denies mucositis or sore throat Respiratory: Denies cough, dyspnea or wheezes Cardiovascular: Denies palpitation, chest discomfort  or lower extremity swelling Gastrointestinal:  Denies nausea, heartburn or change in bowel habits Skin: Denies abnormal skin rashes Lymphatics: Denies new lymphadenopathy or easy bruising Neurological:Denies numbness, tingling or new weaknesses Behavioral/Psych: Mood is stable, no new changes  All other systems were reviewed with the patient and are negative.   VITALS:  Blood pressure (!) 158/84, pulse 95, temperature 97.7 F (36.5 C), temperature source Oral, resp. rate 20, height '5\' 5"'$  (1.651 m), weight 187 lb 14.4 oz (85.2  kg), last menstrual period 02/20/2000, SpO2 94 %.  Wt Readings from Last 3 Encounters:  05/08/22 187 lb 14.4 oz (85.2 kg)  03/31/22 191 lb 0.6 oz (86.7 kg)  03/25/22 192 lb 0.6 oz (87.1 kg)    Body mass index is 31.27 kg/m.  Performance status (ECOG): 1 - Symptomatic but completely ambulatory  PHYSICAL EXAM:   GENERAL:alert, no distress and comfortable SKIN: skin color, texture, turgor are normal, no rashes or significant lesions EYES: normal, Conjunctiva are pink and non-injected, sclera clear OROPHARYNX:no exudate, no erythema and lips, buccal mucosa, and tongue normal  NECK: supple, thyroid normal size, non-tender, without nodularity LYMPH:  no palpable lymphadenopathy in the cervical, axillary or inguinal LUNGS: clear to auscultation and percussion with normal breathing effort HEART: regular rate & rhythm and no murmurs and no lower extremity edema ABDOMEN:abdomen soft, non-tender and normal bowel sounds Musculoskeletal:no cyanosis of digits and no clubbing  NEURO: alert & oriented x 3 with fluent speech, no focal motor/sensory deficits  LABORATORY DATA:  I have reviewed the data as listed No results found for: "NA", "K", "CL", "CO2", "GLUCOSE", "BUN", "CREATININE", "CALCIUM", "PROT", "ALBUMIN", "AST", "ALT", "ALKPHOS", "BILITOT", "GFRNONAA", "GFRAA"  No results found for: "SPEP", "UPEP"  Lab Results  Component Value Date   WBC 8.0 05/08/2022   NEUTROABS 5.84 05/08/2022   HGB 14.2 05/08/2022   HCT 44 05/08/2022   PLT 342 05/08/2022      Chemistry   No results found for: "NA", "K", "CL", "CO2", "BUN", "CREATININE", "GLU" No results found for: "CALCIUM", "ALKPHOS", "AST", "ALT", "BILITOT"     RADIOGRAPHIC STUDIES: I have personally reviewed the radiological images as listed and agreed with the findings in the report. MM 3D SCREEN BREAST BILATERAL  Result Date: 04/16/2022 CLINICAL DATA:  Screening. EXAM: DIGITAL SCREENING BILATERAL MAMMOGRAM WITH TOMOSYNTHESIS AND  CAD TECHNIQUE: Bilateral screening digital craniocaudal and mediolateral oblique mammograms were obtained. Bilateral screening digital breast tomosynthesis was performed. The images were evaluated with computer-aided detection. COMPARISON:  Previous exam(s). ACR Breast Density Category a: The breast tissue is almost entirely fatty. FINDINGS: There are no findings suspicious for malignancy. IMPRESSION: No mammographic evidence of malignancy. A result letter of this screening mammogram will be mailed directly to the patient. RECOMMENDATION: Screening mammogram in one year. (Code:SM-B-01Y) BI-RADS CATEGORY  1: Negative. Electronically Signed   By: Audie Pinto M.D.   On: 04/16/2022 08:25

## 2022-05-08 NOTE — Assessment & Plan Note (Addendum)
She has had a great response to IV iron with an increase of her hemoglobin to 14 today. She states her symptoms have improved greatly. She will continue with B12 injections and is due today. We will plan to see her again in 3 months for repeat evaluation.

## 2022-05-08 NOTE — Patient Instructions (Signed)
Vitamin B12 Injection What is this medication? Vitamin B12 (VAHY tuh min B12) prevents and treats low vitamin B12 levels in your body. It is used in people who do not get enough vitamin B12 from their diet or when their digestive tract does not absorb enough. Vitamin B12 plays an important role in maintaining the health of your nervous system and red blood cells. This medicine may be used for other purposes; ask your health care provider or pharmacist if you have questions. COMMON BRAND NAME(S): B-12 Compliance Kit, B-12 Injection Kit, Cyomin, Dodex, LA-12, Nutri-Twelve, Physicians EZ Use B-12, Primabalt What should I tell my care team before I take this medication? They need to know if you have any of these conditions: Kidney disease Leber's disease Megaloblastic anemia An unusual or allergic reaction to cyanocobalamin, cobalt, other medications, foods, dyes, or preservatives Pregnant or trying to get pregnant Breast-feeding How should I use this medication? This medication is injected into a muscle or deeply under the skin. It is usually given in a clinic or care team's office. However, your care team may teach you how to inject yourself. Follow all instructions. Talk to your care team about the use of this medication in children. Special care may be needed. Overdosage: If you think you have taken too much of this medicine contact a poison control center or emergency room at once. NOTE: This medicine is only for you. Do not share this medicine with others. What if I miss a dose? If you are given your dose at a clinic or care team's office, call to reschedule your appointment. If you give your own injections, and you miss a dose, take it as soon as you can. If it is almost time for your next dose, take only that dose. Do not take double or extra doses. What may interact with this medication? Alcohol Colchicine This list may not describe all possible interactions. Give your health care  provider a list of all the medicines, herbs, non-prescription drugs, or dietary supplements you use. Also tell them if you smoke, drink alcohol, or use illegal drugs. Some items may interact with your medicine. What should I watch for while using this medication? Visit your care team regularly. You may need blood work done while you are taking this medication. You may need to follow a special diet. Talk to your care team. Limit your alcohol intake and avoid smoking to get the best benefit. What side effects may I notice from receiving this medication? Side effects that you should report to your care team as soon as possible: Allergic reactions--skin rash, itching, hives, swelling of the face, lips, tongue, or throat Swelling of the ankles, hands, or feet Trouble breathing Side effects that usually do not require medical attention (report to your care team if they continue or are bothersome): Diarrhea This list may not describe all possible side effects. Call your doctor for medical advice about side effects. You may report side effects to FDA at 1-800-FDA-1088. Where should I keep my medication? Keep out of the reach of children. Store at room temperature between 15 and 30 degrees C (59 and 85 degrees F). Protect from light. Throw away any unused medication after the expiration date. NOTE: This sheet is a summary. It may not cover all possible information. If you have questions about this medicine, talk to your doctor, pharmacist, or health care provider.  2023 Elsevier/Gold Standard (2020-11-14 00:00:00)

## 2022-05-08 NOTE — Telephone Encounter (Signed)
Per 05/08/22 los next appt scheduled and confirmed with patient

## 2022-06-04 ENCOUNTER — Other Ambulatory Visit: Payer: Self-pay | Admitting: Pharmacist

## 2022-06-05 ENCOUNTER — Inpatient Hospital Stay: Payer: Medicare PPO | Attending: Oncology

## 2022-06-05 DIAGNOSIS — D509 Iron deficiency anemia, unspecified: Secondary | ICD-10-CM | POA: Diagnosis present

## 2022-06-05 MED ORDER — CYANOCOBALAMIN 1000 MCG/ML IJ SOLN
1000.0000 ug | Freq: Once | INTRAMUSCULAR | Status: AC
  Administered 2022-06-05: 1000 ug via INTRAMUSCULAR
  Filled 2022-06-05: qty 1

## 2022-07-03 ENCOUNTER — Inpatient Hospital Stay: Payer: Medicare PPO | Attending: Oncology

## 2022-07-03 VITALS — BP 169/90 | HR 80 | Temp 98.2°F | Resp 16 | Ht 65.0 in | Wt 178.1 lb

## 2022-07-03 DIAGNOSIS — Z23 Encounter for immunization: Secondary | ICD-10-CM | POA: Insufficient documentation

## 2022-07-03 DIAGNOSIS — D509 Iron deficiency anemia, unspecified: Secondary | ICD-10-CM | POA: Insufficient documentation

## 2022-07-03 MED ORDER — INFLUENZA VAC SPLIT QUAD 0.5 ML IM SUSY
0.5000 mL | PREFILLED_SYRINGE | Freq: Once | INTRAMUSCULAR | Status: AC
  Administered 2022-07-03: 0.5 mL via INTRAMUSCULAR
  Filled 2022-07-03: qty 0.5

## 2022-07-03 MED ORDER — CYANOCOBALAMIN 1000 MCG/ML IJ SOLN
1000.0000 ug | Freq: Once | INTRAMUSCULAR | Status: AC
  Administered 2022-07-03: 1000 ug via INTRAMUSCULAR
  Filled 2022-07-03: qty 1

## 2022-07-03 NOTE — Patient Instructions (Signed)
Influenza Virus Vaccine injection What is this medication? INFLUENZA VIRUS VACCINE (in floo EN zuh VAHY ruhs vak SEEN) helps to reduce the risk of getting influenza also known as the flu. The vaccine only helps protect you against some strains of the flu. This medicine may be used for other purposes; ask your health care provider or pharmacist if you have questions. COMMON BRAND NAME(S): Afluria, Afluria Quadrivalent, Agriflu, Alfuria, FLUAD, FLUAD Quadrivalent, Fluarix, Fluarix Quadrivalent, Flublok, Flublok Quadrivalent, FLUCELVAX, FLUCELVAX Quadrivalent, Flulaval, Flulaval Quadrivalent, Fluvirin, Fluzone, Fluzone High-Dose, Fluzone Intradermal, Fluzone Quadrivalent What should I tell my care team before I take this medication? They need to know if you have any of these conditions: bleeding disorder like hemophilia fever or infection Guillain-Barre syndrome or other neurological problems immune system problems infection with the human immunodeficiency virus (HIV) or AIDS low blood platelet counts multiple sclerosis an unusual or allergic reaction to influenza virus vaccine, latex, other medicines, foods, dyes, or preservatives. Different brands of vaccines contain different allergens. Some may contain latex or eggs. Talk to your doctor about your allergies to make sure that you get the right vaccine. pregnant or trying to get pregnant breast-feeding How should I use this medication? This vaccine is for injection into a muscle or under the skin. It is given by a health care professional. A copy of Vaccine Information Statements will be given before each vaccination. Read this sheet carefully each time. The sheet may change frequently. Talk to your healthcare provider to see which vaccines are right for you. Some vaccines should not be used in all age groups. Overdosage: If you think you have taken too much of this medicine contact a poison control center or emergency room at once. NOTE: This  medicine is only for you. Do not share this medicine with others. What if I miss a dose? This does not apply. What may interact with this medication? chemotherapy or radiation therapy medicines that lower your immune system like etanercept, anakinra, infliximab, and adalimumab medicines that treat or prevent blood clots like warfarin phenytoin steroid medicines like prednisone or cortisone theophylline vaccines This list may not describe all possible interactions. Give your health care provider a list of all the medicines, herbs, non-prescription drugs, or dietary supplements you use. Also tell them if you smoke, drink alcohol, or use illegal drugs. Some items may interact with your medicine. What should I watch for while using this medication? Report any side effects that do not go away within 3 days to your doctor or health care professional. Call your health care provider if any unusual symptoms occur within 6 weeks of receiving this vaccine. You may still catch the flu, but the illness is not usually as bad. You cannot get the flu from the vaccine. The vaccine will not protect against colds or other illnesses that may cause fever. The vaccine is needed every year. What side effects may I notice from receiving this medication? Side effects that you should report to your doctor or health care professional as soon as possible: allergic reactions like skin rash, itching or hives, swelling of the face, lips, or tongue Side effects that usually do not require medical attention (report to your doctor or health care professional if they continue or are bothersome): fever headache muscle aches and pains pain, tenderness, redness, or swelling at the injection site tiredness This list may not describe all possible side effects. Call your doctor for medical advice about side effects. You may report side effects to FDA at 1-800-FDA-1088.   Where should I keep my medication? The vaccine will be given  by a health care professional in a clinic, pharmacy, doctor's office, or other health care setting. You will not be given vaccine doses to store at home. NOTE: This sheet is a summary. It may not cover all possible information. If you have questions about this medicine, talk to your doctor, pharmacist, or health care provider.  2023 Elsevier/Gold Standard (2021-04-11 00:00:00) Influenza, Adult Influenza is also called "the flu." It is an infection in the lungs, nose, and throat (respiratory tract). It spreads easily from person to person (is contagious). The flu causes symptoms that are like a cold, along with high fever and body aches. What are the causes? This condition is caused by the influenza virus. You can get the virus by: Breathing in droplets that are in the air after a person infected with the flu coughed or sneezed. Touching something that has the virus on it and then touching your mouth, nose, or eyes. What increases the risk? Certain things may make you more likely to get the flu. These include: Not washing your hands often. Having close contact with many people during cold and flu season. Touching your mouth, eyes, or nose without first washing your hands. Not getting a flu shot every year. You may have a higher risk for the flu, and serious problems, such as a lung infection (pneumonia), if you: Are older than 65. Are pregnant. Have a weakened disease-fighting system (immune system) because of a disease or because you are taking certain medicines. Have a long-term (chronic) condition, such as: Heart, kidney, or lung disease. Diabetes. Asthma. Have a liver disorder. Are very overweight (morbidly obese). Have anemia. What are the signs or symptoms? Symptoms usually begin suddenly and last 4-14 days. They may include: Fever and chills. Headaches, body aches, or muscle aches. Sore throat. Cough. Runny or stuffy (congested) nose. Feeling discomfort in your chest. Not  wanting to eat as much as normal. Feeling weak or tired. Feeling dizzy. Feeling sick to your stomach or throwing up. How is this treated? If the flu is found early, you can be treated with antiviral medicine. This can help to reduce how bad the illness is and how long it lasts. This may be given by mouth or through an IV tube. Taking care of yourself at home can help your symptoms get better. Your doctor may want you to: Take over-the-counter medicines. Drink plenty of fluids. The flu often goes away on its own. If you have very bad symptoms or other problems, you may be treated in a hospital. Follow these instructions at home:     Activity Rest as needed. Get plenty of sleep. Stay home from work or school as told by your doctor. Do not leave home until you do not have a fever for 24 hours without taking medicine. Leave home only to go to your doctor. Eating and drinking Take an ORS (oral rehydration solution). This is a drink that is sold at pharmacies and stores. Drink enough fluid to keep your pee pale yellow. Drink clear fluids in small amounts as you are able. Clear fluids include: Water. Ice chips. Fruit juice mixed with water. Low-calorie sports drinks. Eat bland foods that are easy to digest. Eat small amounts as you are able. These foods include: Bananas. Applesauce. Rice. Lean meats. Toast. Crackers. Do not eat or drink: Fluids that have a lot of sugar or caffeine. Alcohol. Spicy or fatty foods. General instructions Take over-the-counter and  prescription medicines only as told by your doctor. Use a cool mist humidifier to add moisture to the air in your home. This can make it easier for you to breathe. When using a cool mist humidifier, clean it daily. Empty water and replace with clean water. Cover your mouth and nose when you cough or sneeze. Wash your hands with soap and water often and for at least 20 seconds. This is also important after you cough or sneeze.  If you cannot use soap and water, use alcohol-based hand sanitizer. Keep all follow-up visits. How is this prevented?  Get a flu shot every year. You may get the flu shot in late summer, fall, or winter. Ask your doctor when you should get your flu shot. Avoid contact with people who are sick during fall and winter. This is cold and flu season. Contact a doctor if: You get new symptoms. You have: Chest pain. Watery poop (diarrhea). A fever. Your cough gets worse. You start to have more mucus. You feel sick to your stomach. You throw up. Get help right away if you: Have shortness of breath. Have trouble breathing. Have skin or nails that turn a bluish color. Have very bad pain or stiffness in your neck. Get a sudden headache. Get sudden pain in your face or ear. Cannot eat or drink without throwing up. These symptoms may represent a serious problem that is an emergency. Get medical help right away. Call your local emergency services (911 in the [place] Do not wait to see if the symptoms will go away. Do not drive yourself to the hospital. Summary Influenza is also called "the flu." It is an infection in the lungs, nose, and throat. It spreads easily from person to person. Take over-the-counter and prescription medicines only as told by your doctor. Getting a flu shot every year is the best way to not get the flu. This information is not intended to replace advice given to you by your health care provider. Make sure you discuss any questions you have with your health care provider. Document Revised: 04/26/2020 Document Reviewed: 04/26/2020 Elsevier Patient Education  [place]. Vitamin B12 Deficiency Vitamin B12 deficiency occurs when the body does not have enough of this important vitamin. The body needs this vitamin: To make red blood cells. To make DNA. This is the genetic material inside cells. To help the nerves work properly so they can carry messages from the brain  to the body. Vitamin B12 deficiency can cause health problems, such as not having enough red blood cells in the blood (anemia). This can lead to nerve damage if untreated. What are the causes? This condition may be caused by: Not eating enough foods that contain vitamin B12. Not having enough stomach acid and digestive fluids to properly absorb vitamin B12 from the food that you eat. Having certain diseases that make it hard to absorb vitamin B12. These diseases include Crohn's disease, chronic pancreatitis, and cystic fibrosis. An autoimmune disorder in which the body does not make enough of a protein (intrinsic factor) within the stomach, resulting in not enough absorption of vitamin B12. Having a surgery in which part of the stomach or small intestine is removed. Taking certain medicines that make it hard for the body to absorb vitamin B12. These include: Heartburn medicines, such as antacids and proton pump inhibitors. Some medicines that are used to treat diabetes. What increases the risk? The following factors may make you more likely to develop a vitamin B12 deficiency:  Being an older adult. Eating a vegetarian or vegan diet that does not include any foods that come from animals. Eating a poor diet while you are pregnant. Taking certain medicines. Having alcoholism. What are the signs or symptoms? In some cases, there are no symptoms of this condition. If the condition leads to anemia or nerve damage, various symptoms may occur, such as: Weakness. Tiredness (fatigue). Loss of appetite. Numbness or tingling in your hands and feet. Redness and burning of the tongue. Depression, confusion, or memory problems. Trouble walking. If anemia is severe, symptoms can include: Shortness of breath. Dizziness. Rapid heart rate. How is this diagnosed? This condition may be diagnosed with a blood test to measure the level of vitamin B12 in your blood. You may also have other tests,  including: A group of tests that measure certain characteristics of blood cells (complete blood count, CBC). A blood test to measure intrinsic factor. A procedure where a thin tube with a camera on the end is used to look into your stomach or intestines (endoscopy). Other tests may be needed to discover the cause of the deficiency. How is this treated? Treatment for this condition depends on the cause. This condition may be treated by: Changing your eating and drinking habits, such as: Eating more foods that contain vitamin B12. Drinking less alcohol or no alcohol. Getting vitamin B12 injections. Taking vitamin B12 supplements by mouth (orally). Your health care provider will tell you which dose is best for you. Follow these instructions at home: Eating and drinking  Include foods in your diet that come from animals and contain a lot of vitamin B12. These include: Meats and poultry. This includes beef, pork, chicken, [place], and organ meats, such as liver. Seafood. This includes clams, rainbow trout, salmon, tuna, and haddock. Eggs. Dairy foods such as milk, yogurt, and cheese. Eat foods that have vitamin B12 added to them (are fortified), such as ready-to-eat breakfast cereals. Check the label on the package to see if a food is fortified. The items listed above may not be a complete list of foods and beverages you can eat and drink. Contact a dietitian for more information. Alcohol use Do not drink alcohol if: Your health care provider tells you not to drink. You are pregnant, may be pregnant, or are planning to become pregnant. If you drink alcohol: Limit how much you have to: 0-1 drink a day for women. 0-2 drinks a day for men. Know how much alcohol is in your drink. In the [place] one drink equals one 12 oz bottle of beer (355 mL), one 5 oz glass of wine (148 mL), or one 1 oz glass of hard liquor (44 mL). General instructions Get vitamin B12 injections if told to by your health  care provider. Take supplements only as told by your health care provider. Follow the directions carefully. Keep all follow-up visits. This is important. Contact a health care provider if: Your symptoms come back. Your symptoms get worse or do not improve with treatment. Get help right away: You develop shortness of breath. You have a rapid heart rate. You have chest pain. You become dizzy or you faint. These symptoms may be an emergency. Get help right away. Call 911. Do not wait to see if the symptoms will go away. Do not drive yourself to the hospital. Summary Vitamin B12 deficiency occurs when the body does not have enough of this important vitamin. Common causes include not eating enough foods that contain vitamin B12, not being  able to absorb vitamin B12 from the food that you eat, having a surgery in which part of the stomach or small intestine is removed, or taking certain medicines. Eat foods that have vitamin B12 in them. Treatment may include making a change in the way you eat and drink, getting vitamin B12 injections, or taking vitamin B12 supplements. This information is not intended to replace advice given to you by your health care provider. Make sure you discuss any questions you have with your health care provider. Document Revised: 05/02/2021 Document Reviewed: 05/02/2021 Elsevier Patient Education  [place].

## 2022-07-31 ENCOUNTER — Inpatient Hospital Stay: Payer: Medicare PPO

## 2022-08-09 ENCOUNTER — Other Ambulatory Visit: Payer: Self-pay | Admitting: Oncology

## 2022-08-09 DIAGNOSIS — D509 Iron deficiency anemia, unspecified: Secondary | ICD-10-CM

## 2022-08-09 NOTE — Progress Notes (Deleted)
Patient Care Team: Algis Greenhouse, MD as PCP - General (Family Medicine)  Clinic Day:  08/09/2022  Referring physician: Algis Greenhouse, MD  ASSESSMENT & PLAN:   Assessment & Plan: No problem-specific Assessment & Plan notes found for this encounter.    The patient understands the plans discussed today and is in agreement with them.  She knows to contact our office if she develops concerns prior to her next appointment.    Derwood Kaplan, MD  [facility] [address] [place] [address] Dept: [phone] Dept Fax: [phone]   No orders of the defined types were placed in this encounter.     CHIEF COMPLAINT:  CC: A 71 year old female with history of iron deficiency anemia here for 4 week evaluation  Current Treatment:  Surveillance  INTERVAL HISTORY:  Delisa is here today for repeat clinical assessment. She denies fevers or chills. She denies pain. Her appetite is good. Her weight has been stable.  I have reviewed the past medical history, past surgical history, social history and family history with the patient and they are unchanged from previous note.  ALLERGIES:  is allergic to atorvastatin, other, and rosuvastatin.  MEDICATIONS:  Current Outpatient Medications  Medication Sig Dispense Refill   calcium carbonate (OSCAL) 1500 (600 Ca) MG TABS tablet Take 600 mg by mouth 2 (two) times daily.      cholecalciferol (VITAMIN D3) 25 MCG (1000 UNIT) tablet Take 1,000 Units by mouth in the morning and at bedtime.     DHA-EPA-Flaxseed Oil-Vitamin E (THERA TEARS NUTRITION PO) Take 3 capsules by mouth daily.     diclofenac (VOLTAREN) 75 MG EC tablet Take 75 mg by mouth 2 (two) times daily as needed.     diclofenac (VOLTAREN) 75 MG EC tablet Take 1 tablet by mouth 2 (two) times daily as needed.     diphenhydrAMINE (BENADRYL) 25 MG tablet Take 25 mg by mouth every 6 (six) hours as needed for itching or  allergies.     empagliflozin (JARDIANCE) 25 MG TABS tablet Take 25 mg by mouth daily.     ezetimibe (ZETIA) 10 MG tablet Take 10 mg by mouth daily.     irbesartan (AVAPRO) 150 MG tablet Take 150 mg by mouth daily.     levothyroxine (SYNTHROID) 100 MCG tablet Take 100 mcg by mouth daily before breakfast.     meloxicam (MOBIC) 15 MG tablet meloxicam Take (oral) No date recorded tablet No frequency recorded oral No set duration recorded No set duration amount recorded active 15 mg     meloxicam (MOBIC) 15 MG tablet Take 1 tablet by mouth daily.     metFORMIN (GLUCOPHAGE-XR) 500 MG 24 hr tablet Take 1,000 mg by mouth 2 (two) times daily.     nystatin-triamcinolone ointment (MYCOLOG) Apply 1 application topically 2 (two) times daily. (Patient not taking: Reported on 7/34/2876) 30 g 2   TRULICITY 3 OT/1.5BW SOPN SMARTSIG:3 Milligram(s) SUB-Q Once a Week     No current facility-administered medications for this visit.    HISTORY OF PRESENT ILLNESS:   Oncology History   No history exists.      REVIEW OF SYSTEMS:   Constitutional: Denies fevers, chills or abnormal weight loss Eyes: Denies blurriness of vision Ears, nose, mouth, throat, and face: Denies mucositis or sore throat Respiratory: Denies cough, dyspnea or wheezes Cardiovascular: Denies palpitation, chest discomfort or lower extremity swelling Gastrointestinal:  Denies nausea, heartburn or change  in bowel habits Skin: Denies abnormal skin rashes Lymphatics: Denies new lymphadenopathy or easy bruising Neurological:Denies numbness, tingling or new weaknesses Behavioral/Psych: Mood is stable, no new changes  All other systems were reviewed with the patient and are negative.   VITALS:  Last menstrual period 02/20/2000.  Wt Readings from Last 3 Encounters:  07/03/22 178 lb 1.3 oz (80.8 kg)  05/08/22 187 lb 14.4 oz (85.2 kg)  05/08/22 187 lb 14.4 oz (85.2 kg)    There is no height or weight on file to calculate BMI.  Performance  status (ECOG): 1 - Symptomatic but completely ambulatory  PHYSICAL EXAM:   GENERAL:alert, no distress and comfortable SKIN: skin color, texture, turgor are normal, no rashes or significant lesions EYES: normal, Conjunctiva are pink and non-injected, sclera clear OROPHARYNX:no exudate, no erythema and lips, buccal mucosa, and tongue normal  NECK: supple, thyroid normal size, non-tender, without nodularity LYMPH:  no palpable lymphadenopathy in the cervical, axillary or inguinal LUNGS: clear to auscultation and percussion with normal breathing effort HEART: regular rate & rhythm and no murmurs and no lower extremity edema ABDOMEN:abdomen soft, non-tender and normal bowel sounds Musculoskeletal:no cyanosis of digits and no clubbing  NEURO: alert & oriented x 3 with fluent speech, no focal motor/sensory deficits  LABORATORY DATA:  I have reviewed the data as listed No results found for: "NA", "K", "CL", "CO2", "GLUCOSE", "BUN", "CREATININE", "CALCIUM", "PROT", "ALBUMIN", "AST", "ALT", "ALKPHOS", "BILITOT", "GFRNONAA", "GFRAA"  No results found for: "SPEP", "UPEP"  Lab Results  Component Value Date   WBC 8.0 05/08/2022   NEUTROABS 5.84 05/08/2022   HGB 14.2 05/08/2022   HCT 44 05/08/2022   PLT 342 05/08/2022      Chemistry   No results found for: "NA", "K", "CL", "CO2", "BUN", "CREATININE", "GLU" No results found for: "CALCIUM", "ALKPHOS", "AST", "ALT", "BILITOT"     RADIOGRAPHIC STUDIES: I have personally reviewed the radiological images as listed and agreed with the findings in the report. No results found.

## 2022-08-10 ENCOUNTER — Ambulatory Visit: Payer: Medicare PPO

## 2022-08-10 ENCOUNTER — Other Ambulatory Visit: Payer: Self-pay | Admitting: Oncology

## 2022-08-10 ENCOUNTER — Inpatient Hospital Stay: Payer: Medicare PPO | Attending: Oncology | Admitting: Oncology

## 2022-08-10 ENCOUNTER — Encounter: Payer: Self-pay | Admitting: Oncology

## 2022-08-10 ENCOUNTER — Telehealth: Payer: Self-pay | Admitting: Oncology

## 2022-08-10 ENCOUNTER — Inpatient Hospital Stay: Payer: Medicare PPO

## 2022-08-10 VITALS — BP 167/85 | HR 78 | Temp 97.5°F | Resp 18 | Ht 65.0 in | Wt 187.8 lb

## 2022-08-10 VITALS — BP 157/69 | HR 74 | Temp 97.6°F | Resp 20 | Ht 65.0 in | Wt 188.0 lb

## 2022-08-10 DIAGNOSIS — D509 Iron deficiency anemia, unspecified: Secondary | ICD-10-CM | POA: Diagnosis not present

## 2022-08-10 DIAGNOSIS — D519 Vitamin B12 deficiency anemia, unspecified: Secondary | ICD-10-CM | POA: Diagnosis not present

## 2022-08-10 LAB — CBC WITH DIFFERENTIAL (CANCER CENTER ONLY)
Abs Immature Granulocytes: 0.06 10*3/uL (ref 0.00–0.07)
Basophils Absolute: 0.1 10*3/uL (ref 0.0–0.1)
Basophils Relative: 1 %
Eosinophils Absolute: 0.1 10*3/uL (ref 0.0–0.5)
Eosinophils Relative: 1 %
HCT: 46 % (ref 36.0–46.0)
Hemoglobin: 14.8 g/dL (ref 12.0–15.0)
Immature Granulocytes: 1 %
Lymphocytes Relative: 22 %
Lymphs Abs: 2.3 10*3/uL (ref 0.7–4.0)
MCH: 32 pg (ref 26.0–34.0)
MCHC: 32.2 g/dL (ref 30.0–36.0)
MCV: 99.4 fL (ref 80.0–100.0)
Monocytes Absolute: 0.6 10*3/uL (ref 0.1–1.0)
Monocytes Relative: 6 %
Neutro Abs: 7.5 10*3/uL (ref 1.7–7.7)
Neutrophils Relative %: 69 %
Platelet Count: 378 10*3/uL (ref 150–400)
RBC: 4.63 MIL/uL (ref 3.87–5.11)
RDW: 13.7 % (ref 11.5–15.5)
WBC Count: 10.6 10*3/uL — ABNORMAL HIGH (ref 4.0–10.5)
nRBC: 0 % (ref 0.0–0.2)

## 2022-08-10 LAB — CMP (CANCER CENTER ONLY)
ALT: 32 U/L (ref 0–44)
AST: 18 U/L (ref 15–41)
Albumin: 4 g/dL (ref 3.5–5.0)
Alkaline Phosphatase: 42 U/L (ref 38–126)
Anion gap: 10 (ref 5–15)
BUN: 18 mg/dL (ref 8–23)
CO2: 26 mmol/L (ref 22–32)
Calcium: 10.3 mg/dL (ref 8.9–10.3)
Chloride: 104 mmol/L (ref 98–111)
Creatinine: 0.58 mg/dL (ref 0.44–1.00)
GFR, Estimated: >60 mL/min (ref >60)
Glucose, Bld: 164 mg/dL — ABNORMAL HIGH (ref 70–99)
Potassium: 3.6 mmol/L (ref 3.5–5.1)
Sodium: 140 mmol/L (ref 135–145)
Total Bilirubin: 1.5 mg/dL — ABNORMAL HIGH (ref 0.3–1.2)
Total Protein: 7.4 g/dL (ref 6.5–8.1)

## 2022-08-10 LAB — VITAMIN B12: Vitamin B-12: 175 pg/mL — ABNORMAL LOW (ref 180–914)

## 2022-08-10 LAB — IRON AND TIBC
Iron: 124 ug/dL (ref 28–170)
Saturation Ratios: 34 % — ABNORMAL HIGH (ref 10.4–31.8)
TIBC: 364 ug/dL (ref 250–450)
UIBC: 240 ug/dL

## 2022-08-10 LAB — FERRITIN: Ferritin: 101 ng/mL (ref 11–307)

## 2022-08-10 MED ORDER — CYANOCOBALAMIN 1000 MCG/ML IJ SOLN
1000.0000 ug | Freq: Once | INTRAMUSCULAR | Status: AC
  Administered 2022-08-10: 1000 ug via INTRAMUSCULAR
  Filled 2022-08-10: qty 1

## 2022-08-10 NOTE — Patient Instructions (Signed)

## 2022-08-10 NOTE — Progress Notes (Signed)
Patient Care Team: Kimberly Greenhouse, MD as PCP - General (Family Medicine)  Clinic Day:  08/10/22  Referring physician: Algis Greenhouse, MD  ASSESSMENT & PLAN:   Assessment & Plan:  Iron deficiency anemia She has only had a partial response to oral supplement despite being on this for 6 months.  I think she has been compliant.  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 She received IV iron in June of 2023.  Strong family history for malignancy This is outlined below and includes ovarian and colon cancer.  I do recommend a genetics clinic referral for further evaluation and recommendations.   B12 Deficiency B12 level was 156 on 03/05/22. B12 level for today is pending. She will continue monthly B12 injections.    Plan: She is doing much better on B12 injections. Her B12 level was low in June 2023. HGB 14.2 and platelets of 342 on 05/08/22. Labs from today are pending. I have checked for other causes of her anemia including folate, iron studies for multiple myeloma or hemolytic anemia and these were unremarkable. We have concluded that her B12 deficiency is contributing to her anemia. Her iron studies in August showed improvement. I will plan to see her back in 6 months with repeat CBC and iron studies. I discussed the assessment and treatment plan with the patient. The patient was provided an opportunity to ask questions and all were answered.  The patient agreed with the plan and demonstrated an understanding of the instructions.  The patient was advised to call back if the symptoms worsen or if the condition fails to improve as anticipated.   I provided 20 minutes  of face-to-face time during this this encounter and > 50% was spent counseling as documented under my assessment and plan.    ADDENDUM: Her B12 level is still low at 175 despite monthly injections, so I will increase to every 2  weeks.    Kimberly Kaplan, MD  [facility] [address] [place] [address] Dept: [phone] Dept Fax: [phone]   No orders of the defined types were placed in this encounter.    CHIEF COMPLAINT:  CC: A 71 y.o. female with history of iron deficiency anemia and B12 deficiency  Current Treatment:  Surveillance  INTERVAL HISTORY:  Kimberly Barker is here today for repeat clinical assessment for iron deficiency anemia and B12 deficiency. She received IV iron in June and was placed in B12 injections in August. She states that she missed her last B12 injection as she was not feeling well that day. She plans to have the injection today instead. We will continue with monthly B12 injections. She reports feeling much better in particular to her energy level since starting the B12. She denies any family history of pernicious anemia or B12 deficiency. Her HGB was 14.2 and platelets were 342 on 05/08/22. She has had issues with back and hip pain secondary to arthritis. She denies signs of infection such as sore throat, sinus drainage, cough, or urinary symptoms.  She denies fevers or recurrent chills. She denies nausea, vomiting, chest pain, dyspnea or cough. Her weight has been stable.   I have reviewed the past medical history, past surgical history, social history and family history with the patient and they are unchanged from previous note.  ALLERGIES:  is allergic to atorvastatin, other, and rosuvastatin.  MEDICATIONS:  Current Outpatient Medications  Medication Sig Dispense Refill   predniSONE (DELTASONE) 5 MG tablet TAKE 6 TABLETS DAILY FOR 4 DAYS, 4 TABLETS DAILY FOR 4 DAYS THEN 2 TABLETS DAILY FOR 4 DAYS     calcium carbonate (OSCAL) 1500 (600 Ca) MG TABS tablet Take 600 mg by mouth 2 (two) times daily.      cholecalciferol (VITAMIN D3) 25 MCG (1000 UNIT) tablet Take 1,000 Units by mouth in the morning and at bedtime.      DHA-EPA-Flaxseed Oil-Vitamin E (THERA TEARS NUTRITION PO) Take 3 capsules by mouth daily.     diclofenac (VOLTAREN) 75 MG EC tablet Take 75 mg by mouth 2 (two) times daily as needed.     diphenhydrAMINE (BENADRYL) 25 MG tablet Take 25 mg by mouth every 6 (six) hours as needed for itching or allergies.     empagliflozin (JARDIANCE) 25 MG TABS tablet Take 25 mg by mouth daily.     ezetimibe (ZETIA) 10 MG tablet Take 10 mg by mouth daily.     irbesartan (AVAPRO) 150 MG tablet Take 150 mg by mouth daily.     levothyroxine (SYNTHROID) 100 MCG tablet Take 100 mcg by mouth daily before breakfast.     meloxicam (MOBIC) 15 MG tablet Take 1 tablet by mouth daily.     metFORMIN (GLUCOPHAGE-XR) 500 MG 24 hr tablet Take 1,000 mg by mouth 2 (two) times daily.     nystatin-triamcinolone ointment (MYCOLOG) Apply 1 application topically 2 (two) times daily. (Patient not taking: Reported on 3/00/9233) 30 g 2   TRULICITY 3 AQ/7.6AU SOPN SMARTSIG:3 Milligram(s) SUB-Q Once a Week     No current facility-administered medications for this visit.    HISTORY OF PRESENT ILLNESS:   Oncology History   No history exists.      REVIEW OF SYSTEMS:   Review of Systems  Constitutional:  Negative for appetite change, chills, fever and unexpected weight change.  HENT:  Negative.  Negative for lump/mass, mouth sores and sore throat.   Eyes: Negative.   Respiratory: Negative.  Negative for chest tightness, cough, hemoptysis, shortness of breath and wheezing.   Cardiovascular: Negative.  Negative for chest pain, leg swelling and palpitations.  Gastrointestinal: Negative.  Negative for abdominal distention, abdominal pain, blood in stool, constipation, diarrhea, nausea and vomiting.  Endocrine: Negative.   Genitourinary: Negative.  Negative for difficulty urinating, dysuria, frequency and hematuria.   Musculoskeletal:  Negative for arthralgias, back pain, flank pain and gait problem.  Skin: Negative.   Neurological:   Negative for dizziness, extremity weakness, gait problem, headaches, light-headedness, numbness, seizures and speech difficulty.  Hematological: Negative.  Negative for adenopathy. Does not bruise/bleed easily.  Psychiatric/Behavioral: Negative.  Negative for depression and sleep disturbance. The patient is not nervous/anxious.     VITALS:  Blood pressure (!) 167/85, pulse 78, temperature (!) 97.5 F (36.4 C), temperature source Oral, resp. rate 18, height _0  (1.651 m), weight 187 lb 12.8 oz (85.2 kg), last menstrual period 02/20/2000, SpO2 94 %.  Wt Readings from Last 3 Encounters:  08/10/22 188 lb (85.3 kg)  08/10/22 187 lb 12.8 oz (85.2 kg)  07/03/22 178 lb 1.3 oz (80.8 kg)    Body mass index is 31.25 kg/m.  Performance status (ECOG): 1 - Symptomatic but completely ambulatory  PHYSICAL EXAM:   Physical Exam Constitutional:      General: She is not in acute distress.    Appearance: Normal appearance. She is normal weight.  HENT:  Head: Normocephalic and atraumatic.  Eyes:     General: No scleral icterus.    Extraocular Movements: Extraocular movements intact.     Conjunctiva/sclera: Conjunctivae normal.     Pupils: Pupils are equal, round, and reactive to light.  Cardiovascular:     Rate and Rhythm: Normal rate and regular rhythm.     Pulses: Normal pulses.     Heart sounds: Normal heart sounds. No murmur heard.    No friction rub. No gallop.  Pulmonary:     Effort: Pulmonary effort is normal. No respiratory distress.     Breath sounds: Normal breath sounds.  Abdominal:     General: Bowel sounds are normal. There is no distension.     Palpations: Abdomen is soft. There is no hepatomegaly, splenomegaly or mass.     Tenderness: There is no abdominal tenderness.  Musculoskeletal:        General: Normal range of motion.     Cervical back: Normal range of motion and neck supple.     Right lower leg: No edema.     Left lower leg: No edema.  Lymphadenopathy:      Cervical: No cervical adenopathy.  Skin:    General: Skin is warm and dry.  Neurological:     General: No focal deficit present.     Mental Status: She is alert and oriented to person, place, and time. Mental status is at baseline.  Psychiatric:        Mood and Affect: Mood normal.        Behavior: Behavior normal.        Thought Content: Thought content normal.        Judgment: Judgment normal.      LABORATORY DATA:  I have reviewed the data as listed    Component Value Date/Time   NA 140 08/10/2022 0955   K 3.6 08/10/2022 0955   CL 104 08/10/2022 0955   CO2 26 08/10/2022 0955   GLUCOSE 164 (H) 08/10/2022 0955   BUN 18 08/10/2022 0955   CREATININE 0.58 08/10/2022 0955   CALCIUM 10.3 08/10/2022 0955   PROT 7.4 08/10/2022 0955   ALBUMIN 4.0 08/10/2022 0955   AST 18 08/10/2022 0955   ALT 32 08/10/2022 0955   ALKPHOS 42 08/10/2022 0955   BILITOT 1.5 (H) 08/10/2022 0955   GFRNONAA >60 08/10/2022 0955    No results found for: "SPEP", "UPEP"      Latest Ref Rng & Units 08/10/2022    9:55 AM 05/08/2022   12:00 AM 03/05/2022   12:00 AM  CBC  WBC 4.0 - 10.5 K/uL 10.6  8.0     8.5      Hemoglobin 12.0 - 15.0 g/dL 14.8  14.2     10.9      Hematocrit 36.0 - 46.0 % 46.0  44     37      Platelets 150 - 400 K/uL 378  342     425         This result is from an external source.     RADIOGRAPHIC STUDIES: I have personally reviewed the radiological images as listed and agreed with the findings in the report. No results found.  DIGITAL SCREENING BILATERAL MAMMOGRAM WITH TOMOSYNTHESIS AND CAD 04/16/2022 TECHNIQUE: Bilateral screening digital craniocaudal and mediolateral oblique mammograms were obtained. Bilateral screening digital breast tomosynthesis was performed. The images were evaluated with computer-aided detection. COMPARISON:  Previous exam(s). ACR Breast Density Category a: The breast tissue is almost entirely  fatty. FINDINGS: There are no findings suspicious for  malignancy IMPRESSION: No mammographic evidence of malignancy. A result letter of this screening mammogram will be mailed directly to the patient. RECOMMENDATION: Screening mammogram in one year. (Code:SM-B-01Y) BI-RADS CATEGORY  1: Negative.    Bone Density scan 03/12/2021 IMPRESSION: Referring Physician:  Nunzio Cobbs Your patient completed a bone mineral density test using GE Lunar iDXA system (analysis version: 16). Technologist: [place] PATIENT: Name: Kimberly Barker, Kimberly Barker Patient ID: [id number] Birth Date: 09-28-1950 Height: 65.5 in. Sex: Female Measured: 03/12/2021 Weight: 197.4 lbs. Indications: Advanced Age, Bilateral Ovariectomy (65.51), Caucasian, Diabetic non insulin, Estrogen Deficient, Hypothyroid, Hysterectomy, Levothyroxine, Nexium, Postmenopausal, Secondary Osteoporosis Fractures: Right Ankle Treatments: Calcium (E943.0), Vitamin D (E933.5)   ASSESSMENT: The BMD measured at Femur Neck Right is 0.792 g/cm2 with a T-score of -1.8. This patient is considered osteopenic/low bone mass according to [facility]) criteria.   The quality of the exam is good. L3 was excluded due to degenerative changes.   Site Region Measured Date Measured Age YA BMD Significant CHANGE T-score AP Spine  L1-L4 (L3) 03/12/2021    70.0         -1.6    0.976 g/cm2 AP Spine  L1-L4 (L3) 10/24/2018    67.6         -1.4    1.003 g/cm2   DualFemur Neck Right 3/12/2021    70.0         -1.8    0.792 g/cm2 DualFemur Neck Right 10/24/2018 67.6 -1.7 0.802 g/cm2 *   DualFemur Total Mean 03/12/2021 70.0 -0.9 0.896 g/cm2 * DualFemur Total Mean 10/24/2018 67.6 -0.6 0.936 g/cm2 *   World Health Organization [facility]) criteria for post-menopausal, Caucasian Women: Normal       T-score at or above -1 SD Osteopenia   T-score between -1 and -2.5 SD Osteoporosis T-score at or below -2.5 SD   RECOMMENDATION: 1. All patients should optimize calcium and vitamin D intake. 2. Consider  FDA-approved medical therapies in postmenopausal women and men aged 83 years and older, based on the following: a. A hip or vertebral (clinical or morphometric) fracture. b. T-score = -2.5 at the femoral neck or spine after appropriate evaluation to exclude secondary causes. c. Low bone mass (T-score between -1.0 and -2.5 at the femoral neck or spine) and a 10-year probability of a hip fracture = 3% or a 10-year probability of a major osteoporosis-related fracture = 20% based on the [place]-adapted WHO algorithm. d. Clinician judgment and/or patient preferences may indicate treatment for people with 10-year fracture probabilities above or below these levels.   FOLLOW-UP: Patients with diagnosis of osteoporosis or at high risk for fracture should have regular bone mineral density tests.? Patients eligible for Medicare are allowed routine testing every 2 years.? The testing frequency can be increased to one year for patients who have rapidly progressing disease, are receiving or discontinuing medical therapy to restore bone mass, or have additional risk factors.   I have reviewed this study and agree with the findings. [facility]   FRAX* 10-year Probability of Fracture Based on femoral neck BMD: DualFemur (Right)   Major Osteoporotic Fracture: 10.2% Hip Fracture:                1.7% Population:                  [place] (Caucasian) Risk Factors:  Secondary Osteoporosis   *FRAX is a Materials engineer of the [employer] for Metabolic Bone Disease, a World Pharmacologist (WHO) [facility]. ASSESSMENT: The probability of a major osteoporotic fracture is 10.2% within the next ten years.   The probability of a hip fracture is 1.7% within the next ten years.     I,Alexis Herring,acting as a scribe for Kimberly Kaplan, MD.,have documented all relevant documentation on the behalf of Kimberly Kaplan, MD,as directed  by  Kimberly Kaplan, MD while in the presence of Kimberly Kaplan, MD.  I have reviewed this report as typed by the medical scribe, and it is complete and accurate.  Kimberly Barker   08/28/22 7:41 AM

## 2022-08-10 NOTE — Telephone Encounter (Signed)
Patient has been scheduled for follow-up visit per 08/10/22 los. Pt given an appt calendar with date and time.  

## 2022-08-10 NOTE — Addendum Note (Signed)
Addended by: Juanetta Beets on: 08/10/2022 11:33 AM   Modules accepted: Orders

## 2022-08-12 ENCOUNTER — Encounter: Payer: Self-pay | Admitting: Oncology

## 2022-08-12 NOTE — Addendum Note (Signed)
Addended by: Juanetta Beets on: 08/12/2022 03:19 PM   Modules accepted: Orders

## 2022-08-17 ENCOUNTER — Telehealth: Payer: Self-pay

## 2022-08-17 NOTE — Telephone Encounter (Signed)
-----   Message from Derwood Kaplan, MD sent at 08/12/2022  9:04 AM EST ----- Regarding: call Tell her BS 164 but rest looks good, including iron studies. But B12 still low at 175 despite injections. I rec. B12 injections every 2 weeks for the next few months to build up level. Hgb up to 14.8

## 2022-08-18 ENCOUNTER — Encounter: Payer: Self-pay | Admitting: Oncology

## 2022-08-24 ENCOUNTER — Inpatient Hospital Stay: Payer: Medicare PPO | Attending: Oncology

## 2022-08-24 VITALS — BP 154/82 | HR 81 | Temp 98.3°F | Resp 20

## 2022-08-24 DIAGNOSIS — E538 Deficiency of other specified B group vitamins: Secondary | ICD-10-CM | POA: Insufficient documentation

## 2022-08-24 DIAGNOSIS — D509 Iron deficiency anemia, unspecified: Secondary | ICD-10-CM | POA: Diagnosis present

## 2022-08-24 MED ORDER — CYANOCOBALAMIN 1000 MCG/ML IJ SOLN
1000.0000 ug | Freq: Once | INTRAMUSCULAR | Status: AC
  Administered 2022-08-24: 1000 ug via INTRAMUSCULAR
  Filled 2022-08-24: qty 1

## 2022-08-24 NOTE — Patient Instructions (Signed)

## 2022-08-28 ENCOUNTER — Inpatient Hospital Stay: Payer: Medicare PPO

## 2022-08-28 ENCOUNTER — Encounter: Payer: Self-pay | Admitting: Oncology

## 2022-09-07 ENCOUNTER — Inpatient Hospital Stay: Payer: Medicare PPO

## 2022-09-07 VITALS — BP 148/74 | HR 87 | Temp 98.3°F | Resp 18

## 2022-09-07 DIAGNOSIS — E538 Deficiency of other specified B group vitamins: Secondary | ICD-10-CM | POA: Diagnosis not present

## 2022-09-07 DIAGNOSIS — D509 Iron deficiency anemia, unspecified: Secondary | ICD-10-CM

## 2022-09-07 MED ORDER — CYANOCOBALAMIN 1000 MCG/ML IJ SOLN
1000.0000 ug | Freq: Once | INTRAMUSCULAR | Status: AC
  Administered 2022-09-07: 1000 ug via INTRAMUSCULAR
  Filled 2022-09-07: qty 1

## 2022-09-07 NOTE — Patient Instructions (Signed)

## 2022-09-09 ENCOUNTER — Inpatient Hospital Stay: Payer: Medicare PPO

## 2022-09-18 ENCOUNTER — Encounter: Payer: Self-pay | Admitting: Oncology

## 2022-09-18 NOTE — Addendum Note (Signed)
Addended by: Juanetta Beets on: 09/18/2022 11:54 AM   Modules accepted: Orders

## 2022-09-22 ENCOUNTER — Inpatient Hospital Stay: Payer: Medicare PPO | Attending: Oncology

## 2022-09-22 VITALS — BP 163/75 | HR 82 | Temp 98.0°F | Resp 18 | Ht 65.0 in | Wt 188.0 lb

## 2022-09-22 DIAGNOSIS — D509 Iron deficiency anemia, unspecified: Secondary | ICD-10-CM | POA: Insufficient documentation

## 2022-09-22 DIAGNOSIS — E538 Deficiency of other specified B group vitamins: Secondary | ICD-10-CM | POA: Diagnosis present

## 2022-09-22 MED ORDER — CYANOCOBALAMIN 1000 MCG/ML IJ SOLN
1000.0000 ug | Freq: Once | INTRAMUSCULAR | Status: AC
  Administered 2022-09-22: 1000 ug via INTRAMUSCULAR
  Filled 2022-09-22: qty 1

## 2022-09-22 NOTE — Patient Instructions (Signed)

## 2022-09-25 ENCOUNTER — Inpatient Hospital Stay: Payer: Medicare PPO

## 2022-10-06 ENCOUNTER — Inpatient Hospital Stay: Payer: Medicare PPO

## 2022-10-06 VITALS — BP 138/84 | HR 93 | Resp 18 | Wt 190.1 lb

## 2022-10-06 DIAGNOSIS — E538 Deficiency of other specified B group vitamins: Secondary | ICD-10-CM | POA: Diagnosis not present

## 2022-10-06 DIAGNOSIS — D509 Iron deficiency anemia, unspecified: Secondary | ICD-10-CM

## 2022-10-06 MED ORDER — CYANOCOBALAMIN 1000 MCG/ML IJ SOLN
1000.0000 ug | Freq: Once | INTRAMUSCULAR | Status: AC
  Administered 2022-10-06: 1000 ug via INTRAMUSCULAR
  Filled 2022-10-06: qty 1

## 2022-10-06 NOTE — Patient Instructions (Signed)

## 2022-10-12 ENCOUNTER — Encounter (HOSPITAL_BASED_OUTPATIENT_CLINIC_OR_DEPARTMENT_OTHER): Payer: Self-pay

## 2022-10-12 ENCOUNTER — Emergency Department (HOSPITAL_COMMUNITY): Payer: Medicare PPO

## 2022-10-12 ENCOUNTER — Encounter (HOSPITAL_COMMUNITY): Payer: Self-pay

## 2022-10-12 ENCOUNTER — Inpatient Hospital Stay: Payer: Medicare PPO

## 2022-10-12 ENCOUNTER — Emergency Department (HOSPITAL_BASED_OUTPATIENT_CLINIC_OR_DEPARTMENT_OTHER): Payer: Medicare PPO

## 2022-10-12 ENCOUNTER — Inpatient Hospital Stay (HOSPITAL_BASED_OUTPATIENT_CLINIC_OR_DEPARTMENT_OTHER)
Admission: EM | Admit: 2022-10-12 | Discharge: 2022-10-14 | DRG: 092 | Disposition: A | Discharge status: Home Health | Payer: Medicare PPO | Attending: Internal Medicine | Admitting: Internal Medicine

## 2022-10-12 ENCOUNTER — Other Ambulatory Visit: Payer: Self-pay

## 2022-10-12 DIAGNOSIS — E785 Hyperlipidemia, unspecified: Secondary | ICD-10-CM | POA: Diagnosis present

## 2022-10-12 DIAGNOSIS — I493 Ventricular premature depolarization: Secondary | ICD-10-CM | POA: Diagnosis present

## 2022-10-12 DIAGNOSIS — T426X5A Adverse effect of other antiepileptic and sedative-hypnotic drugs, initial encounter: Secondary | ICD-10-CM | POA: Diagnosis present

## 2022-10-12 DIAGNOSIS — K219 Gastro-esophageal reflux disease without esophagitis: Secondary | ICD-10-CM | POA: Diagnosis present

## 2022-10-12 DIAGNOSIS — E559 Vitamin D deficiency, unspecified: Secondary | ICD-10-CM | POA: Diagnosis present

## 2022-10-12 DIAGNOSIS — E872 Acidosis, unspecified: Secondary | ICD-10-CM | POA: Insufficient documentation

## 2022-10-12 DIAGNOSIS — D75839 Thrombocytosis, unspecified: Secondary | ICD-10-CM | POA: Insufficient documentation

## 2022-10-12 DIAGNOSIS — Z791 Long term (current) use of non-steroidal anti-inflammatories (NSAID): Secondary | ICD-10-CM

## 2022-10-12 DIAGNOSIS — E039 Hypothyroidism, unspecified: Secondary | ICD-10-CM | POA: Insufficient documentation

## 2022-10-12 DIAGNOSIS — R112 Nausea with vomiting, unspecified: Secondary | ICD-10-CM | POA: Insufficient documentation

## 2022-10-12 DIAGNOSIS — G928 Other toxic encephalopathy: Principal | ICD-10-CM | POA: Diagnosis present

## 2022-10-12 DIAGNOSIS — Z87440 Personal history of urinary (tract) infections: Secondary | ICD-10-CM

## 2022-10-12 DIAGNOSIS — E871 Hypo-osmolality and hyponatremia: Secondary | ICD-10-CM | POA: Insufficient documentation

## 2022-10-12 DIAGNOSIS — I10 Essential (primary) hypertension: Secondary | ICD-10-CM | POA: Insufficient documentation

## 2022-10-12 DIAGNOSIS — D751 Secondary polycythemia: Secondary | ICD-10-CM | POA: Insufficient documentation

## 2022-10-12 DIAGNOSIS — Z7984 Long term (current) use of oral hypoglycemic drugs: Secondary | ICD-10-CM

## 2022-10-12 DIAGNOSIS — E860 Dehydration: Secondary | ICD-10-CM | POA: Diagnosis not present

## 2022-10-12 DIAGNOSIS — F419 Anxiety disorder, unspecified: Secondary | ICD-10-CM | POA: Diagnosis present

## 2022-10-12 DIAGNOSIS — Z1152 Encounter for screening for COVID-19: Secondary | ICD-10-CM

## 2022-10-12 DIAGNOSIS — L89526 Pressure-induced deep tissue damage of left ankle: Secondary | ICD-10-CM | POA: Diagnosis present

## 2022-10-12 DIAGNOSIS — Z8673 Personal history of transient ischemic attack (TIA), and cerebral infarction without residual deficits: Secondary | ICD-10-CM

## 2022-10-12 DIAGNOSIS — G8929 Other chronic pain: Secondary | ICD-10-CM | POA: Diagnosis present

## 2022-10-12 DIAGNOSIS — M1612 Unilateral primary osteoarthritis, left hip: Secondary | ICD-10-CM | POA: Diagnosis present

## 2022-10-12 DIAGNOSIS — Z6831 Body mass index (BMI) 31.0-31.9, adult: Secondary | ICD-10-CM

## 2022-10-12 DIAGNOSIS — E119 Type 2 diabetes mellitus without complications: Secondary | ICD-10-CM

## 2022-10-12 DIAGNOSIS — Z820 Family history of epilepsy and other diseases of the nervous system: Secondary | ICD-10-CM

## 2022-10-12 DIAGNOSIS — L89516 Pressure-induced deep tissue damage of right ankle: Secondary | ICD-10-CM | POA: Diagnosis present

## 2022-10-12 DIAGNOSIS — E669 Obesity, unspecified: Secondary | ICD-10-CM | POA: Diagnosis present

## 2022-10-12 DIAGNOSIS — G9341 Metabolic encephalopathy: Secondary | ICD-10-CM | POA: Diagnosis present

## 2022-10-12 DIAGNOSIS — Z800 Family history of malignant neoplasm of digestive organs: Secondary | ICD-10-CM

## 2022-10-12 DIAGNOSIS — Z8041 Family history of malignant neoplasm of ovary: Secondary | ICD-10-CM

## 2022-10-12 DIAGNOSIS — Z79890 Hormone replacement therapy (postmenopausal): Secondary | ICD-10-CM

## 2022-10-12 DIAGNOSIS — Z888 Allergy status to other drugs, medicaments and biological substances status: Secondary | ICD-10-CM

## 2022-10-12 DIAGNOSIS — M25552 Pain in left hip: Secondary | ICD-10-CM | POA: Diagnosis present

## 2022-10-12 DIAGNOSIS — R9431 Abnormal electrocardiogram [ECG] [EKG]: Secondary | ICD-10-CM | POA: Insufficient documentation

## 2022-10-12 DIAGNOSIS — Z8249 Family history of ischemic heart disease and other diseases of the circulatory system: Secondary | ICD-10-CM

## 2022-10-12 DIAGNOSIS — N19 Unspecified kidney failure: Principal | ICD-10-CM

## 2022-10-12 DIAGNOSIS — G9340 Encephalopathy, unspecified: Secondary | ICD-10-CM | POA: Diagnosis present

## 2022-10-12 DIAGNOSIS — R197 Diarrhea, unspecified: Secondary | ICD-10-CM | POA: Insufficient documentation

## 2022-10-12 DIAGNOSIS — K529 Noninfective gastroenteritis and colitis, unspecified: Secondary | ICD-10-CM | POA: Diagnosis present

## 2022-10-12 DIAGNOSIS — Z79899 Other long term (current) drug therapy: Secondary | ICD-10-CM

## 2022-10-12 DIAGNOSIS — Z8262 Family history of osteoporosis: Secondary | ICD-10-CM

## 2022-10-12 DIAGNOSIS — R410 Disorientation, unspecified: Secondary | ICD-10-CM

## 2022-10-12 DIAGNOSIS — Z833 Family history of diabetes mellitus: Secondary | ICD-10-CM

## 2022-10-12 LAB — URINALYSIS, ROUTINE W REFLEX MICROSCOPIC
Glucose, UA: >=500 mg/dL — AB
Hgb urine dipstick: NEGATIVE
Ketones, ur: 40 mg/dL — AB
Leukocytes,Ua: NEGATIVE
Nitrite: NEGATIVE
Protein, ur: 100 mg/dL — AB
Specific Gravity, Urine: 1.025 (ref 1.005–1.030)
pH: 6 (ref 5.0–8.0)

## 2022-10-12 LAB — COMPREHENSIVE METABOLIC PANEL
ALT: 20 U/L (ref 0–44)
AST: 16 U/L (ref 15–41)
Albumin: 4.3 g/dL (ref 3.5–5.0)
Alkaline Phosphatase: 67 U/L (ref 38–126)
Anion gap: 15 (ref 5–15)
BUN: 42 mg/dL — ABNORMAL HIGH (ref 8–23)
CO2: 17 mmol/L — ABNORMAL LOW (ref 22–32)
Calcium: 11.2 mg/dL — ABNORMAL HIGH (ref 8.9–10.3)
Chloride: 100 mmol/L (ref 98–111)
Creatinine, Ser: 0.94 mg/dL (ref 0.44–1.00)
GFR, Estimated: >60 mL/min (ref >60)
Glucose, Bld: 169 mg/dL — ABNORMAL HIGH (ref 70–99)
Potassium: 3.7 mmol/L (ref 3.5–5.1)
Sodium: 132 mmol/L — ABNORMAL LOW (ref 135–145)
Total Bilirubin: 1.9 mg/dL — ABNORMAL HIGH (ref 0.3–1.2)
Total Protein: 8.2 g/dL — ABNORMAL HIGH (ref 6.5–8.1)

## 2022-10-12 LAB — CBC
HCT: 51.9 % — ABNORMAL HIGH (ref 36.0–46.0)
Hemoglobin: 17.7 g/dL — ABNORMAL HIGH (ref 12.0–15.0)
MCH: 32.3 pg (ref 26.0–34.0)
MCHC: 34.1 g/dL (ref 30.0–36.0)
MCV: 94.7 fL (ref 80.0–100.0)
Platelets: 412 10*3/uL — ABNORMAL HIGH (ref 150–400)
RBC: 5.48 MIL/uL — ABNORMAL HIGH (ref 3.87–5.11)
RDW: 12.2 % (ref 11.5–15.5)
WBC: 12.5 10*3/uL — ABNORMAL HIGH (ref 4.0–10.5)
nRBC: 0 % (ref 0.0–0.2)

## 2022-10-12 LAB — RESP PANEL BY RT-PCR (RSV, FLU A&B, COVID)  RVPGX2
Influenza A by PCR: NEGATIVE
Influenza B by PCR: NEGATIVE
Resp Syncytial Virus by PCR: NEGATIVE
SARS Coronavirus 2 by RT PCR: NEGATIVE

## 2022-10-12 LAB — URINALYSIS, MICROSCOPIC (REFLEX)

## 2022-10-12 LAB — LIPASE, BLOOD: Lipase: 35 U/L (ref 11–51)

## 2022-10-12 MED ORDER — IOHEXOL 300 MG/ML  SOLN
100.0000 mL | Freq: Once | INTRAMUSCULAR | Status: AC | PRN
  Administered 2022-10-12: 100 mL via INTRAVENOUS

## 2022-10-12 MED ORDER — SODIUM CHLORIDE 0.9 % IV BOLUS (SEPSIS)
1000.0000 mL | Freq: Once | INTRAVENOUS | Status: AC
  Administered 2022-10-12: 1000 mL via INTRAVENOUS

## 2022-10-12 MED ORDER — SODIUM CHLORIDE 0.9 % IV SOLN
1000.0000 mL | INTRAVENOUS | Status: DC
  Administered 2022-10-12: 1000 mL via INTRAVENOUS

## 2022-10-12 NOTE — ED Triage Notes (Signed)
States has had nausea & some vomiting since Thursday, fatigue. Hasn't been able to eat since then.

## 2022-10-12 NOTE — ED Provider Triage Note (Signed)
Emergency Medicine Provider Triage Evaluation Note  Kimberly Barker , a 72 y.o. female  was evaluated in triage.  Pt complains of nausea, vomiting, diarrhea, and decreased appetite x 4 days. Generalized weakness and increased confusion for over a month per sister. Frequent UTIs. Last diagnosed at urgent care one week ago, placed on Cefdinir, completed 4 days so far. Pt c/o left hip pain making it more difficult to ambulate. Has been using a walker to assist since onset of this pain. No fall or trauma to hip.   Review of Systems  Positive: See HPI Negative: See HPI  Physical Exam  BP 136/87 (BP Location: Left Arm)   Pulse 91   Temp 98.7 F (37.1 C) (Oral)   Resp 20   Ht '5\' 5"'$  (1.651 m)   Wt 84.8 kg   LMP 02/20/2000 (Approximate)   SpO2 96%   BMI 31.12 kg/m  Gen:   Awake, no distress   Resp:  Normal effort LCTA MSK:   Moves extremities without difficulty  Other:  RRR, abdomen soft and nontender, no tenderness over bilateral hips, no obvious deformity but pt does have difficulty changing from sitting to standing position   Medical Decision Making  Medically screening exam initiated at 4:59 PM.  Appropriate orders placed.  Alford Highland Antonetti was informed that the remainder of the evaluation will be completed by another provider, this initial triage assessment does not replace that evaluation, and the importance of remaining in the ED until their evaluation is complete.       Suzzette Righter, PA-C 10/12/22 1845

## 2022-10-12 NOTE — ED Provider Notes (Signed)
[facility] Provider Note   CSN: [id number] Arrival date & time: 10/12/22  1407     History  Chief Complaint  Patient presents with   Nausea    Kimberly Barker is a 72 y.o. female.  HPI   Pt started having trouble with vomiting last week in addition to diarrhea about 4 days ago..  The vomiting was more severe.  Pt has been feeling very weak.  She has not been able to eat or drink well for the last several days.   Pt has been having some hip pain issues.  She has been seeing orthopedic doctors.  She has been taking gabapentin.  She has noticed confusion for over a month.  Family is not sure it is related to the medication.  They have not seen anyone for this.  They do feel like its gotten worse.  Today she was seeing people that were not there.  Home Medications Prior to Admission medications   Medication Sig Start Date End Date Taking? Authorizing Provider  calcium carbonate (OSCAL) 1500 (600 Ca) MG TABS tablet Take 600 mg by mouth 2 (two) times daily.     [provider]  cholecalciferol (VITAMIN D3) 25 MCG (1000 UNIT) tablet Take 1,000 Units by mouth in the morning and at bedtime.    [provider]  CYMBALTA 30 MG capsule Take 1 capsule every day by oral route with meal(s) for 30 days. 10/05/22   [provider]  DHA-EPA-Flaxseed Oil-Vitamin E (THERA TEARS NUTRITION PO) Take 3 capsules by mouth daily.    [provider]  diclofenac (VOLTAREN) 75 MG EC tablet Take 75 mg by mouth 2 (two) times daily as needed. 06/18/22   [provider]  diphenhydrAMINE (BENADRYL) 25 MG tablet Take 25 mg by mouth every 6 (six) hours as needed for itching or allergies.    [provider]  empagliflozin (JARDIANCE) 25 MG TABS tablet Take 25 mg by mouth daily. 06/01/17   [provider]  ezetimibe (ZETIA) 10 MG tablet Take 10 mg by mouth daily. 03/19/22   [provider]   gabapentin (NEURONTIN) 100 MG capsule Take 1 capsule every day by oral route in the evening for 30 days. 09/10/22   [provider]  irbesartan (AVAPRO) 150 MG tablet Take 150 mg by mouth daily. 02/25/21   [provider]  levothyroxine (SYNTHROID) 100 MCG tablet Take 100 mcg by mouth daily before breakfast. 09/24/21   [provider]  meloxicam (MOBIC) 15 MG tablet Take 1 tablet by mouth daily.    [provider]  metFORMIN (GLUCOPHAGE-XR) 500 MG 24 hr tablet Take 1,000 mg by mouth 2 (two) times daily. 12/29/16   [provider]  nystatin-triamcinolone ointment (MYCOLOG) Apply 1 application topically 2 (two) times daily. Patient not taking: Reported on 11/06/2021 10/02/20   Yisroel Ramming, Everardo All, MD  predniSONE (DELTASONE) 5 MG tablet TAKE 6 TABLETS DAILY FOR 4 DAYS, 4 TABLETS DAILY FOR 4 DAYS THEN 2 TABLETS DAILY FOR 4 DAYS 07/29/22   [provider]  simvastatin (ZOCOR) 40 MG tablet  08/14/22   [provider]  TRULICITY 3 ER/1.5QM SOPN SMARTSIG:3 Milligram(s) SUB-Q Once a Week 02/28/22   [provider]      Allergies    Atorvastatin, Other, and Rosuvastatin    Review of Systems   Review of Systems  Physical Exam Updated Vital Signs BP (!) 146/77   Pulse 88  Temp 97.6 F (36.4 C) (Oral)   Resp 18   Ht 1.651 m ('5\' 5"'$ )   Wt 84.8 kg   LMP 02/20/2000 (Approximate)   SpO2 98%   BMI 31.12 kg/m  Physical Exam Vitals and nursing note reviewed.  Constitutional:      Appearance: She is well-developed. She is not diaphoretic.  HENT:     Head: Normocephalic and atraumatic.     Right Ear: External ear normal.     Left Ear: External ear normal.  Eyes:     General: No scleral icterus.       Right eye: No discharge.        Left eye: No discharge.     Conjunctiva/sclera: Conjunctivae normal.  Neck:     Trachea: No tracheal deviation.  Cardiovascular:     Rate and Rhythm: Normal rate and regular rhythm.   Pulmonary:     Effort: Pulmonary effort is normal. No respiratory distress.     Breath sounds: Normal breath sounds. No stridor. No wheezing or rales.  Abdominal:     General: Bowel sounds are normal. There is no distension.     Palpations: Abdomen is soft.     Tenderness: There is no abdominal tenderness. There is no guarding or rebound.  Musculoskeletal:        General: No tenderness or deformity.     Cervical back: Neck supple.  Skin:    General: Skin is warm and dry.     Findings: No rash.  Neurological:     General: No focal deficit present.     Mental Status: She is alert.     Cranial Nerves: No cranial nerve deficit, dysarthria or facial asymmetry.     Sensory: No sensory deficit.     Motor: No abnormal muscle tone or seizure activity.     Coordination: Coordination normal.  Psychiatric:        Mood and Affect: Mood normal.     ED Results / Procedures / Treatments   Labs (all labs ordered are listed, but only abnormal results are displayed) Labs Reviewed  COMPREHENSIVE METABOLIC PANEL - Abnormal; Notable for the following components:      Result Value   Sodium 132 (*)    CO2 17 (*)    Glucose, Bld 169 (*)    BUN 42 (*)    Calcium 11.2 (*)    Total Protein 8.2 (*)    Total Bilirubin 1.9 (*)    All other components within normal limits  CBC - Abnormal; Notable for the following components:   WBC 12.5 (*)    RBC 5.48 (*)    Hemoglobin 17.7 (*)    HCT 51.9 (*)    Platelets 412 (*)    All other components within normal limits  URINALYSIS, ROUTINE W REFLEX MICROSCOPIC - Abnormal; Notable for the following components:   APPearance HAZY (*)    Glucose, UA >=500 (*)    Bilirubin Urine LARGE (*)    Ketones, ur 40 (*)    Protein, ur 100 (*)    All other components within normal limits  URINALYSIS, MICROSCOPIC (REFLEX) - Abnormal; Notable for the following components:   Bacteria, UA RARE (*)    All other components within normal limits  RESP PANEL BY RT-PCR (RSV,  FLU A&B, COVID)  RVPGX2  LIPASE, BLOOD    EKG None  Radiology CT ABDOMEN PELVIS W CONTRAST  Result Date: 10/12/2022 CLINICAL DATA:  Mental status change EXAM: CT ABDOMEN AND PELVIS  WITH CONTRAST TECHNIQUE: Multidetector CT imaging of the abdomen and pelvis was performed using the standard protocol following bolus administration of intravenous contrast. RADIATION DOSE REDUCTION: This exam was performed according to the departmental dose-optimization program which includes automated exposure control, adjustment of the mA and/or kV according to patient size and/or use of iterative reconstruction technique. CONTRAST:  121m OMNIPAQUE IOHEXOL 300 MG/ML  SOLN COMPARISON:  Radiograph 10/12/2022, CT 07/08/2016 FINDINGS: Lower chest: Lung bases are clear.  Mitral calcifications. Hepatobiliary: 3 cm hypodense lesion within the right hepatic dome with globular peripheral enhancement most suggestive of hemangioma. Additional hypodensities in the left hepatic lobe without significant change since 2017 and likely due to cysts. No imaging follow-up recommended. No calcified gallstone or biliary dilatation. Pancreas: Unremarkable. No pancreatic ductal dilatation or surrounding inflammatory changes. Spleen: Normal in size without focal abnormality. Adrenals/Urinary Tract: Adrenal glands are within normal limits. Prominent bilateral renal collecting systems without hydroureter. The bladder is unremarkable. Stomach/Bowel: Stomach is within normal limits. Appendix appears normal. No evidence of bowel wall thickening, distention, or inflammatory changes. Vascular/Lymphatic: Mild aortic atherosclerosis. No aneurysm. No suspicious lymph nodes. Reproductive: Status post hysterectomy. No adnexal masses. Other: Negative for pelvic effusion or free air. Stable hypodense lesion measuring 1.9 cm in the right groin, felt consistent with benign finding. Musculoskeletal: No acute osseous abnormality IMPRESSION: 1. No CT evidence for  acute intra-abdominal or pelvic abnormality. 2. 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 3. Aortic atherosclerosis. Aortic Atherosclerosis (ICD10-I70.0). Electronically Signed   By: KDonavan FoilM.D.   On: 10/12/2022 20:29   CT Head Wo Contrast  Result Date: 10/12/2022 CLINICAL DATA:  Altered mental status EXAM: CT HEAD WITHOUT CONTRAST TECHNIQUE: Contiguous axial images were obtained from the base of the skull through the vertex without intravenous contrast. RADIATION DOSE REDUCTION: This exam was performed according to the departmental dose-optimization program which includes automated exposure control, adjustment of the mA and/or kV according to patient size and/or use of iterative reconstruction technique. COMPARISON:  None Available. FINDINGS: Brain: No acute territorial infarction, hemorrhage or intracranial mass. Mild atrophy and chronic small vessel ischemic changes of the white matter. 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 The ventricles are nonenlarged. Vascular: No hyperdense vessels. Vertebral and carotid vascular calcification Skull: Normal. Negative for fracture or focal lesion. Sinuses/Orbits: No acute finding. Other: None IMPRESSION: 1. No CT evidence for acute intracranial abnormality. 2. Atrophy and chronic small vessel ischemic changes of the white matter. 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 Electronically Signed   By: KDonavan FoilM.D.   On: 10/12/2022 20:20   DG Femur Min 2 Views Left  Result Date: 10/12/2022 CLINICAL DATA:  Pain, weakness, left hip pain EXAM: LEFT FEMUR 2 VIEWS COMPARISON:  None Available. FINDINGS: There is no evidence of acute fracture. Alignment is normal. There is mild-to-moderate osteoarthritis of the left hip. There is tricompartment osteoarthritis of the left knee. Vascular calcifications. IMPRESSION: No evidence of acute fracture.  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 Electronically Signed   By: JMaurine SimmeringM.D.   On: 10/12/2022 20:15   DG Pelvis 1-2 Views  Result Date: 10/12/2022 CLINICAL DATA:  Left hip pain for a month EXAM: PELVIS - 1 VIEW COMPARISON:  None Available. FINDINGS: Osteopenia. No fracture or dislocation. Preserved joint spaces. Only mild osteophytes along the sacroiliac joints. Mild degenerative changes along the lower lumbar spine. There is a questionable lucent  lesion involving the proximal left femoral shaft just distal to the lesser trochanter. Recommend dedicated femur x-rays. IMPRESSION: Osteopenia with mild degenerative changes. Questionable lucent lesion versus artifact along the proximal left femoral shaft. Recommend correlation to any dedicated femur x-rays when appropriate if there is no prior. This could represent asymmetric fatty marrow. Electronically Signed   By: Jill Side M.D.   On: 10/12/2022 17:57    Procedures Procedures    Medications Ordered in ED Medications  sodium chloride 0.9 % bolus 1,000 mL (1,000 mLs Intravenous New Bag/Given 10/12/22 1917)    Followed by  0.9 %  sodium chloride infusion (1,000 mLs Intravenous New Bag/Given 10/12/22 2008)  iohexol (OMNIPAQUE) 300 MG/ML solution 100 mL (100 mLs Intravenous Contrast Given 10/12/22 1958)    ED Course/ Medical Decision Making/ A&P Clinical Course as of 10/12/22 2231  Mon Oct 12, 2022  2038 Head CT without acute abnormalities.  Patient does have evidence of multiple small chronic infarcts [JK]  2038 Femur x-ray shows no evidence of fracture.  Patient does have mild to moderate arthritis [JK]  2038 CT abdomen pelvis does not show any acute abnormality [JK]  2038 Patient's labs are notable for elevated BUN and increased hemoglobin hematocrit suggestive of dehydration and hemoconcentration [JK]  2039 Urinalysis does not suggest infection. [JK]  2137 Case discussed with Dr Alcario Drought  regarding admission [JK]    Clinical Course User Index [JK] Dorie Rank, MD                             Medical Decision Making Problems Addressed: Confusion: acute illness or injury that poses a threat to life or bodily functions Dehydration: acute illness or injury that poses a threat to life or bodily functions Uremia: acute illness or injury  Amount and/or Complexity of Data Reviewed Labs: ordered. Decision-making details documented in ED Course.    Details: lAbs reviewed.  Patient has significant elevation in her BUN.  Her hemoglobin is also increased compared to previous suggest strong component of dehydration. Radiology: ordered and independent interpretation performed.    Details: X-rays show mild arthritic changes.  Questionable lucency noted in the femur.  Will repeat with dedicated femur films as recommended.  CT scan does not show acute abnormality of the head or abdomen pelvis.  Femur films without fracture.  Head CT does show old infarcts.   Risk Prescription drug management. Decision regarding hospitalization.   Patient presented to the ED with complaints of confusion as well as nausea vomiting weakness.  Patient's ED workup is notable for uremia, dehydration, hemoconcentration.  Patient was treated with IV fluids.  However family states she also has been having confusion ongoing for least a month.  Patient's head CT does show evidence of chronic infarcts.  Possible she may have some sort of posterior circulation event causing some of this nausea vomiting for her and the confusion.  With her dehydration confusion I think would be reasonable her to be admitted to the hospital for IV hydration as well as MRI which is not available at this facility.  I will consult the medical service for admission        Final Clinical Impression(s) / ED Diagnoses Final diagnoses:  Uremia  Dehydration  Confusion    Rx / DC Orders ED Discharge Orders     None         Dorie Rank, MD 10/12/22 2232

## 2022-10-13 ENCOUNTER — Observation Stay (HOSPITAL_COMMUNITY): Payer: Medicare PPO

## 2022-10-13 DIAGNOSIS — E871 Hypo-osmolality and hyponatremia: Secondary | ICD-10-CM | POA: Diagnosis present

## 2022-10-13 DIAGNOSIS — D751 Secondary polycythemia: Secondary | ICD-10-CM | POA: Diagnosis present

## 2022-10-13 DIAGNOSIS — L89516 Pressure-induced deep tissue damage of right ankle: Secondary | ICD-10-CM | POA: Diagnosis present

## 2022-10-13 DIAGNOSIS — E559 Vitamin D deficiency, unspecified: Secondary | ICD-10-CM | POA: Diagnosis present

## 2022-10-13 DIAGNOSIS — E872 Acidosis, unspecified: Secondary | ICD-10-CM | POA: Diagnosis present

## 2022-10-13 DIAGNOSIS — Z6831 Body mass index (BMI) 31.0-31.9, adult: Secondary | ICD-10-CM | POA: Diagnosis not present

## 2022-10-13 DIAGNOSIS — R9431 Abnormal electrocardiogram [ECG] [EKG]: Secondary | ICD-10-CM | POA: Insufficient documentation

## 2022-10-13 DIAGNOSIS — I10 Essential (primary) hypertension: Secondary | ICD-10-CM | POA: Diagnosis present

## 2022-10-13 DIAGNOSIS — G9340 Encephalopathy, unspecified: Secondary | ICD-10-CM

## 2022-10-13 DIAGNOSIS — F419 Anxiety disorder, unspecified: Secondary | ICD-10-CM | POA: Diagnosis present

## 2022-10-13 DIAGNOSIS — Z1152 Encounter for screening for COVID-19: Secondary | ICD-10-CM | POA: Diagnosis not present

## 2022-10-13 DIAGNOSIS — Z87440 Personal history of urinary (tract) infections: Secondary | ICD-10-CM | POA: Diagnosis not present

## 2022-10-13 DIAGNOSIS — T426X5A Adverse effect of other antiepileptic and sedative-hypnotic drugs, initial encounter: Secondary | ICD-10-CM | POA: Diagnosis present

## 2022-10-13 DIAGNOSIS — E039 Hypothyroidism, unspecified: Secondary | ICD-10-CM | POA: Insufficient documentation

## 2022-10-13 DIAGNOSIS — E669 Obesity, unspecified: Secondary | ICD-10-CM | POA: Diagnosis present

## 2022-10-13 DIAGNOSIS — D75839 Thrombocytosis, unspecified: Secondary | ICD-10-CM | POA: Diagnosis present

## 2022-10-13 DIAGNOSIS — E785 Hyperlipidemia, unspecified: Secondary | ICD-10-CM | POA: Diagnosis present

## 2022-10-13 DIAGNOSIS — R112 Nausea with vomiting, unspecified: Secondary | ICD-10-CM | POA: Insufficient documentation

## 2022-10-13 DIAGNOSIS — K529 Noninfective gastroenteritis and colitis, unspecified: Secondary | ICD-10-CM | POA: Diagnosis present

## 2022-10-13 DIAGNOSIS — L89526 Pressure-induced deep tissue damage of left ankle: Secondary | ICD-10-CM | POA: Diagnosis present

## 2022-10-13 DIAGNOSIS — I160 Hypertensive urgency: Secondary | ICD-10-CM | POA: Diagnosis not present

## 2022-10-13 DIAGNOSIS — M1612 Unilateral primary osteoarthritis, left hip: Secondary | ICD-10-CM | POA: Diagnosis present

## 2022-10-13 DIAGNOSIS — I493 Ventricular premature depolarization: Secondary | ICD-10-CM | POA: Diagnosis present

## 2022-10-13 DIAGNOSIS — E119 Type 2 diabetes mellitus without complications: Secondary | ICD-10-CM | POA: Diagnosis present

## 2022-10-13 DIAGNOSIS — E860 Dehydration: Secondary | ICD-10-CM | POA: Diagnosis present

## 2022-10-13 DIAGNOSIS — G9341 Metabolic encephalopathy: Secondary | ICD-10-CM | POA: Diagnosis present

## 2022-10-13 DIAGNOSIS — Z79899 Other long term (current) drug therapy: Secondary | ICD-10-CM | POA: Diagnosis not present

## 2022-10-13 DIAGNOSIS — G928 Other toxic encephalopathy: Secondary | ICD-10-CM | POA: Diagnosis present

## 2022-10-13 DIAGNOSIS — R197 Diarrhea, unspecified: Secondary | ICD-10-CM | POA: Insufficient documentation

## 2022-10-13 LAB — COMPREHENSIVE METABOLIC PANEL
ALT: 17 U/L (ref 0–44)
AST: 16 U/L (ref 15–41)
Albumin: 3.4 g/dL — ABNORMAL LOW (ref 3.5–5.0)
Alkaline Phosphatase: 55 U/L (ref 38–126)
Anion gap: 13 (ref 5–15)
BUN: 29 mg/dL — ABNORMAL HIGH (ref 8–23)
CO2: 19 mmol/L — ABNORMAL LOW (ref 22–32)
Calcium: 10.3 mg/dL (ref 8.9–10.3)
Chloride: 103 mmol/L (ref 98–111)
Creatinine, Ser: 0.87 mg/dL (ref 0.44–1.00)
GFR, Estimated: >60 mL/min (ref >60)
Glucose, Bld: 113 mg/dL — ABNORMAL HIGH (ref 70–99)
Potassium: 3.5 mmol/L (ref 3.5–5.1)
Sodium: 135 mmol/L (ref 135–145)
Total Bilirubin: 2.1 mg/dL — ABNORMAL HIGH (ref 0.3–1.2)
Total Protein: 6.3 g/dL — ABNORMAL LOW (ref 6.5–8.1)

## 2022-10-13 LAB — AMMONIA: Ammonia: 28 umol/L (ref 9–35)

## 2022-10-13 LAB — HEMOGLOBIN A1C
Hgb A1c MFr Bld: 5.8 % — ABNORMAL HIGH (ref 4.8–5.6)
Mean Plasma Glucose: 119.76 mg/dL

## 2022-10-13 LAB — CBC
HCT: 45.7 % (ref 36.0–46.0)
Hemoglobin: 15.6 g/dL — ABNORMAL HIGH (ref 12.0–15.0)
MCH: 32.7 pg (ref 26.0–34.0)
MCHC: 34.1 g/dL (ref 30.0–36.0)
MCV: 95.8 fL (ref 80.0–100.0)
Platelets: 324 10*3/uL (ref 150–400)
RBC: 4.77 MIL/uL (ref 3.87–5.11)
RDW: 12.1 % (ref 11.5–15.5)
WBC: 9.2 10*3/uL (ref 4.0–10.5)
nRBC: 0 % (ref 0.0–0.2)

## 2022-10-13 LAB — GLUCOSE, CAPILLARY
Glucose-Capillary: 119 mg/dL — ABNORMAL HIGH (ref 70–99)
Glucose-Capillary: 115 mg/dL — ABNORMAL HIGH (ref 70–99)
Glucose-Capillary: 144 mg/dL — ABNORMAL HIGH (ref 70–99)
Glucose-Capillary: 137 mg/dL — ABNORMAL HIGH (ref 70–99)

## 2022-10-13 LAB — TROPONIN I (HIGH SENSITIVITY): Troponin I (High Sensitivity): 17 ng/L (ref <18)

## 2022-10-13 LAB — TSH: TSH: 0.447 u[IU]/mL (ref 0.350–4.500)

## 2022-10-13 LAB — MAGNESIUM: Magnesium: 2 mg/dL (ref 1.7–2.4)

## 2022-10-13 LAB — VITAMIN B12: Vitamin B-12: 1880 pg/mL — ABNORMAL HIGH (ref 180–914)

## 2022-10-13 MED ORDER — INSULIN ASPART 100 UNIT/ML IJ SOLN: 0.0000 [IU] | Freq: Every day | INTRAMUSCULAR | Status: DC

## 2022-10-13 MED ORDER — SODIUM CHLORIDE 0.9 % IV SOLN
1000.0000 mL | INTRAVENOUS | Status: DC
  Administered 2022-10-13: 1000 mL via INTRAVENOUS

## 2022-10-13 MED ORDER — ACETAMINOPHEN 325 MG PO TABS: 650.0000 mg | ORAL_TABLET | Freq: Four times a day (QID) | ORAL | Status: DC | PRN

## 2022-10-13 MED ORDER — INSULIN ASPART 100 UNIT/ML IJ SOLN
0.0000 [IU] | Freq: Three times a day (TID) | INTRAMUSCULAR | Status: DC
  Administered 2022-10-13 – 2022-10-14 (×2): 1 [IU] via SUBCUTANEOUS

## 2022-10-13 MED ORDER — POTASSIUM CHLORIDE CRYS ER 20 MEQ PO TBCR
40.0000 meq | EXTENDED_RELEASE_TABLET | Freq: Once | ORAL | Status: DC
  Filled 2022-10-13: qty 2

## 2022-10-13 MED ORDER — ACETAMINOPHEN 650 MG RE SUPP: 650.0000 mg | Freq: Four times a day (QID) | RECTAL | Status: DC | PRN

## 2022-10-13 MED ORDER — ZINC OXIDE 40 % EX OINT
TOPICAL_OINTMENT | Freq: Two times a day (BID) | CUTANEOUS | Status: DC
  Filled 2022-10-13: qty 57

## 2022-10-13 MED ORDER — ONDANSETRON HCL 4 MG/2ML IJ SOLN: 4.0000 mg | Freq: Once | INTRAMUSCULAR | Status: DC | PRN

## 2022-10-13 MED ORDER — ONDANSETRON HCL 4 MG/2ML IJ SOLN: 4.0000 mg | Freq: Four times a day (QID) | INTRAMUSCULAR | Status: DC | PRN

## 2022-10-13 NOTE — Progress Notes (Signed)
Carotid artery duplex has been completed. Preliminary results can be found in CV Proc through chart review.   10/13/22 4:14 PM Kimberly Barker RVT

## 2022-10-13 NOTE — Progress Notes (Signed)
OT Cancellation Note  Patient Details Name: Kimberly Barker MRN: [id number] DOB: [birth year]   Cancelled Treatment:    Reason Eval/Treat Not Completed: Other (comment). Pt is currently working with PT.  [place], OTR/L [facility] [phone] Office [phone]    Almon Register 10/13/2022, 12:51 PM

## 2022-10-13 NOTE — Progress Notes (Signed)
[facility] PROGRESS NOTE   Kimberly Barker ZOX:[id number] DOB: 06/15/1951 DOA: 10/12/2022  PCP: Algis Greenhouse, MD  Brief History/Interval Summary: 72 y.o. female with medical history significant of type 2 diabetes, hypertension, hyperlipidemia, hypothyroidism, GERD, anxiety/panic attacks, iron deficiency anemia, B12 deficiency, vitamin D deficiency presented to the ED with nausea, vomiting, and diarrhea ongoing for about 4 days but none in the last 1 day.  Main concern was patient is on and off confusion that has been going on for about 6 weeks.  Patient was hospitalized for further management. Patient states she was recently diagnosed with a UTI and treated with an antibiotic which she finished taking a few days ago.    Consultants: None  Procedures: None    Subjective/Interval History: Patient has some hip pain which appears to be chronic.  No other discomfort currently.  Denies any chest pain shortness of breath.  Her sister is at the bedside.    Assessment/Plan:  Acute metabolic encephalopathy Patient does not have any focal neurological deficits on examination.  UA does not suggest infection.  No other infectious etiology identified. Brain MRI is pending. Metabolic workup initiated.  TSH normal at 0.447.  B12 is pending.  Ammonia was normal. It appears that patient has been placed on multiple different medications for her hip pain recently.  The last change was initiation of gabapentin.  She has not taken this medication in the last few days.  Etiology for her mental status changes most likely medications.  Continue to hold gabapentin.  Nausea vomiting and diarrhea Could have had viral gastroenteritis.  Has not had any diarrhea in about 24 hours.  Stool studies have been ordered.  Can be canceled if she does not have any diarrhea in the next 24 hours. Abdomen is benign.  CT of the abdomen pelvis did not show any acute findings.  Possible hemangioma was noted in  the liver.  Ultrasound has been ordered.  Normal anion gap metabolic acidosis Likely due to GI loss.  Improved this morning.  Mild hypercalcemia Likely due to dehydration.  Improved this morning.  Abnormal EKG Nonspecific ST changes and T wave inversions noted.  Patient denies any chest pain.  Troponin is normal.  Telemetry shows PVCs.  No echocardiogram results in our system or in [place].  Will order echo.  Check magnesium.  Supplement potassium.  Mild hyponatremia Resolved.  Continue IV fluids for another 24 hours  Diabetes mellitus type 2, controlled HbA1c 5.8.  Continue with SSI.  Essential hypertension Monitor blood pressures closely.  Hypothyroidism Resume levothyroxine once medication reconciliation has been completed  Chronic left hip pain Imaging studies does not show any obvious injuries.  Osteoarthritis noted.   Obesity Estimated body mass index is 31.12 kg/m as calculated from the following:   Height as of this encounter: '5\' 5"'$  (1.651 m).   Weight as of this encounter: 84.8 kg.  DVT Prophylaxis: SCDs Code Status: Full code Family Communication: Discussed with patient and her sister Disposition Plan: PT and OT evaluation  Status is: Observation The patient will require care spanning > 2 midnights and should be moved to inpatient because: Acute metabolic encephalopathy of unclear etiology      Medications: Scheduled:  insulin aspart  0-5 Units Subcutaneous QHS   insulin aspart  0-9 Units Subcutaneous TID WC   liver oil-zinc oxide   Topical BID   Continuous:  sodium chloride     WJX:BJYNWGNFAOZHY **OR** acetaminophen, ondansetron (ZOFRAN) IV  Antibiotics: Anti-infectives (From  admission, onward)    None       Objective:  Vital Signs  Vitals:   10/13/22 0130 10/13/22 0200 10/13/22 0411 10/13/22 0852  BP: (!) 177/79 (!) 177/77 (!) 149/65 (!) 174/77  Pulse: 77 74  74  Resp: 16 16 (!) 24 20  Temp: 97.8 F (36.6 C)  98.5 F (36.9  C) 97.6 F (36.4 C)  TempSrc: Oral  Oral Oral  SpO2: 94% 93% 94% 95%  Weight:      Height:        Intake/Output Summary (Last 24 hours) at 10/13/2022 0943 Last data filed at 10/13/2022 0246 Gross per 24 hour  Intake 1794.54 ml  Output --  Net 1794.54 ml   Filed Weights   10/12/22 1412  Weight: 84.8 kg    General appearance: Awake alert.  In no distress.  Mildly distracted Resp: Clear to auscultation bilaterally.  Normal effort Cardio: S1-S2 is normal regular.  No S3-S4.  No rubs murmurs or bruit GI: Abdomen is soft.  Nontender nondistended.  Bowel sounds are present normal.  No masses organomegaly Extremities: No edema.  Full range of motion of lower extremities. Neurologic: Oriented to place city year person.  No focal neurological deficits.    Lab Results:  Data Reviewed: I have personally reviewed following labs and reports of the imaging studies  CBC: Recent Labs  Lab 10/12/22 1419 10/13/22 0747  WBC 12.5* 9.2  HGB 17.7* 15.6*  HCT 51.9* 45.7  MCV 94.7 95.8  PLT 412* 500    Basic Metabolic Panel: Recent Labs  Lab 10/12/22 1419 10/13/22 0747  NA 132* 135  K 3.7 3.5  CL 100 103  CO2 17* 19*  GLUCOSE 169* 113*  BUN 42* 29*  CREATININE 0.94 0.87  CALCIUM 11.2* 10.3    GFR: Estimated Creatinine Clearance: 63.8 mL/min (by C-G formula based on SCr of 0.87 mg/dL).  Liver Function Tests: Recent Labs  Lab 10/12/22 1419 10/13/22 0747  AST 16 16  ALT 20 17  ALKPHOS 67 55  BILITOT 1.9* 2.1*  PROT 8.2* 6.3*  ALBUMIN 4.3 3.4*    Recent Labs  Lab 10/12/22 1419  LIPASE 35   Recent Labs  Lab 10/13/22 0747  AMMONIA 28    HbA1C: Recent Labs    10/13/22 0747  HGBA1C 5.8*    CBG: Recent Labs  Lab 10/13/22 0857  GLUCAP 119*     Thyroid Function Tests: Recent Labs    10/13/22 0747  TSH 0.447      Recent Results (from the past 240 hour(s))  Resp panel by RT-PCR (RSV, Flu A&B, Covid) Nasopharyngeal Swab     Status: None    Collection Time: 10/12/22  2:10 PM   Specimen: Nasopharyngeal Swab; Nasal Swab  Result Value Ref Range Status   SARS Coronavirus 2 by RT PCR NEGATIVE NEGATIVE Final    Comment: (NOTE) SARS-CoV-2 target nucleic acids are NOT DETECTED.  The SARS-CoV-2 RNA is generally detectable in upper respiratory specimens during the acute phase of infection. The lowest concentration of SARS-CoV-2 viral copies this assay can detect is 138 copies/mL. A negative result does not preclude SARS-Cov-2 infection and should not be used as the sole basis for treatment or other patient management decisions. A negative result may occur with  improper specimen collection/handling, submission of specimen other than nasopharyngeal swab, presence of viral mutation(s) within the areas targeted by this assay, and inadequate number of viral copies(<138 copies/mL). A negative result must be combined with clinical  observations, patient history, and epidemiological information. The expected result is Negative.  Fact Sheet for Patients:  EntrepreneurPulse.com.au  Fact Sheet for Healthcare Providers:  IncredibleEmployment.be  This test is no t yet approved or cleared by the [place] FDA and  has been authorized for detection and/or diagnosis of SARS-CoV-2 by FDA under an Emergency Use Authorization (EUA). This EUA will remain  in effect (meaning this test can be used) for the duration of the COVID-19 declaration under Section 564(b)(1) of the Act, 21 U.S.C.section 360bbb-3(b)(1), unless the authorization is terminated  or revoked sooner.       Influenza A by PCR NEGATIVE NEGATIVE Final   Influenza B by PCR NEGATIVE NEGATIVE Final    Comment: (NOTE) The Xpert Xpress SARS-CoV-2/FLU/RSV plus assay is intended as an aid in the diagnosis of influenza from Nasopharyngeal swab specimens and should not be used as a sole basis for treatment. Nasal washings and aspirates are  unacceptable for Xpert Xpress SARS-CoV-2/FLU/RSV testing.  Fact Sheet for Patients: EntrepreneurPulse.com.au  Fact Sheet for Healthcare Providers: IncredibleEmployment.be  This test is not yet approved or cleared by the [place] FDA and has been authorized for detection and/or diagnosis of SARS-CoV-2 by FDA under an Emergency Use Authorization (EUA). This EUA will remain in effect (meaning this test can be used) for the duration of the COVID-19 declaration under Section 564(b)(1) of the Act, 21 U.S.C. section 360bbb-3(b)(1), unless the authorization is terminated or revoked.     Resp Syncytial Virus by PCR NEGATIVE NEGATIVE Final    Comment: (NOTE) Fact Sheet for Patients: EntrepreneurPulse.com.au  Fact Sheet for Healthcare Providers: IncredibleEmployment.be  This test is not yet approved or cleared by the [place] FDA and has been authorized for detection and/or diagnosis of SARS-CoV-2 by FDA under an Emergency Use Authorization (EUA). This EUA will remain in effect (meaning this test can be used) for the duration of the COVID-19 declaration under Section 564(b)(1) of the Act, 21 U.S.C. section 360bbb-3(b)(1), unless the authorization is terminated or revoked.  Performed at [facility], [address] [place], [place] [address]       Radiology Studies: CT ABDOMEN PELVIS W CONTRAST  Result Date: 10/12/2022 CLINICAL DATA:  Mental status change EXAM: CT ABDOMEN AND PELVIS WITH CONTRAST TECHNIQUE: Multidetector CT imaging of the abdomen and pelvis was performed using the standard protocol following bolus administration of intravenous contrast. RADIATION DOSE REDUCTION: This exam was performed according to the departmental dose-optimization program which includes automated exposure control, adjustment of the mA and/or kV according to patient size and/or use of iterative reconstruction  technique. CONTRAST:  166m OMNIPAQUE IOHEXOL 300 MG/ML  SOLN COMPARISON:  Radiograph 10/12/2022, CT 07/08/2016 FINDINGS: Lower chest: Lung bases are clear.  Mitral calcifications. Hepatobiliary: 3 cm hypodense lesion within the right hepatic dome with globular peripheral enhancement most suggestive of hemangioma. Additional hypodensities in the left hepatic lobe without significant change since 2017 and likely due to cysts. No imaging follow-up recommended. No calcified gallstone or biliary dilatation. Pancreas: Unremarkable. No pancreatic ductal dilatation or surrounding inflammatory changes. Spleen: Normal in size without focal abnormality. Adrenals/Urinary Tract: Adrenal glands are within normal limits. Prominent bilateral renal collecting systems without hydroureter. The bladder is unremarkable. Stomach/Bowel: Stomach is within normal limits. Appendix appears normal. No evidence of bowel wall thickening, distention, or inflammatory changes. Vascular/Lymphatic: Mild aortic atherosclerosis. No aneurysm. No suspicious lymph nodes. Reproductive: Status post hysterectomy. No adnexal masses. Other: Negative for pelvic effusion or free air. Stable hypodense lesion  measuring 1.9 cm in the right groin, felt consistent with benign finding. Musculoskeletal: No acute osseous abnormality IMPRESSION: 1. No CT evidence for acute intra-abdominal or pelvic abnormality. 2. 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 3. Aortic atherosclerosis. Aortic Atherosclerosis (ICD10-I70.0). Electronically Signed   By: Donavan Foil M.D.   On: 10/12/2022 20:29   CT Head Wo Contrast  Result Date: 10/12/2022 CLINICAL DATA:  Altered mental status EXAM: CT HEAD WITHOUT CONTRAST TECHNIQUE: Contiguous axial images were obtained from the base of the skull through the vertex without intravenous contrast. RADIATION DOSE REDUCTION: This exam was performed according to the departmental dose-optimization program which includes automated exposure  control, adjustment of the mA and/or kV according to patient size and/or use of iterative reconstruction technique. COMPARISON:  None Available. FINDINGS: Brain: No acute territorial infarction, hemorrhage or intracranial mass. Mild atrophy and chronic small vessel ischemic changes of the white matter. 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 The ventricles are nonenlarged. Vascular: No hyperdense vessels. Vertebral and carotid vascular calcification Skull: Normal. Negative for fracture or focal lesion. Sinuses/Orbits: No acute finding. Other: None IMPRESSION: 1. No CT evidence for acute intracranial abnormality. 2. Atrophy and chronic small vessel ischemic changes of the white matter. 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 Electronically Signed   By: Donavan Foil M.D.   On: 10/12/2022 20:20   DG Femur Min 2 Views Left  Result Date: 10/12/2022 CLINICAL DATA:  Pain, weakness, left hip pain EXAM: LEFT FEMUR 2 VIEWS COMPARISON:  None Available. FINDINGS: There is no evidence of acute fracture. Alignment is normal. There is mild-to-moderate osteoarthritis of the left hip. There is tricompartment osteoarthritis of the left knee. Vascular calcifications. IMPRESSION: No evidence of acute fracture. 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 Electronically Signed   By: Maurine Simmering M.D.   On: 10/12/2022 20:15   DG Pelvis 1-2 Views  Result Date: 10/12/2022 CLINICAL DATA:  Left hip pain for a month EXAM: PELVIS - 1 VIEW COMPARISON:  None Available. FINDINGS: Osteopenia. No fracture or dislocation. Preserved joint spaces. Only mild osteophytes along the sacroiliac joints. Mild degenerative changes along the lower lumbar spine. There is a questionable lucent lesion involving the proximal left femoral shaft just distal to the lesser trochanter. Recommend dedicated femur  x-rays. IMPRESSION: Osteopenia with mild degenerative changes. Questionable lucent lesion versus artifact along the proximal left femoral shaft. Recommend correlation to any dedicated femur x-rays when appropriate if there is no prior. This could represent asymmetric fatty marrow. Electronically Signed   By: Jill Side M.D.   On: 10/12/2022 17:57       LOS: 0 days   [facility] Pager on [email or web address]  10/13/2022, 9:43 AM

## 2022-10-13 NOTE — ED Notes (Addendum)
Called [facility] for transport spoke with Tammy at 12:03.

## 2022-10-13 NOTE — TOC Initial Note (Signed)
Transition of Care [facility]) - Initial/Assessment Note    Patient Details  Name: Kimberly Barker MRN: [id number] Date of Birth: Sep 12, 1951  Transition of [facility]) CM/SW Contact:    Pollie Friar, RN Phone Number: 10/13/2022, 2:18 PM  Clinical Narrative:                 CM met with the patient and her spouse at the bedside. Spouse is with patient most of time but works part-time 3-4 days per week.  Spouse manages her medications and they deny any issues. Spouse provides all needed transportation. Home health recommended and pt asked to use [facility]. Information on the AVS. Cory with [facility] accepted the referral. TOC following.  Expected Discharge Plan: [place] Barriers to Discharge: Continued Medical Work up   Patient Goals and CMS Choice   CMS Medicare.gov Compare Post Acute Care list provided to:: Patient Choice offered to / list presented to : Patient, Spouse      Expected Discharge Plan and Services   Discharge Planning Services: CM Consult Post Acute Care Choice: [place] arrangements for the past 2 months: Single Family Home                           HH Arranged: PT, OT HH Agency: [facility] Date [place]: 10/13/22   Representative spoke with at [place]: Tommi Rumps  Prior Living Arrangements/Services Living arrangements for the past 2 months: [place] Lives with:: Spouse Patient language and need for interpreter reviewed:: Yes Do you feel safe going back to the place where you live?: Yes          Current home services: DME (walker/ rollator/ shower seat) Criminal Activity/Legal Involvement Pertinent to Current Situation/Hospitalization: No - Comment as needed  Activities of Daily Living Home Assistive Devices/Equipment: Environmental consultant (specify type) ADL Screening (condition at time of admission) Patient's cognitive ability adequate to safely complete daily activities?: Yes Is the patient deaf or  have difficulty hearing?: No Does the patient have difficulty seeing, even when wearing glasses/contacts?: No Does the patient have difficulty concentrating, remembering, or making decisions?: Yes Patient able to express need for assistance with ADLs?: Yes Does the patient have difficulty dressing or bathing?: Yes Independently performs ADLs?: No Communication: Independent Dressing (OT): Needs assistance Is this a change from baseline?: Pre-admission baseline Grooming: Needs assistance Is this a change from baseline?: Pre-admission baseline Feeding: Independent Bathing: Needs assistance Is this a change from baseline?: Pre-admission baseline Toileting: Needs assistance Is this a change from baseline?: Pre-admission baseline In/Out Bed: Needs assistance Is this a change from baseline?: Pre-admission baseline Walks in Home: Needs assistance Is this a change from baseline?: Pre-admission baseline Does the patient have difficulty walking or climbing stairs?: Yes Weakness of Legs: Both Weakness of Arms/Hands: Both  Permission Sought/Granted                  Emotional Assessment Appearance:: Appears stated age Attitude/Demeanor/Rapport: Engaged Affect (typically observed): Accepting Orientation: : Oriented to Self, Oriented to Place   Psych Involvement: No (comment)  Admission diagnosis:  Dehydration [E86.0] Confusion [R41.0] Uremia [N19] Acute encephalopathy [G93.40] Patient Active Problem List   Diagnosis Date Noted   Nausea and vomiting 10/13/2022   Diarrhea 10/13/2022   Normal anion gap metabolic acidosis 88/32/5498   Hypercalcemia 10/13/2022   Hyponatremia 10/13/2022   Erythrocytosis 10/13/2022   Thrombocytosis 10/13/2022   Abnormal EKG 10/13/2022  Essential hypertension 10/13/2022   Hypothyroidism 10/13/2022   Acute encephalopathy 10/12/2022   B12 deficiency anemia 03/09/2022    Class: Diagnosis of   Deficiency anemia 11/04/2021    Class: Chronic   Iron  deficiency anemia 09/03/2021    Class: Chronic   Non-insulin dependent type 2 diabetes mellitus ([place]) 05/08/2009   DYSLIPIDEMIA 05/07/2009   OBSTRUCTIVE SLEEP APNEA 05/07/2009   ALLERGIC RHINITIS 05/07/2009   PCP:  Algis Greenhouse, MD Pharmacy:   [facility], [address] [place] [address] Phone: [phone] Fax: [place], [address] [place] [address] Phone: [phone] Fax: [phone]     Social Determinants of Health (SDOH) Social History: SDOH Screenings   Food Insecurity: No Food Insecurity (10/13/2022)  Housing: Low Risk  (10/13/2022)  Transportation Needs: No Transportation Needs (10/13/2022)  Utilities: Not At Risk (10/13/2022)  Tobacco Use: Low Risk  (10/12/2022)   SDOH Interventions:     Readmission Risk Interventions     No data to display
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
## 2022-10-13 NOTE — Evaluation (Signed)
Physical Therapy Evaluation Patient Details Name: Kimberly Barker MRN: [id number] DOB: 1950/10/22 Today's Date: 10/13/2022  History of Present Illness  Kimberly Barker is a 72 y.o. female presents to the ED with nausea, vomiting, and diarrhea. Family reported increasing confusion over a month, now having hallucinations. Acute metabolic encephalopathy. 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 PHMx:type 2 diabetes, hypertension, hyperlipidemia, hypothyroidism, GERD, anxiety/panic attacks, iron deficiency anemia, B12 deficiency, vitamin D deficiency  Clinical Impression  Pt admitted with above diagnosis. Requires min assist for sit<>stand transfers and gait due to balance and sequencing difficulties. Lacks some insight into her deficits, requiring cues for safety and awareness. Suspect functional mobility will improve as she improves medically. Will update recs as appropriate. Pt currently with functional limitations due to the deficits listed below (see PT Problem List). Pt will benefit from skilled PT to increase their independence and safety with mobility to allow discharge to the venue listed below.          Recommendations for follow up therapy are one component of a multi-disciplinary discharge planning process, led by the attending physician.  Recommendations may be updated based on patient status, additional functional criteria and insurance authorization.  Follow Up Recommendations Home health PT      Assistance Recommended at Discharge Frequent or constant Supervision/Assistance  Patient can return home with the following  A little help with walking and/or transfers;A little help with bathing/dressing/bathroom;Assistance with cooking/housework;Direct supervision/assist for medications management;Direct supervision/assist for financial management;Assist for  transportation    Equipment Recommendations  (TBD)  Recommendations for Other Services       Functional Status Assessment Patient has had a recent decline in their functional status and demonstrates the ability to make significant improvements in function in a reasonable and predictable amount of time.     Precautions / Restrictions Precautions Precautions: Fall Restrictions Weight Bearing Restrictions: No      Mobility  Bed Mobility Overal bed mobility: Modified Independent             General bed mobility comments: extra time    Transfers Overall transfer level: Needs assistance Equipment used: Rolling walker (2 wheels) Transfers: Sit to/from Stand Sit to Stand: Min assist           General transfer comment: Min assist for boost and balance. Cues for hand placement and to place and keep hands on RW after standing up (reaching to fix her hair causing LOB.) Cues for safety and awareness.    Ambulation/Gait Ambulation/Gait assistance: Min assist Gait Distance (Feet): 40 Feet Assistive device: Rolling walker (2 wheels) Gait Pattern/deviations: Step-through pattern, Decreased stride length, Drifts right/left Gait velocity: decr Gait velocity interpretation: <1.31 ft/sec, indicative of household ambulator   General Gait Details: Educated on safe AD use with RW for support. Drifting in room slightly. Reactive once wheel strikes furniture but not anticipatory. Cues for awareness. Min assist intially for balance, progressed to where she needed min assist intermittently for RW control. Gait slow but no overt buckling.  Stairs            Wheelchair Mobility    Modified Rankin (Stroke Patients Only)       Balance Overall balance assessment: Needs assistance Sitting-balance support: No upper extremity supported, Feet supported Sitting balance-Leahy Scale: Good     Standing balance support: Bilateral upper extremity supported, Reliant on assistive device  for balance Standing balance-Leahy  Scale: Poor                               Pertinent Vitals/Pain Pain Assessment Pain Assessment: Faces Faces Pain Scale: Hurts little more (chronic) Pain Location: left leg and hip Pain Descriptors / Indicators: Aching Pain Intervention(s): Monitored during session, Repositioned    Home Living Family/patient expects to be discharged to:: Private residence Living Arrangements: Spouse/significant other Available Help at Discharge: Family;Available PRN/intermittently (husband works part time) Type of Home: House Home Access: Level entry       [place]: [place]: Air cabin crew (4 wheels);Cane - single point      Prior Function Prior Level of Function : Independent/Modified Independent             Mobility Comments: ind used rollator for mobility.       Hand Dominance   Dominant Hand: Right    Extremity/Trunk Assessment   Upper Extremity Assessment Upper Extremity Assessment: Defer to OT evaluation    Lower Extremity Assessment Lower Extremity Assessment: LLE deficits/detail LLE Deficits / Details: reports weakness at baseline - Lt ankle DF 4+/5, hip flexion 4/5 LLE Sensation: decreased light touch (reports intermittent at baseline)       Communication   Communication: No difficulties  Cognition Arousal/Alertness: Awake/alert Behavior During Therapy: WFL for tasks assessed/performed Overall Cognitive Status: Impaired/Different from baseline Area of Impairment: Orientation, Problem solving, Safety/judgement                 Orientation Level: Disoriented to, Time       Safety/Judgement: Decreased awareness of safety, Decreased awareness of deficits   Problem Solving: Slow processing, Decreased initiation, Requires verbal cues, Difficulty sequencing General Comments: Oriented except states wrong month (one month off, but apprently is consistently getting this question wrong.)         General Comments General comments (skin integrity, edema, etc.): Family present and supportive.    Exercises     Assessment/Plan    PT Assessment Patient needs continued PT services  PT Problem List Decreased strength;Decreased activity tolerance;Decreased balance;Decreased mobility;Decreased coordination;Decreased cognition;Decreased knowledge of use of DME;Decreased safety awareness;Decreased knowledge of precautions;Impaired sensation;Obesity;Pain       PT Treatment Interventions DME instruction;Gait training;Functional mobility training;Therapeutic activities;Therapeutic exercise;Balance training;Neuromuscular re-education;Patient/family education;Cognitive remediation    PT Goals (Current goals can be found in the Care Plan section)  Acute Rehab PT Goals Patient Stated Goal: Get well PT Goal Formulation: With patient/family Time For Goal Achievement: 10/27/22 Potential to Achieve Goals: Good    Frequency Min 3X/week     Co-evaluation               AM-PAC PT "6 Clicks" Mobility  Outcome Measure Help needed turning from your back to your side while in a flat bed without using bedrails?: None Help needed moving from lying on your back to sitting on the side of a flat bed without using bedrails?: A Little Help needed moving to and from a bed to a chair (including a wheelchair)?: A Little Help needed standing up from a chair using your arms (e.g., wheelchair or bedside chair)?: A Little Help needed to walk in hospital room?: A Little Help needed climbing 3-5 steps with a railing? : A Little 6 Click Score: 19    End of Session Equipment Utilized During Treatment: Gait belt Activity Tolerance: Patient tolerated treatment well Patient left: in chair;with call bell/phone within reach;with chair alarm set;with  family/visitor present;with nursing/sitter in room;with SCD's reapplied Nurse Communication: Mobility status PT Visit Diagnosis: Unsteadiness on feet  (R26.81);Other abnormalities of gait and mobility (R26.89);Muscle weakness (generalized) (M62.81);Difficulty in walking, not elsewhere classified (R26.2);Pain Pain - Right/Left: Left Pain - part of body: Hip    Time: 1235-1306 PT Time Calculation (min) (ACUTE ONLY): 31 min   Charges:   PT Evaluation $PT Eval Low Complexity: 1 Low PT Treatments $Gait Training: 8-22 mins        Candie Mile, PT, DPT Physical Therapist Acute Rehabilitation Services [place]   Ellouise Newer 10/13/2022, 1:29 PM

## 2022-10-13 NOTE — Consult Note (Addendum)
[facility] Nurse Consult Note: Reason for Consult:bilateral malleolus pressure injuries R>L Wound type:Pressure Pressure Injury POA: Yes Measurement:Right: 1cm round area of erythema with 0.7cm round center ulcer with yellow slough obscuring wound bed. No drainage. Left: 1cm round area of purple/maroon discoloration consistent with DTPI Wound bed:As described above Drainage (amount, consistency, odor) none Periwound: dry, intact Dressing procedure/placement/frequency: I have provided Nursing with guidance for topical care to be performed daily beginning today with a NS cleanse, pat dry and following with placement of a xeroform gauze topped with dry gauze and secured with silicone foam dressings. The feet are to be placed into Prevalon boots for alignment/correction of lateral rotation. A sacral prophylactic foam dressing is to be placed to the sacrum for PI prevention and turning and repositioning per house protocol is in place. POC for episodes of incontinence is provided using house skin cleanser and Desitin ointment. DermaTherapy bed linen system with a low friction coefficient (CoF) is in place.  Discussed POC with patient and family member in room.   [facility] nursing team will not follow, but will remain available to this patient, the nursing and medical teams.  Please re-consult if needed.  Thank you for inviting [place] to participate in this patient's Plan of Care.  Maudie Flakes, MSN, RN, CNS, [place], Serita Grammes, [employer], [employer] phone:  [phone]

## 2022-10-14 ENCOUNTER — Inpatient Hospital Stay (HOSPITAL_COMMUNITY): Payer: Medicare PPO

## 2022-10-14 DIAGNOSIS — G9340 Encephalopathy, unspecified: Secondary | ICD-10-CM | POA: Diagnosis not present

## 2022-10-14 DIAGNOSIS — R9431 Abnormal electrocardiogram [ECG] [EKG]: Secondary | ICD-10-CM | POA: Diagnosis not present

## 2022-10-14 LAB — COMPREHENSIVE METABOLIC PANEL
ALT: 18 U/L (ref 0–44)
AST: 20 U/L (ref 15–41)
Albumin: 3.2 g/dL — ABNORMAL LOW (ref 3.5–5.0)
Alkaline Phosphatase: 49 U/L (ref 38–126)
Anion gap: 12 (ref 5–15)
BUN: 20 mg/dL (ref 8–23)
CO2: 22 mmol/L (ref 22–32)
Calcium: 9.9 mg/dL (ref 8.9–10.3)
Chloride: 104 mmol/L (ref 98–111)
Creatinine, Ser: 0.77 mg/dL (ref 0.44–1.00)
GFR, Estimated: >60 mL/min (ref >60)
Glucose, Bld: 114 mg/dL — ABNORMAL HIGH (ref 70–99)
Potassium: 3.4 mmol/L — ABNORMAL LOW (ref 3.5–5.1)
Sodium: 138 mmol/L (ref 135–145)
Total Bilirubin: 2.2 mg/dL — ABNORMAL HIGH (ref 0.3–1.2)
Total Protein: 5.8 g/dL — ABNORMAL LOW (ref 6.5–8.1)

## 2022-10-14 LAB — ECHOCARDIOGRAM COMPLETE
AR max vel: 1.84 cm2
AV Area VTI: 2.01 cm2
AV Area mean vel: 1.81 cm2
AV Mean grad: 13.5 mmHg
AV Peak grad: 26.5 mmHg
Ao pk vel: 2.58 m/s
Area-P 1/2: 2.99 cm2
Height: 65 in
S' Lateral: 3.3 cm
Weight: 2992 oz

## 2022-10-14 LAB — CBC
HCT: 45.8 % (ref 36.0–46.0)
Hemoglobin: 15.2 g/dL — ABNORMAL HIGH (ref 12.0–15.0)
MCH: 32.3 pg (ref 26.0–34.0)
MCHC: 33.2 g/dL (ref 30.0–36.0)
MCV: 97.4 fL (ref 80.0–100.0)
Platelets: 295 10*3/uL (ref 150–400)
RBC: 4.7 MIL/uL (ref 3.87–5.11)
RDW: 12.1 % (ref 11.5–15.5)
WBC: 5.9 10*3/uL (ref 4.0–10.5)
nRBC: 0 % (ref 0.0–0.2)

## 2022-10-14 LAB — MAGNESIUM: Magnesium: 1.8 mg/dL (ref 1.7–2.4)

## 2022-10-14 LAB — MISC LABCORP TEST (SEND OUT): Labcorp test code: 81950

## 2022-10-14 LAB — GLUCOSE, CAPILLARY
Glucose-Capillary: 107 mg/dL — ABNORMAL HIGH (ref 70–99)
Glucose-Capillary: 117 mg/dL — ABNORMAL HIGH (ref 70–99)
Glucose-Capillary: 130 mg/dL — ABNORMAL HIGH (ref 70–99)

## 2022-10-14 MED ORDER — ASPIRIN 81 MG PO TBEC: 81.0000 mg | DELAYED_RELEASE_TABLET | Freq: Every day | ORAL | 11 refills | Status: AC

## 2022-10-14 MED ORDER — POTASSIUM CHLORIDE CRYS ER 20 MEQ PO TBCR
40.0000 meq | EXTENDED_RELEASE_TABLET | Freq: Once | ORAL | Status: AC
  Administered 2022-10-14: 40 meq via ORAL
  Filled 2022-10-14: qty 2

## 2022-10-14 MED ORDER — METOPROLOL TARTRATE 25 MG PO TABS: 12.5000 mg | ORAL_TABLET | Freq: Two times a day (BID) | ORAL | 2 refills | Status: DC

## 2022-10-14 MED ORDER — METOPROLOL TARTRATE 12.5 MG HALF TABLET
12.5000 mg | ORAL_TABLET | Freq: Two times a day (BID) | ORAL | Status: DC
  Administered 2022-10-14: 12.5 mg via ORAL
  Filled 2022-10-14: qty 1

## 2022-10-14 NOTE — Evaluation (Signed)
Occupational Therapy Evaluation Patient Details Name: Kimberly Barker MRN: [id number] DOB: [birth year] Today's Date: 10/14/2022   History of Present Illness Kimberly Barker is a 72 y.o. female presents to the ED with nausea, vomiting, and diarrhea. Family reported increasing confusion over a month, now having hallucinations. Acute metabolic encephalopathy. 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 PHMx:type 2 diabetes, hypertension, hyperlipidemia, hypothyroidism, GERD, anxiety/panic attacks, iron deficiency anemia, B12 deficiency, vitamin D deficiency   Clinical Impression   PTA, pt lived with her husband and was mod I. Upon eval, pt presents with decreased strength, balance, problem solving, and short term memory. Pt performing UB ADL with set-up abd LB ADL with min guard A and min cues for safety during STS. Pt performing functional mobility to sink using RW with min guard A and min cues for safety. Recommending discharge home with HHOT to optimize safety and independence in ADL and IADL.      Recommendations for follow up therapy are one component of a multi-disciplinary discharge planning process, led by the attending physician.  Recommendations may be updated based on patient status, additional functional criteria and insurance authorization.   Follow Up Recommendations  Home health OT     Assistance Recommended at Discharge Intermittent Supervision/Assistance  Patient can return home with the following A little help with walking and/or transfers;A little help with bathing/dressing/bathroom;Assistance with cooking/housework;Assist for transportation;Help with stairs or ramp for entrance;Direct supervision/assist for medications management;Direct supervision/assist for financial management    Functional Status Assessment  Patient has had a recent decline in their  functional status and demonstrates the ability to make significant improvements in function in a reasonable and predictable amount of time.  Equipment Recommendations  None recommended by OT    Recommendations for Other Services       Precautions / Restrictions Precautions Precautions: Fall Restrictions Weight Bearing Restrictions: No      Mobility Bed Mobility Overal bed mobility: Modified Independent             General bed mobility comments: extra time    Transfers Overall transfer level: Needs assistance Equipment used: Rolling walker (2 wheels) Transfers: Sit to/from Stand Sit to Stand: Min guard           General transfer comment: min guard A for safety; cues for hand placement      Balance Overall balance assessment: Needs assistance Sitting-balance support: No upper extremity supported, Feet supported Sitting balance-Leahy Scale: Good     Standing balance support: Bilateral upper extremity supported, Reliant on assistive device for balance Standing balance-Leahy Scale: Poor Standing balance comment: benefits from UE assist during dynamic tasks                           ADL either performed or assessed with clinical judgement   ADL Overall ADL's : Needs assistance/impaired Eating/Feeding: Modified independent;Bed level Eating/Feeding Details (indicate cue type and reason): tremor affecting speed and precision, however, self feeding during session WFL Grooming: Wash/dry face;Min guard;Standing Grooming Details (indicate cue type and reason): at sink. Upper Body Bathing: Set up;Sitting   Lower Body Bathing: Min guard;Sit to/from stand   Upper Body Dressing : Set up;Sitting   Lower Body Dressing: Min guard;Sit to/from stand Lower Body Dressing Details (indicate cue type and reason): donning socks sitting EOB without A. Toilet Transfer: Min guard;Ambulation;Rolling walker (2 wheels) Toilet  Transfer Details (indicate cue type and reason):  simulated in room. cues for hand placement during transfers and to keep RW with her when backing up to EOB upon return to bed. Toileting- Clothing Manipulation and Hygiene: Supervision/safety;Sitting/lateral lean       Functional mobility during ADLs: Min guard;Rolling walker (2 wheels) General ADL Comments: Min guard A for safety during functional mobility     Vision Ability to See in Adequate Light: 0 Adequate Patient Visual Report: No change from baseline Vision Assessment?: No apparent visual deficits Additional Comments: Able to locate grooming items to wash face with white wash cloth sitting on top of white towel.     Perception     Praxis      Pertinent Vitals/Pain Pain Assessment Pain Assessment: Faces Faces Pain Scale: Hurts little more (chronic) Pain Location: left leg and hip Pain Descriptors / Indicators: Aching Pain Intervention(s): Limited activity within patient's tolerance, Monitored during session     Hand Dominance Right   Extremity/Trunk Assessment Upper Extremity Assessment Upper Extremity Assessment: Generalized weakness;RUE deficits/detail (BUE tremor at baseline, affecting finger to nose. No dysdiadokinesia noted) RUE Deficits / Details: R slightly weakner than L with push/pull. Grip equal bilaterally 4/5   Lower Extremity Assessment Lower Extremity Assessment: Defer to PT evaluation       Communication Communication Communication: No difficulties   Cognition Arousal/Alertness: Awake/alert Behavior During Therapy: WFL for tasks assessed/performed Overall Cognitive Status: Impaired/Different from baseline Area of Impairment: Orientation, Problem solving, Safety/judgement, Memory, Following commands                 Orientation Level: Disoriented to, Time (Reports that she keeps up with month and year (oriented to thiws) but rarely day/date as she is retired.)   Memory: Decreased short-term memory Following Commands: Follows one step  commands consistently, Follows one step commands with increased time (increased time for simple two step commands) Safety/Judgement: Decreased awareness of safety, Decreased awareness of deficits   Problem Solving: Slow processing, Decreased initiation, Requires verbal cues, Difficulty sequencing General Comments: not oriented to day or date, but reports thst she does not typically keep up with this due to being retired. Pt able to count backward from 20 to 1. STM difficulties; walking to sink with RW and after washing face, turning from sink (forgetting that she was standing withihn RW and asking if she was going to use a RW to walk bakc to bed)     General Comments  husband present and supportive    Exercises     Shoulder Instructions      Home Living Family/patient expects to be discharged to:: Private residence Living Arrangements: Spouse/significant other Available Help at Discharge: Family;Available PRN/intermittently (husband works part time) Type of Home: House Home Access: Level entry     [place]: One level     Bathroom Shower/Tub: Gaffer;Door   [employer] Toilet: Standard Bathroom Accessibility: Yes   Home Equipment: Air cabin crew (4 wheels);Cane - single point          Prior Functioning/Environment Prior Level of Function : Independent/Modified Independent             Mobility Comments: ind used rollator for mobility. ADLs Comments: Pt reports independent in ADL and IADL        OT Problem List: Decreased strength;Decreased activity tolerance;Impaired balance (sitting and/or standing);Decreased knowledge of use of DME or AE      OT Treatment/Interventions: Self-care/ADL training;Therapeutic exercise;DME and/or AE instruction;Therapeutic activities;Patient/family education;Balance training    OT  Goals(Current goals can be found in the care plan section) Acute Rehab OT Goals Patient Stated Goal: go home OT Goal Formulation: With  patient/family Time For Goal Achievement: 10/28/22 Potential to Achieve Goals: Good  OT Frequency: Min 2X/week    Co-evaluation              AM-PAC OT "6 Clicks" Daily Activity     Outcome Measure Help from another person eating meals?: None Help from another person taking care of personal grooming?: A Little Help from another person toileting, which includes using toliet, bedpan, or urinal?: A Little Help from another person bathing (including washing, rinsing, drying)?: A Little Help from another person to put on and taking off regular upper body clothing?: A Little Help from another person to put on and taking off regular lower body clothing?: A Little 6 Click Score: 19   End of Session Equipment Utilized During Treatment: Gait belt;Rolling walker (2 wheels) Nurse Communication: Mobility status  Activity Tolerance: Patient tolerated treatment well Patient left: in bed;with call bell/phone within reach;with bed alarm set  OT Visit Diagnosis: Unsteadiness on feet (R26.81);Muscle weakness (generalized) (M62.81);Other abnormalities of gait and mobility (R26.89);Other symptoms and signs involving cognitive function                Time: 0902-0927 OT Time Calculation (min): 25 min Charges:  OT General Charges $OT Visit: 1 Visit OT Evaluation $OT Eval Moderate Complexity: 1 Mod OT Treatments $Self Care/Home Management : 8-22 mins  Elder Cyphers, OTR/L [facility] Acute Rehabilitation Office: [phone]    Magnus Ivan 10/14/2022, 10:17 AM

## 2022-10-14 NOTE — Progress Notes (Signed)
Discharge instructions given. Patient verbalized understanding and all questions were answered.  ?

## 2022-10-14 NOTE — Discharge Summary (Signed)
[facility]  Physician Discharge Summary   Patient ID: Kimberly Barker MRN: [id number] DOB/AGE: Jun 05, 1951 72 y.o.  Admit date: 10/12/2022 Discharge date:   10/14/2022   PCP: Algis Greenhouse, MD  DISCHARGE DIAGNOSES:    Acute encephalopathy   Non-insulin dependent type 2 diabetes mellitus ([place]) Acute gastroenteritis   Abnormal EKG   Essential hypertension   Hypothyroidism   RECOMMENDATIONS FOR OUTPATIENT FOLLOW UP: Please consider checking lipid panel at follow-up Please do EKG at follow-up for PVCs.  Patient was started on metoprolol at low-dose.   Home Health: PT OT Equipment/Devices: None  CODE STATUS: Full code  DISCHARGE CONDITION: fair  Diet recommendation: Heart healthy carb modified  INITIAL HISTORY: 72 y.o. female with medical history significant of type 2 diabetes, hypertension, hyperlipidemia, hypothyroidism, GERD, anxiety/panic attacks, iron deficiency anemia, B12 deficiency, vitamin D deficiency presented to the ED with nausea, vomiting, and diarrhea ongoing for about 4 days but none in the last 1 day.  Main concern was patient is on and off confusion that has been going on for about 6 weeks.  Patient was hospitalized for further management. Patient states she was recently diagnosed with a UTI and treated with an antibiotic which she finished taking a few days ago.      HOSPITAL COURSE:   Acute metabolic encephalopathy Patient does not have any focal neurological deficits on examination.  UA does not suggest infection.  No other infectious etiology identified. Metabolic workup initiated.  TSH normal at 0.447.  B12 is pending.  Ammonia was normal. It appears that patient has been placed on multiple different medications for her hip pain recently.  The last change was initiation of gabapentin.  She has not taken this medication in the last few days.   Gabapentin was discontinued.  Mentation is improved and close to baseline.  Brain MRI does not  show any new findings.  History of CVA/hyperlipidemia Brain MRI suggest remote infarcts.  Patient has never clinically been diagnosed with a stroke previously.  She never had any strokelike symptoms in the past. Does not have any neurologic deficits currently.  Carotid Doppler does not show any significant stenosis.  She is on Zocor which can be continued.  Recommend lipid panel to be checked in the outpatient setting.  This was communicated to the patient and her husband.  Last LDL was greater than 200 based on Care Everywhere in November 2023.  Patient with intolerance to multiple statins.  Would prefer this be addressed by PCP in the outpatient setting. Started on aspirin.  She used to be on aspirin but has not been taking it for a year due to concern for bleeding but no bleeding was ever detected with negative endoscopies.  Okay to resume aspirin.   Nausea vomiting and diarrhea Could have had viral gastroenteritis.  This has resolved.  No need for stool studies.  Abdomen is benign.  CT scan of the abdomen pelvis did not show any acute findings.  Possible hemangioma was noted in the liver.  Ultrasound without any acute findings either.   Normal anion gap metabolic acidosis Resolved   Mild hypercalcemia Likely due to dehydration.  Resolved   Abnormal EKG Nonspecific ST changes and T wave inversions noted.  Patient denies any chest pain.  Troponin is normal.  Telemetry shows PVCs.  Echocardiogram shows normal systolic function.  Mild AS noted.  Started on low-dose beta-blocker.  No further workup at this time.  Outpatient follow-up with PCP.   Potassium to be  repleted prior to discharge.   Mild hyponatremia Resolved.     Diabetes mellitus type 2, controlled HbA1c 5.8.     Essential hypertension May resume home medications.   Hypothyroidism   Chronic left hip pain Imaging studies does not show any obvious injuries.  Osteoarthritis noted.   Obesity Estimated body mass index is 31.12  kg/m as calculated from the following:   Height as of this encounter: '5\' 5"'$  (1.651 m).   Weight as of this encounter: 84.8 kg.    Patient is stable.  Okay for discharge home today.  Discussed with her husband.  Home health has been arranged.  PERTINENT LABS:  The results of significant diagnostics from this hospitalization (including imaging, microbiology, ancillary and laboratory) are listed below for reference.    Microbiology: Recent Results (from the past 240 hour(s))  Resp panel by RT-PCR (RSV, Flu A&B, Covid) Nasopharyngeal Swab     Status: None   Collection Time: 10/12/22  2:10 PM   Specimen: Nasopharyngeal Swab; Nasal Swab  Result Value Ref Range Status   SARS Coronavirus 2 by RT PCR NEGATIVE NEGATIVE Final    Comment: (NOTE) SARS-CoV-2 target nucleic acids are NOT DETECTED.  The SARS-CoV-2 RNA is generally detectable in upper respiratory specimens during the acute phase of infection. The lowest concentration of SARS-CoV-2 viral copies this assay can detect is 138 copies/mL. A negative result does not preclude SARS-Cov-2 infection and should not be used as the sole basis for treatment or other patient management decisions. A negative result may occur with  improper specimen collection/handling, submission of specimen other than nasopharyngeal swab, presence of viral mutation(s) within the areas targeted by this assay, and inadequate number of viral copies(<138 copies/mL). A negative result must be combined with clinical observations, patient history, and epidemiological information. The expected result is Negative.  Fact Sheet for Patients:  EntrepreneurPulse.com.au  Fact Sheet for Healthcare Providers:  IncredibleEmployment.be  This test is no t yet approved or cleared by the [place] FDA and  has been authorized for detection and/or diagnosis of SARS-CoV-2 by FDA under an Emergency Use Authorization (EUA). This EUA will  remain  in effect (meaning this test can be used) for the duration of the COVID-19 declaration under Section 564(b)(1) of the Act, 21 U.S.C.section 360bbb-3(b)(1), unless the authorization is terminated  or revoked sooner.       Influenza A by PCR NEGATIVE NEGATIVE Final   Influenza B by PCR NEGATIVE NEGATIVE Final    Comment: (NOTE) The Xpert Xpress SARS-CoV-2/FLU/RSV plus assay is intended as an aid in the diagnosis of influenza from Nasopharyngeal swab specimens and should not be used as a sole basis for treatment. Nasal washings and aspirates are unacceptable for Xpert Xpress SARS-CoV-2/FLU/RSV testing.  Fact Sheet for Patients: EntrepreneurPulse.com.au  Fact Sheet for Healthcare Providers: IncredibleEmployment.be  This test is not yet approved or cleared by the [place] FDA and has been authorized for detection and/or diagnosis of SARS-CoV-2 by FDA under an Emergency Use Authorization (EUA). This EUA will remain in effect (meaning this test can be used) for the duration of the COVID-19 declaration under Section 564(b)(1) of the Act, 21 U.S.C. section 360bbb-3(b)(1), unless the authorization is terminated or revoked.     Resp Syncytial Virus by PCR NEGATIVE NEGATIVE Final    Comment: (NOTE) Fact Sheet for Patients: EntrepreneurPulse.com.au  Fact Sheet for Healthcare Providers: IncredibleEmployment.be  This test is not yet approved or cleared by the [place] FDA and has been authorized for detection  and/or diagnosis of SARS-CoV-2 by FDA under an Emergency Use Authorization (EUA). This EUA will remain in effect (meaning this test can be used) for the duration of the COVID-19 declaration under Section 564(b)(1) of the Act, 21 U.S.C. section 360bbb-3(b)(1), unless the authorization is terminated or revoked.  Performed at [facility], [address] [place], [place] [address]       Labs:   Basic Metabolic Panel: Recent Labs  Lab 10/12/22 1419 10/13/22 0747 10/14/22 0430  NA 132* 135 138  K 3.7 3.5 3.4*  CL 100 103 104  CO2 17* 19* 22  GLUCOSE 169* 113* 114*  BUN 42* 29* 20  CREATININE 0.94 0.87 0.77  CALCIUM 11.2* 10.3 9.9  MG  --  2.0 1.8   Liver Function Tests: Recent Labs  Lab 10/12/22 1419 10/13/22 0747 10/14/22 0430  AST '16 16 20  '$ ALT '20 17 18  '$ ALKPHOS 67 55 49  BILITOT 1.9* 2.1* 2.2*  PROT 8.2* 6.3* 5.8*  ALBUMIN 4.3 3.4* 3.2*   Recent Labs  Lab 10/12/22 1419  LIPASE 35   Recent Labs  Lab 10/13/22 0747  AMMONIA 28   CBC: Recent Labs  Lab 10/12/22 1419 10/13/22 0747 10/14/22 0430  WBC 12.5* 9.2 5.9  HGB 17.7* 15.6* 15.2*  HCT 51.9* 45.7 45.8  MCV 94.7 95.8 97.4  PLT 412* 324 295    CBG: Recent Labs  Lab 10/13/22 1625 10/13/22 2011 10/14/22 0021 10/14/22 0257 10/14/22 0646  GLUCAP 144* 137* 107* 117* 130*     IMAGING STUDIES ECHOCARDIOGRAM COMPLETE  Result Date: 10/14/2022    ECHOCARDIOGRAM REPORT   Patient Name:   ELLISYN ICENHOWER Mikami Date of Exam: 10/14/2022 Medical Rec #:  [id number]              Height:       65.0 in Accession #:    [id number]             Weight:       187.0 lb Date of Birth:  [birth year]              BSA:          1.922 m Patient Age:    68 years               BP:           165/73 mmHg Patient Gender: F                      HR:           71 bpm. Exam Location:  Inpatient Procedure: 2D Echo, Cardiac Doppler and Color Doppler Indications:    Abnormal ECG R94.31  History:        Patient has no prior history of Echocardiogram examinations.                 Risk Factors:Hypertension, Diabetes, Dyslipidemia and Sleep                 Apnea.  Sonographer:    Ronny Flurry Referring Phys: [place]  1. Left ventricular ejection fraction, by estimation, is 60 to 65%. The left ventricle has normal function. The left ventricle has no regional wall motion abnormalities. There is  mild left ventricular hypertrophy. Left ventricular diastolic parameters are indeterminate.  2. Right ventricular systolic function is normal. The right ventricular size is normal.  3. The mitral valve is abnormal. No evidence of mitral  valve regurgitation. No evidence of mitral stenosis. Moderate mitral annular calcification.  4. The aortic valve is tricuspid. There is moderate calcification of the aortic valve. There is moderate thickening of the aortic valve. Aortic valve regurgitation is not visualized. Mild aortic valve stenosis.  5. The inferior vena cava is normal in size with greater than 50% respiratory variability, suggesting right atrial pressure of 3 mmHg. FINDINGS  Left Ventricle: Left ventricular ejection fraction, by estimation, is 60 to 65%. The left ventricle has normal function. The left ventricle has no regional wall motion abnormalities. The left ventricular internal cavity size was normal in size. There is  mild left ventricular hypertrophy. Left ventricular diastolic parameters are indeterminate. Right Ventricle: The right ventricular size is normal. No increase in right ventricular wall thickness. Right ventricular systolic function is normal. Left Atrium: Left atrial size was normal in size. Right Atrium: Right atrial size was normal in size. Pericardium: Trivial pericardial effusion is present. The pericardial effusion is anterior to the right ventricle. Mitral Valve: The mitral valve is abnormal. There is mild thickening of the mitral valve leaflet(s). There is mild calcification of the mitral valve leaflet(s). Moderate mitral annular calcification. No evidence of mitral valve regurgitation. No evidence  of mitral valve stenosis. Tricuspid Valve: The tricuspid valve is normal in structure. Tricuspid valve regurgitation is not demonstrated. No evidence of tricuspid stenosis. Aortic Valve: The aortic valve is tricuspid. There is moderate calcification of the aortic valve. There is moderate  thickening of the aortic valve. Aortic valve regurgitation is not visualized. Mild aortic stenosis is present. Aortic valve mean gradient measures 13.5 mmHg. Aortic valve peak gradient measures 26.5 mmHg. Aortic valve area, by VTI measures 2.01 cm. Pulmonic Valve: The pulmonic valve was normal in structure. Pulmonic valve regurgitation is not visualized. No evidence of pulmonic stenosis. Aorta: The aortic root is normal in size and structure. Venous: The inferior vena cava is normal in size with greater than 50% respiratory variability, suggesting right atrial pressure of 3 mmHg. IAS/Shunts: No atrial level shunt detected by color flow Doppler.  LEFT VENTRICLE PLAX 2D LVIDd:         4.30 cm   Diastology LVIDs:         3.30 cm   LV e' medial:    3.45 cm/s LV PW:         1.10 cm   LV E/e' medial:  22.0 LV IVS:        1.20 cm   LV e' lateral:   4.95 cm/s LVOT diam:     2.00 cm   LV E/e' lateral: 15.3 LV SV:         96 LV SV Index:   50 LVOT Area:     3.14 cm  RIGHT VENTRICLE RV S prime:     16.30 cm/s LEFT ATRIUM             Index        RIGHT ATRIUM           Index LA diam:        3.60 cm 1.87 cm/m   RA Area:     11.95 cm LA Vol (A2C):   21.9 ml 11.39 ml/m  RA Volume:   21.50 ml  11.18 ml/m LA Vol (A4C):   55.6 ml 28.92 ml/m LA Biplane Vol: 38.1 ml 19.82 ml/m  AORTIC VALVE AV Area (Vmax):    1.84 cm AV Area (Vmean):   1.81 cm AV Area (VTI):  2.01 cm AV Vmax:           257.50 cm/s AV Vmean:          167.000 cm/s AV VTI:            0.481 m AV Peak Grad:      26.5 mmHg AV Mean Grad:      13.5 mmHg LVOT Vmax:         150.50 cm/s LVOT Vmean:        96.450 cm/s LVOT VTI:          0.307 m LVOT/AV VTI ratio: 0.64  AORTA Ao Root diam: 2.90 cm Ao Asc diam:  3.20 cm MITRAL VALVE MV Area (PHT): 2.99 cm     SHUNTS MV Decel Time: 254 msec     Systemic VTI:  0.31 m MV E velocity: 75.80 cm/s   Systemic Diam: 2.00 cm MV A velocity: 131.00 cm/s MV E/A ratio:  0.58 Jenkins Rouge MD Electronically signed by Jenkins Rouge MD  Signature Date/Time: 10/14/2022/9:27:10 AM    Final    US Abdomen Limited RUQ (LIVER/GB)  Result Date: 10/13/2022 CLINICAL DATA:  Hemangioma. EXAM: ULTRASOUND ABDOMEN LIMITED RIGHT UPPER QUADRANT COMPARISON:  CT abdomen and pelvis 10/12/2022 and noncontrast CT 07/08/2016 FINDINGS: Gallbladder: No gallstones or wall thickening visualized. No sonographic Murphy sign noted by sonographer. Common bile duct: Diameter: 3 mm, within normal limits. No intrahepatic or extrahepatic biliary ductal dilatation. Liver: There is increased echogenicity and attenuation of the hepatic parenchyma suggesting fatty infiltration as seen on yesterday's CT. Within the left hepatic lobe there is an anechoic avascular benign simple cyst measuring 1.9 x 1.1 x 1.7 cm, corresponding to the fluid density cyst seen on yesterday's CT. The hepatic dome lesion seen on yesterday's CT was unable to be imaged, likely due to patient large body habitus and rib shadowing. Other: There is mild prominence of the right renal pelvis. On yesterday's CT bilateral normal variant extrarenal pelves were seen with normal caliber of the bilateral ureters. IMPRESSION: 1. Benign left hepatic lobe simple cyst as seen on yesterday's CT. Unfortunately, due to patient large body habitus and rib shadowing, the hepatic dome region of the likely hemangioma seen on yesterday's CT was unable to be visualized on the current ultrasound. 2. Hepatic steatosis. Electronically Signed   By: Yvonne Kendall M.D.   On: 10/13/2022 16:27   VAS US CAROTID  Result Date: 10/13/2022 Carotid Arterial Duplex Study Patient Name:  BURNADETTE BASKETT Ferrie  Date of Exam:   10/13/2022 Medical Rec #: [id number]               Accession #:    [id number] Date of Birth: Feb 24, 1951               Patient Gender: F Patient Age:   70 years Exam Location:  [facility] Procedure:      VAS US CAROTID Referring Phys: Bonnielee Haff  --------------------------------------------------------------------------------  Risk Factors:      Hypertension, Diabetes. Comparison Study:  No prior studies. Performing Technologist: Oliver Hum RVT  Examination Guidelines: A complete evaluation includes B-mode imaging, spectral Doppler, color Doppler, and power Doppler as needed of all accessible portions of each vessel. Bilateral testing is considered an integral part of a complete examination. Limited examinations for reoccurring indications may be performed as noted.  Right Carotid Findings: +----------+--------+--------+--------+--------------------------+--------+           PSV cm/sEDV cm/sStenosisPlaque Description        Comments +----------+--------+--------+--------+--------------------------+--------+  CCA Prox  96      11              smooth and heterogenous            +----------+--------+--------+--------+--------------------------+--------+ CCA Distal80      11              smooth and heterogenous            +----------+--------+--------+--------+--------------------------+--------+ ICA Prox  136     19              irregular and heterogenous         +----------+--------+--------+--------+--------------------------+--------+ ICA Distal56      14                                        tortuous +----------+--------+--------+--------+--------------------------+--------+ ECA       142     17                                                 +----------+--------+--------+--------+--------------------------+--------+ +----------+--------+-------+--------+-------------------+           PSV cm/sEDV cmsDescribeArm Pressure (mmHG) +----------+--------+-------+--------+-------------------+ Subclavian122                                        +----------+--------+-------+--------+-------------------+ +---------+--------+--+--------+--+---------+ VertebralPSV cm/s89EDV cm/s11Antegrade  +---------+--------+--+--------+--+---------+  Left Carotid Findings: +----------+--------+--------+--------+--------------------------+--------+           PSV cm/sEDV cm/sStenosisPlaque Description        Comments +----------+--------+--------+--------+--------------------------+--------+ CCA Prox  90      13              smooth and heterogenous            +----------+--------+--------+--------+--------------------------+--------+ CCA Distal65      14              smooth and heterogenous            +----------+--------+--------+--------+--------------------------+--------+ ICA Prox  77      15              irregular and heterogenous         +----------+--------+--------+--------+--------------------------+--------+ ICA Distal81      19                                        tortuous +----------+--------+--------+--------+--------------------------+--------+ ECA       152     15                                                 +----------+--------+--------+--------+--------------------------+--------+ +----------+--------+--------+--------+-------------------+           PSV cm/sEDV cm/sDescribeArm Pressure (mmHG) +----------+--------+--------+--------+-------------------+ Subclavian210                                         +----------+--------+--------+--------+-------------------+ +---------+--------+---+--------+--+---------+ VertebralPSV cm/s129EDV cm/s27Antegrade +---------+--------+---+--------+--+---------+   Summary: Right Carotid:  Velocities in the right ICA are consistent with a 1-39% stenosis. Left Carotid: Velocities in the left ICA are consistent with a 1-39% stenosis. Vertebrals: Bilateral vertebral arteries demonstrate antegrade flow. *See table(s) above for measurements and observations.  Electronically signed by Deitra Mayo MD on 10/13/2022 at 4:21:25 PM.    Final    MR BRAIN WO CONTRAST  Result Date: 10/13/2022 CLINICAL DATA:   Delirium. EXAM: MRI HEAD WITHOUT CONTRAST TECHNIQUE: Multiplanar, multiecho pulse sequences of the brain and surrounding structures were obtained without intravenous contrast. COMPARISON:  Head CT October 12, 2022. FINDINGS: Brain: No acute infarction, hemorrhage, hydrocephalus, extra-axial collection or mass lesion. Remote infarct extending from the right basal ganglia to the centrum semiovale. Small remote infarct is also seen in the right paramedian pons. Scattered and confluent foci T2 hyperintensity are seen within the white matter of the cerebral hemispheres nonspecific, most likely related small vessel ischemia. Mild parenchymal volume loss. Vascular: Normal flow voids. Skull and upper cervical spine: Normal marrow signal. Sinuses/Orbits: Negative. Other: Incidentally noted 10 mm left nasolabial cyst. IMPRESSION: 1. No acute intracranial abnormality. 2. Remote infarcts in the right basal ganglia, right paramedian pons and mild to moderate chronic microvascular ischemic changes of the white matter. Electronically Signed   By: Pedro Earls M.D.   On: 10/13/2022 10:08   CT ABDOMEN PELVIS W CONTRAST  Result Date: 10/12/2022 CLINICAL DATA:  Mental status change EXAM: CT ABDOMEN AND PELVIS WITH CONTRAST TECHNIQUE: Multidetector CT imaging of the abdomen and pelvis was performed using the standard protocol following bolus administration of intravenous contrast. RADIATION DOSE REDUCTION: This exam was performed according to the departmental dose-optimization program which includes automated exposure control, adjustment of the mA and/or kV according to patient size and/or use of iterative reconstruction technique. CONTRAST:  120m OMNIPAQUE IOHEXOL 300 MG/ML  SOLN COMPARISON:  Radiograph 10/12/2022, CT 07/08/2016 FINDINGS: Lower chest: Lung bases are clear.  Mitral calcifications. Hepatobiliary: 3 cm hypodense lesion within the right hepatic dome with globular peripheral enhancement most  suggestive of hemangioma. Additional hypodensities in the left hepatic lobe without significant change since 2017 and likely due to cysts. No imaging follow-up recommended. No calcified gallstone or biliary dilatation. Pancreas: Unremarkable. No pancreatic ductal dilatation or surrounding inflammatory changes. Spleen: Normal in size without focal abnormality. Adrenals/Urinary Tract: Adrenal glands are within normal limits. Prominent bilateral renal collecting systems without hydroureter. The bladder is unremarkable. Stomach/Bowel: Stomach is within normal limits. Appendix appears normal. No evidence of bowel wall thickening, distention, or inflammatory changes. Vascular/Lymphatic: Mild aortic atherosclerosis. No aneurysm. No suspicious lymph nodes. Reproductive: Status post hysterectomy. No adnexal masses. Other: Negative for pelvic effusion or free air. Stable hypodense lesion measuring 1.9 cm in the right groin, felt consistent with benign finding. Musculoskeletal: No acute osseous abnormality IMPRESSION: 1. No CT evidence for acute intra-abdominal or pelvic abnormality. 2. 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 3. Aortic atherosclerosis. Aortic Atherosclerosis (ICD10-I70.0). Electronically Signed   By: KDonavan FoilM.D.   On: 10/12/2022 20:29   CT Head Wo Contrast  Result Date: 10/12/2022 CLINICAL DATA:  Altered mental status EXAM: CT HEAD WITHOUT CONTRAST TECHNIQUE: Contiguous axial images were obtained from the base of the skull through the vertex without intravenous contrast. RADIATION DOSE REDUCTION: This exam was performed according to the departmental dose-optimization program which includes automated exposure control, adjustment of the mA and/or kV according to patient size and/or use of iterative reconstruction technique. COMPARISON:  None Available. FINDINGS: Brain: No acute territorial infarction, hemorrhage  or intracranial mass. Mild atrophy and chronic small vessel ischemic changes of the  white matter. 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 The ventricles are nonenlarged. Vascular: No hyperdense vessels. Vertebral and carotid vascular calcification Skull: Normal. Negative for fracture or focal lesion. Sinuses/Orbits: No acute finding. Other: None IMPRESSION: 1. No CT evidence for acute intracranial abnormality. 2. Atrophy and chronic small vessel ischemic changes of the white matter. 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 Electronically Signed   By: Donavan Foil M.D.   On: 10/12/2022 20:20   DG Femur Min 2 Views Left  Result Date: 10/12/2022 CLINICAL DATA:  Pain, weakness, left hip pain EXAM: LEFT FEMUR 2 VIEWS COMPARISON:  None Available. FINDINGS: There is no evidence of acute fracture. Alignment is normal. There is mild-to-moderate osteoarthritis of the left hip. There is tricompartment osteoarthritis of the left knee. Vascular calcifications. IMPRESSION: No evidence of acute fracture. 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 Electronically Signed   By: Maurine Simmering M.D.   On: 10/12/2022 20:15   DG Pelvis 1-2 Views  Result Date: 10/12/2022 CLINICAL DATA:  Left hip pain for a month EXAM: PELVIS - 1 VIEW COMPARISON:  None Available. FINDINGS: Osteopenia. No fracture or dislocation. Preserved joint spaces. Only mild osteophytes along the sacroiliac joints. Mild degenerative changes along the lower lumbar spine. There is a questionable lucent lesion involving the proximal left femoral shaft just distal to the lesser trochanter. Recommend dedicated femur x-rays. IMPRESSION: Osteopenia with mild degenerative changes. Questionable lucent lesion versus artifact along the proximal left femoral shaft. Recommend correlation to any dedicated femur x-rays when appropriate if there is no prior. This could represent asymmetric fatty marrow.  Electronically Signed   By: Jill Side M.D.   On: 10/12/2022 17:57    DISCHARGE EXAMINATION: Vitals:   10/13/22 0852 10/13/22 1200 10/13/22 2009 10/13/22 2300  BP: (!) 174/77  (!) 167/77 (!) 165/73  Pulse: 74 89 80   Resp: '20  17 18  '$ Temp: 97.6 F (36.4 C) 98 F (36.7 C) 98 F (36.7 C) 98.6 F (37 C)  TempSrc: Oral Oral Oral Oral  SpO2: 95% 100% 97%   Weight:      Height:       General appearance: Awake alert.  In no distress Resp: Clear to auscultation bilaterally.  Normal effort Cardio: S1-S2 is normal regular.  No S3-S4.  No rubs murmurs or bruit GI: Abdomen is soft.  Nontender nondistended.  Bowel sounds are present normal.  No masses organomegaly   DISPOSITION: Home  Discharge Instructions     Call MD for:  difficulty breathing, headache or visual disturbances   Complete by: As directed    Call MD for:  extreme fatigue   Complete by: As directed    Call MD for:  persistant dizziness or light-headedness   Complete by: As directed    Call MD for:  persistant nausea and vomiting   Complete by: As directed    Call MD for:  severe uncontrolled pain   Complete by: As directed    Call MD for:  temperature >100.4   Complete by: As directed    Diet - low sodium heart healthy   Complete by: As directed    Discharge instructions   Complete by: As directed    You have been started on a baby aspirin.  You have been started on low-dose metoprolol as discussed  with your husband.  Please follow-up with primary care provider within 1 week.  Please have your cholesterol levels checked to see what your LDL level is.  Your LDL level needs to be ideally less than 70.  It was greater than 200 when last checked in November.  You may need additional medications for your high cholesterol. Please stop taking gabapentin.   You were cared for by a hospitalist during your hospital stay. If you have any questions about your discharge medications or the care you received while you were in the  hospital after you are discharged, you can call the unit and asked to speak with the hospitalist on call if the hospitalist that took care of you is not available. Once you are discharged, your primary care physician will handle any further medical issues. Please note that NO REFILLS for any discharge medications will be authorized once you are discharged, as it is imperative that you return to your primary care physician (or establish a relationship with a primary care physician if you do not have one) for your aftercare needs so that they can reassess your need for medications and monitor your lab values. If you do not have a primary care physician, you can call [phone] for a physician referral.   Increase activity slowly   Complete by: As directed    No wound care   Complete by: As directed           Allergies as of 10/14/2022       Reactions   Atorvastatin Other (See Comments)   Myalgia   Rosuvastatin    Myalgias (intolerance)        Medication List     STOP taking these medications    cefdinir 300 MG capsule Commonly known as: OMNICEF   gabapentin 300 MG capsule Commonly known as: NEURONTIN       TAKE these medications    aspirin EC 81 MG tablet Take 1 tablet (81 mg total) by mouth daily. Swallow whole.   calcium carbonate 1500 (600 Ca) MG Tabs tablet Commonly known as: OSCAL Take 600 mg by mouth 2 (two) times daily.   cholecalciferol 25 MCG (1000 UNIT) tablet Commonly known as: VITAMIN D3 Take 1,000 Units by mouth in the morning and at bedtime.   Cymbalta 30 MG capsule Generic drug: DULoxetine Take 30 mg by mouth daily.   empagliflozin 25 MG Tabs tablet Commonly known as: JARDIANCE Take 25 mg by mouth daily.   irbesartan 150 MG tablet Commonly known as: AVAPRO Take 150 mg by mouth daily.   levothyroxine 100 MCG tablet Commonly known as: SYNTHROID Take 100 mcg by mouth daily before breakfast.   metFORMIN 500 MG 24 hr tablet Commonly known as:  GLUCOPHAGE-XR Take 1,000 mg by mouth 2 (two) times daily.   metoprolol tartrate 25 MG tablet Commonly known as: LOPRESSOR Take 0.5 tablets (12.5 mg total) by mouth 2 (two) times daily.   simvastatin 40 MG tablet Commonly known as: ZOCOR Take 40 mg by mouth daily at 6 PM.   Trulicity 3 DG/3.8VF Sopn Generic drug: Dulaglutide Inject 3 mg into the muscle every Wednesday.          Follow-up Information     Care, [facility] Follow up.   Specialty: [place] Why: The home health agency will contact you for the first home visit Contact information: [place] [address] [phone]         Teressa Lower  L, MD. Schedule an appointment as soon as possible for a visit in 1 week(s).   Specialty: Family Medicine Why: post hospitalization follow up Contact information: [address] [place] [address] [phone]                 TOTAL DISCHARGE TIME: 68 minutes  [place]  [facility] Pager on [email or web address]  10/14/2022, 10:44 AM

## 2022-10-14 NOTE — Progress Notes (Signed)
Echocardiogram 2D Echocardiogram has been performed.  Kimberly Barker 10/14/2022, 9:12 AM

## 2022-10-14 NOTE — Plan of Care (Signed)
  Problem: Education: Goal: Knowledge of General Education information will improve Description: Including pain rating scale, medication(s)/side effects and non-pharmacologic comfort measures Outcome: Progressing   Problem: Nutrition: Goal: Adequate nutrition will be maintained Outcome: Progressing   Problem: Safety: Goal: Ability to remain free from injury will improve Outcome: Progressing   

## 2022-10-14 NOTE — TOC Transition Note (Signed)
Transition of Care [facility]) - CM/SW Discharge Note   Patient Details  Name: Kimberly Barker MRN: [id number] Date of Birth: Apr 18, 1951  Transition of Care [facility]) CM/SW Contact:  Pollie Friar, RN Phone Number: 10/14/2022, 10:39 AM   Clinical Narrative:    Pt is discharging home with home health services through [place]. Information on the AVS. Pt has needed DME at home.  Spouse to provide transportation home.   Final next level of care: Home w Home Health Services Barriers to Discharge: No Barriers Identified   Patient Goals and CMS Choice CMS Medicare.gov Compare Post Acute Care list provided to:: Patient Choice offered to / list presented to : Patient, Spouse  Discharge Placement                         Discharge Plan and Services Additional resources added to the After Visit Summary for     Discharge Planning Services: CM Consult Post Acute Care Choice: Home Health                    HH Arranged: PT, OT [facility] Agency: [place] Date [place]: 10/13/22   Representative spoke with at [place]: [place] Determinants of Health ([place]) Interventions SDOH Screenings   Food Insecurity: No Food Insecurity (10/13/2022)  Housing: Low Risk  (10/13/2022)  Transportation Needs: No Transportation Needs (10/13/2022)  Utilities: Not At Risk (10/13/2022)  Tobacco Use: Low Risk  (10/12/2022)     Readmission Risk Interventions     No data to display

## 2022-10-20 ENCOUNTER — Inpatient Hospital Stay: Payer: Medicare PPO

## 2022-10-21 ENCOUNTER — Encounter: Payer: Self-pay | Admitting: Oncology

## 2022-10-21 NOTE — Addendum Note (Signed)
Addended by: Juanetta Beets on: 10/21/2022 05:21 PM   Modules accepted: Orders

## 2022-10-22 ENCOUNTER — Inpatient Hospital Stay: Payer: Medicare PPO | Attending: Oncology

## 2022-10-22 VITALS — BP 122/68 | HR 69 | Temp 98.0°F | Resp 18 | Wt 177.0 lb

## 2022-10-22 DIAGNOSIS — E538 Deficiency of other specified B group vitamins: Secondary | ICD-10-CM | POA: Diagnosis present

## 2022-10-22 DIAGNOSIS — D509 Iron deficiency anemia, unspecified: Secondary | ICD-10-CM | POA: Diagnosis present

## 2022-10-22 MED ORDER — CYANOCOBALAMIN 1000 MCG/ML IJ SOLN
1000.0000 ug | Freq: Once | INTRAMUSCULAR | Status: AC
  Administered 2022-10-22: 1000 ug via INTRAMUSCULAR
  Filled 2022-10-22: qty 1

## 2022-10-22 NOTE — Patient Instructions (Signed)

## 2022-10-23 ENCOUNTER — Inpatient Hospital Stay: Payer: Medicare PPO

## 2022-11-12 ENCOUNTER — Inpatient Hospital Stay: Payer: Medicare PPO

## 2022-11-20 ENCOUNTER — Inpatient Hospital Stay: Payer: Medicare PPO

## 2022-12-18 ENCOUNTER — Inpatient Hospital Stay: Payer: Medicare PPO

## 2023-01-15 ENCOUNTER — Inpatient Hospital Stay: Payer: Medicare PPO

## 2023-02-04 NOTE — Progress Notes (Signed)
Patient Care Team: Olive Bass, MD as PCP - General (Family Medicine)  Clinic Day:  02/09/23  Referring physician: Olive Bass, MD  ASSESSMENT & PLAN:  Assessment & Plan: Iron deficiency anemia She has only had a partial response to oral supplement despite being on this for 6 months.  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 She received IV iron in June of 2023.  Strong family history for malignancy This is outlined below and includes ovarian and colon cancer.  I do recommend a genetics clinic referral for further evaluation and recommendations.   B12 Deficiency B12 level was 156 on 03/05/22. She stopped monthly B12 injections in January. B12 level for today is pending.   Plan:  She last had IV iron about a year ago in June, 2023. Her WBC was 5.9, hemoglobin 15.2, and platelet count of 295,000 on 10/14/2022. She no longer takes B-12 injections since January so we will recheck her level today. Her labs today are pending and I will add B-12 to her labs. She has a tremor of both hands but takes medication to ease symptoms, she also just completed physical therapy.  Her last bone density scan revealed osteopenia on 03/12/2021 and she will be due by end of June. She will be due for her next annual mammogram at the end of July. I will see if I can schedule both on the same day.  I will see her back in 6 months with CBC, CMP, B-12, and iron studies. I discussed the assessment and treatment plan with the patient. The patient was provided an opportunity to ask questions and all were answered.  The patient agreed with the plan and demonstrated an understanding of the instructions.  The patient was advised to call back if the symptoms worsen or if the condition fails to improve as anticipated.  I provided 20 minutes  of face-to-face time during this this encounter and > 50% was spent counseling as documented  under my assessment and plan.   Dellia Beckwith, MD  [facility] [address] [place] [address] Dept: [phone] Dept Fax: [phone]   No orders of the defined types were placed in this encounter.    CHIEF COMPLAINT:  CC: A 72 y.o. female with history of iron deficiency anemia and B12 deficiency  Current Treatment:  Surveillance  INTERVAL HISTORY:  Kimberly Barker is here today for repeat clinical assessment for iron deficiency anemia and B12 deficiency. Patient states that she feels ok and complains of lower back and left hip pain. She last had IV iron about a year ago in June, 2023. Her WBC was 5.9, hemoglobin 15.2, and platelet count of 295,000 on 10/14/2022. She no longer takes B-12 injections since January so we will recheck her level today. Her labs today are pending and I will add B-12 to her labs. She has a tremor of both hands but takes medication to ease symptoms, she also just completed physical therapy.  Her last bone density scan revealed osteopenia on 03/12/2021 and she will be due by end of June. She will be due for her next annual mammogram at the end of July. I will see if I can schedule both on the same day.  I will see her back in 6 months with CBC, CMP, B-12, and iron studies.   She denies signs of  infection such as sore throat, sinus drainage, cough, or urinary symptoms.  She denies fevers or recurrent chills. She denies pain. She denies nausea, vomiting, chest pain, dyspnea or cough. Her appetite is good and her weight has increased 8 pounds over last 3 months .   I have reviewed the past medical history, past surgical history, social history and family history with the patient and they are unchanged from previous note.  ALLERGIES:  is allergic to atorvastatin, other, and rosuvastatin.  MEDICATIONS:  Current Outpatient Medications  Medication Sig Dispense Refill   ezetimibe (ZETIA) 10 MG tablet       aspirin EC 81 MG tablet Take 1 tablet (81 mg total) by mouth daily. Swallow whole. 30 tablet 11   calcium carbonate (OSCAL) 1500 (600 Ca) MG TABS tablet Take 600 mg by mouth 2 (two) times daily.      cholecalciferol (VITAMIN D3) 25 MCG (1000 UNIT) tablet Take 1,000 Units by mouth in the morning and at bedtime.     CYMBALTA 30 MG capsule Take 30 mg by mouth daily.     empagliflozin (JARDIANCE) 25 MG TABS tablet Take 25 mg by mouth daily.     gabapentin (NEURONTIN) 300 MG capsule Take by mouth.     irbesartan (AVAPRO) 150 MG tablet Take 150 mg by mouth daily.     levothyroxine (SYNTHROID) 100 MCG tablet Take 100 mcg by mouth daily before breakfast.     meloxicam (MOBIC) 15 MG tablet Take by mouth.     metFORMIN (GLUCOPHAGE-XR) 500 MG 24 hr tablet Take 1,000 mg by mouth 2 (two) times daily.     metoprolol tartrate (LOPRESSOR) 25 MG tablet Take 0.5 tablets (12.5 mg total) by mouth 2 (two) times daily. 30 tablet 2   simvastatin (ZOCOR) 40 MG tablet Take 40 mg by mouth daily at 6 PM.     TRULICITY 3 MG/0.5ML SOPN Inject 3 mg into the muscle every Wednesday.     No current facility-administered medications for this visit.    HISTORY OF PRESENT ILLNESS:   Oncology History   No history exists.    REVIEW OF SYSTEMS:  Review of Systems  Constitutional:  Positive for fatigue. Negative for appetite change, chills, diaphoresis, fever and unexpected weight change.  HENT:  Negative.  Negative for hearing loss, lump/mass, mouth sores, nosebleeds, sore throat, tinnitus, trouble swallowing and voice change.   Eyes: Negative.  Negative for eye problems and icterus.  Respiratory: Negative.  Negative for chest tightness, cough, hemoptysis, shortness of breath and wheezing.   Cardiovascular: Negative.  Negative for chest pain, leg swelling and palpitations.  Gastrointestinal: Negative.  Negative for abdominal distention, abdominal pain, blood in stool, constipation, diarrhea, nausea, rectal pain and  vomiting.  Endocrine: Negative.   Genitourinary: Negative.  Negative for bladder incontinence, difficulty urinating, dyspareunia, dysuria, frequency, hematuria, menstrual problem, nocturia, pelvic pain, vaginal bleeding and vaginal discharge.   Musculoskeletal:  Positive for back pain (lower back). Negative for arthralgias, flank pain, gait problem, myalgias, neck pain and neck stiffness.       Left hip pain  Skin: Negative.  Negative for itching, rash and wound.  Neurological: Negative.  Negative for dizziness, extremity weakness, gait problem, headaches, light-headedness, numbness, seizures and speech difficulty.  Hematological: Negative.  Negative for adenopathy. Does not bruise/bleed easily.  Psychiatric/Behavioral: Negative.  Negative for confusion, decreased concentration, depression, sleep disturbance and suicidal ideas. The patient is not nervous/anxious.    VITALS:  Blood pressure 139/76, pulse 84, temperature 97.6 F (  36.4 C), temperature source Oral, resp. rate 18, height 5\' 5"  (1.651 m), weight 185 lb 4.8 oz (84.1 kg), last menstrual period 02/20/2000, SpO2 97 %.  Wt Readings from Last 3 Encounters:  02/09/23 185 lb 4.8 oz (84.1 kg)  10/22/22 177 lb (80.3 kg)  10/12/22 187 lb (84.8 kg)    Body mass index is 30.84 kg/m.  Performance status (ECOG): 1 - Symptomatic but completely ambulatory  PHYSICAL EXAM:  Physical Exam Vitals and nursing note reviewed.  Constitutional:      General: She is not in acute distress.    Appearance: Normal appearance. She is normal weight. She is not ill-appearing, toxic-appearing or diaphoretic.  HENT:     Head: Normocephalic and atraumatic.     Right Ear: Tympanic membrane, ear canal and external ear normal. There is no impacted cerumen.     Left Ear: Tympanic membrane, ear canal and external ear normal. There is no impacted cerumen.     Nose: Nose normal. No congestion or rhinorrhea.     Mouth/Throat:     Mouth: Mucous membranes are moist.      Pharynx: Oropharynx is clear. No oropharyngeal exudate or posterior oropharyngeal erythema.  Eyes:     General: No scleral icterus.       Right eye: No discharge.        Left eye: No discharge.     Extraocular Movements: Extraocular movements intact.     Conjunctiva/sclera: Conjunctivae normal.     Pupils: Pupils are equal, round, and reactive to light.  Neck:     Vascular: No carotid bruit.  Cardiovascular:     Rate and Rhythm: Normal rate and regular rhythm.     Pulses: Normal pulses.     Heart sounds: Normal heart sounds. No murmur heard.    No friction rub. No gallop.  Pulmonary:     Effort: Pulmonary effort is normal. No respiratory distress.     Breath sounds: Normal breath sounds. No stridor. No wheezing, rhonchi or rales.  Chest:     Chest wall: No tenderness.  Abdominal:     General: Bowel sounds are normal. There is no distension.     Palpations: Abdomen is soft. There is no hepatomegaly, splenomegaly or mass.     Tenderness: There is abdominal tenderness in the epigastric area. There is no right CVA tenderness, left CVA tenderness, guarding or rebound.     Hernia: No hernia is present.     Comments: Mild epigastric tenderness.    Musculoskeletal:        General: No swelling, tenderness, deformity or signs of injury. Normal range of motion.     Cervical back: Normal range of motion and neck supple. No rigidity or tenderness.     Right lower leg: No edema.     Left lower leg: No edema.  Lymphadenopathy:     Cervical: No cervical adenopathy.  Skin:    General: Skin is warm and dry.     Coloration: Skin is not jaundiced or pale.     Findings: No bruising, erythema, lesion or rash.  Neurological:     General: No focal deficit present.     Mental Status: She is alert and oriented to person, place, and time. Mental status is at baseline.     Cranial Nerves: No cranial nerve deficit.     Sensory: No sensory deficit.     Motor: Tremor present. No weakness.      Coordination: Coordination normal.  Gait: Gait normal.     Deep Tendon Reflexes: Reflexes normal.     Comments: Tremor of both hands  Psychiatric:        Mood and Affect: Mood normal.        Behavior: Behavior normal.        Thought Content: Thought content normal.        Judgment: Judgment normal.    LABORATORY DATA:  I have reviewed the data as listed Component Ref Range & Units 10/29/2022  Sodium 135 - 146 MMOL/L 142  Potassium 3.5 - 5.3 MMOL/L 4.2  Chloride 98 - 110 MMOL/L 106  CO2 21 - 31 MMOL/L 27  BUN 8 - 24 MG/DL 11  Glucose 70 - 99 MG/DL 90  Creatinine 1.61 - 0.96 MG/DL 0.45 Low   Calcium 8.5 - 10.5 MG/DL 9.9  Anion Gap 4 - 14 MMOL/L 9  Est. GFR >=60 ML/MIN/1.73 M*2 >90   Component Ref Range & Units 10/29/2022  Magnesium 1.9 - 2.7 MG/DL 1.2 Low       Component Value Date/Time   NA 138 10/14/2022 0430   K 3.4 (L) 10/14/2022 0430   CL 104 10/14/2022 0430   CO2 22 10/14/2022 0430   GLUCOSE 114 (H) 10/14/2022 0430   BUN 20 10/14/2022 0430   CREATININE 0.77 10/14/2022 0430   CREATININE 0.58 08/10/2022 0955   CALCIUM 9.9 10/14/2022 0430   PROT 5.8 (L) 10/14/2022 0430   ALBUMIN 3.2 (L) 10/14/2022 0430   AST 20 10/14/2022 0430   AST 18 08/10/2022 0955   ALT 18 10/14/2022 0430   ALT 32 08/10/2022 0955   ALKPHOS 49 10/14/2022 0430   BILITOT 2.2 (H) 10/14/2022 0430   BILITOT 1.5 (H) 08/10/2022 0955   GFRNONAA >60 10/14/2022 0430   GFRNONAA >60 08/10/2022 0955    No results found for: "SPEP", "UPEP"      Latest Ref Rng & Units 02/09/2023   11:09 AM 10/14/2022    4:30 AM 10/13/2022    7:47 AM  CBC  WBC 4.0 - 10.5 K/uL 6.0  5.9  9.2   Hemoglobin 12.0 - 15.0 g/dL 40.9  81.1  91.4   Hematocrit 36.0 - 46.0 % 48.2  45.8  45.7   Platelets 150 - 400 K/uL 308  295  324      RADIOGRAPHIC STUDIES: I have personally reviewed the radiological images as listed and agreed with the findings in the report. Exam: 04/16/2022  DIGITAL SCREENING BILATERAL  MAMMOGRAM WITH TOMOSYNTHESIS AND CAD TECHNIQUE: Bilateral screening digital craniocaudal and mediolateral oblique mammograms were obtained. Bilateral screening digital breast tomosynthesis was performed. The images were evaluated with computer-aided detection. COMPARISON:  Previous exam(s). ACR Breast Density Category a: The breast tissue is almost entirely fatty. FINDINGS: There are no findings suspicious for malignancy IMPRESSION: No mammographic evidence of malignancy. A result letter of this screening mammogram will be mailed directly to the patient.    I,Jasmine M Lassiter,acting as a scribe for Dellia Beckwith, MD.,have documented all relevant documentation on the behalf of Dellia Beckwith, MD,as directed by  Dellia Beckwith, MD while in the presence of Dellia Beckwith, MD.  I have reviewed this report as typed by the medical scribe, and it is complete and accurate.  Dellia Beckwith   02/21/23 3:53 PM

## 2023-02-08 ENCOUNTER — Ambulatory Visit: Payer: Medicare PPO | Admitting: Oncology

## 2023-02-08 ENCOUNTER — Other Ambulatory Visit: Payer: Medicare PPO

## 2023-02-09 ENCOUNTER — Inpatient Hospital Stay: Payer: Medicare PPO | Admitting: Oncology

## 2023-02-09 ENCOUNTER — Inpatient Hospital Stay: Payer: Medicare PPO | Attending: Oncology

## 2023-02-09 ENCOUNTER — Other Ambulatory Visit: Payer: Self-pay

## 2023-02-09 ENCOUNTER — Other Ambulatory Visit: Payer: Self-pay | Admitting: Oncology

## 2023-02-09 ENCOUNTER — Encounter: Payer: Self-pay | Admitting: Oncology

## 2023-02-09 VITALS — BP 139/76 | HR 84 | Temp 97.6°F | Resp 18 | Ht 65.0 in | Wt 185.3 lb

## 2023-02-09 DIAGNOSIS — E538 Deficiency of other specified B group vitamins: Secondary | ICD-10-CM | POA: Diagnosis present

## 2023-02-09 DIAGNOSIS — Z800 Family history of malignant neoplasm of digestive organs: Secondary | ICD-10-CM | POA: Insufficient documentation

## 2023-02-09 DIAGNOSIS — D519 Vitamin B12 deficiency anemia, unspecified: Secondary | ICD-10-CM

## 2023-02-09 DIAGNOSIS — Z8041 Family history of malignant neoplasm of ovary: Secondary | ICD-10-CM | POA: Insufficient documentation

## 2023-02-09 DIAGNOSIS — D509 Iron deficiency anemia, unspecified: Secondary | ICD-10-CM | POA: Insufficient documentation

## 2023-02-09 LAB — CBC WITH DIFFERENTIAL (CANCER CENTER ONLY)
Abs Immature Granulocytes: 0.01 10*3/uL (ref 0.00–0.07)
Basophils Absolute: 0.1 10*3/uL (ref 0.0–0.1)
Basophils Relative: 1 %
Eosinophils Absolute: 0.1 10*3/uL (ref 0.0–0.5)
Eosinophils Relative: 1 %
HCT: 48.2 % — ABNORMAL HIGH (ref 36.0–46.0)
Hemoglobin: 15.1 g/dL — ABNORMAL HIGH (ref 12.0–15.0)
Immature Granulocytes: 0 %
Lymphocytes Relative: 34 %
Lymphs Abs: 2 10*3/uL (ref 0.7–4.0)
MCH: 30.9 pg (ref 26.0–34.0)
MCHC: 31.3 g/dL (ref 30.0–36.0)
MCV: 98.6 fL (ref 80.0–100.0)
Monocytes Absolute: 0.5 10*3/uL (ref 0.1–1.0)
Monocytes Relative: 8 %
Neutro Abs: 3.4 10*3/uL (ref 1.7–7.7)
Neutrophils Relative %: 56 %
Platelet Count: 308 10*3/uL (ref 150–400)
RBC: 4.89 MIL/uL (ref 3.87–5.11)
RDW: 12.4 % (ref 11.5–15.5)
WBC Count: 6 10*3/uL (ref 4.0–10.5)
nRBC: 0 % (ref 0.0–0.2)

## 2023-02-09 LAB — IRON AND TIBC
Iron: 45 ug/dL (ref 28–170)
Saturation Ratios: 11 % (ref 10.4–31.8)
TIBC: 406 ug/dL (ref 250–450)
UIBC: 361 ug/dL

## 2023-02-09 LAB — FERRITIN: Ferritin: 34 ng/mL (ref 11–307)

## 2023-02-09 LAB — VITAMIN B12: Vitamin B-12: 224 pg/mL (ref 180–914)

## 2023-02-11 ENCOUNTER — Encounter: Payer: Self-pay | Admitting: Oncology

## 2023-02-11 ENCOUNTER — Telehealth: Payer: Self-pay | Admitting: Oncology

## 2023-02-11 NOTE — Addendum Note (Signed)
Addended by: Domenic Schwab on: 02/11/2023 05:09 PM   Modules accepted: Orders

## 2023-02-11 NOTE — Telephone Encounter (Signed)
Patient has been scheduled. Aware of appt date and time   Scheduling Message Entered by Gery Pray H on 02/09/2023 at 12:38 PM Priority: Routine <No visit type provided>  Department: [facility]  Provider:  Scheduling Notes:  RT 6 months with labs

## 2023-02-12 ENCOUNTER — Inpatient Hospital Stay: Payer: Medicare PPO

## 2023-02-12 ENCOUNTER — Telehealth: Payer: Self-pay | Admitting: Oncology

## 2023-02-12 NOTE — Telephone Encounter (Signed)
They are currently on the road and do not have an appt calendar available, will call back to schedule appts.   Scheduling Message Entered by Domenic Schwab on 02/11/2023 at  4:58 PM Priority: Routine <No visit type provided>  Department: [facility]  Provider:  Appointment Notes:  Please schedule pt for 5 doses of venofer.  She also needs B12 injections monthly either here or Dr. Robyne Peers office.  Scheduling Notes:

## 2023-02-16 ENCOUNTER — Telehealth: Payer: Self-pay | Admitting: Oncology

## 2023-02-16 ENCOUNTER — Telehealth: Payer: Self-pay

## 2023-02-16 NOTE — Telephone Encounter (Signed)
Attempted to contact patient. No answer. 

## 2023-02-16 NOTE — Telephone Encounter (Signed)
Patient has been scheduled. Aware of appt dates and times.   Scheduling Message Entered by Domenic Schwab on 02/11/2023 at  4:58 PM Priority: Routine <No visit type provided>  Department: [facility]  Provider:  Appointment Notes:  Please schedule pt for 5 doses of venofer.  She also needs B12 injections monthly either here or Dr. Robyne Peers office.  Scheduling Notes:

## 2023-02-16 NOTE — Telephone Encounter (Signed)
-----   Message from Dellia Beckwith, MD sent at 02/09/2023  5:53 PM EDT ----- Regarding: call, IV iron Tell her she is still very low on iron and I rec IV infusions again. Her B12 level is low normal at 224 so I rec she resume monthly injections, either here or Dr. Sol Passer (send him copy of today's labs) The good news is her hgb is holding well at 15

## 2023-02-19 ENCOUNTER — Encounter: Payer: Self-pay | Admitting: Oncology

## 2023-02-19 MED FILL — Iron Sucrose Inj 20 MG/ML (Fe Equiv): INTRAVENOUS | Qty: 10 | Status: AC

## 2023-02-19 NOTE — Addendum Note (Signed)
Addended by: Domenic Schwab on: 02/19/2023 12:24 PM   Modules accepted: Orders

## 2023-02-22 ENCOUNTER — Inpatient Hospital Stay: Payer: Medicare PPO | Attending: Oncology

## 2023-02-22 VITALS — BP 150/70 | HR 75 | Temp 97.9°F | Resp 18 | Ht 65.0 in | Wt 185.0 lb

## 2023-02-22 DIAGNOSIS — D509 Iron deficiency anemia, unspecified: Secondary | ICD-10-CM | POA: Insufficient documentation

## 2023-02-22 DIAGNOSIS — E538 Deficiency of other specified B group vitamins: Secondary | ICD-10-CM | POA: Insufficient documentation

## 2023-02-22 MED ORDER — SODIUM CHLORIDE 0.9 % IV SOLN
200.0000 mg | Freq: Once | INTRAVENOUS | Status: AC
  Administered 2023-02-22: 200 mg via INTRAVENOUS
  Filled 2023-02-22: qty 200

## 2023-02-22 MED ORDER — CYANOCOBALAMIN 1000 MCG/ML IJ SOLN
1000.0000 ug | Freq: Once | INTRAMUSCULAR | Status: AC
  Administered 2023-02-22: 1000 ug via INTRAMUSCULAR
  Filled 2023-02-22: qty 1

## 2023-02-22 MED ORDER — SODIUM CHLORIDE 0.9 % IV SOLN: Freq: Once | INTRAVENOUS | Status: AC

## 2023-02-22 NOTE — Patient Instructions (Signed)
Vitamin B12 Injection What is this medication? Vitamin B12 (VAHY tuh min B12) prevents and treats low vitamin B12 levels in your body. It is used in people who do not get enough vitamin B12 from their diet or when their digestive tract does not absorb enough. Vitamin B12 plays an important role in maintaining the health of your nervous system and red blood cells. This medicine may be used for other purposes; ask your health care provider or pharmacist if you have questions. COMMON BRAND NAME(S): B-12 Compliance Kit, B-12 Injection Kit, Cyomin, Dodex, LA-12, Nutri-Twelve, Physicians EZ Use B-12, Primabalt What should I tell my care team before I take this medication? They need to know if you have any of these conditions: Kidney disease Leber's disease Megaloblastic anemia An unusual or allergic reaction to cyanocobalamin, cobalt, other medications, foods, dyes, or preservatives Pregnant or trying to get pregnant Breast-feeding How should I use this medication? This medication is injected into a muscle or deeply under the skin. It is usually given in a clinic or care team's office. However, your care team may teach you how to inject yourself. Follow all instructions. Talk to your care team about the use of this medication in children. Special care may be needed. Overdosage: If you think you have taken too much of this medicine contact a poison control center or emergency room at once. NOTE: This medicine is only for you. Do not share this medicine with others. What if I miss a dose? If you are given your dose at a clinic or care team's office, call to reschedule your appointment. If you give your own injections, and you miss a dose, take it as soon as you can. If it is almost time for your next dose, take only that dose. Do not take double or extra doses. What may interact with this medication? Alcohol Colchicine This list may not describe all possible interactions. Give your health care  provider a list of all the medicines, herbs, non-prescription drugs, or dietary supplements you use. Also tell them if you smoke, drink alcohol, or use illegal drugs. Some items may interact with your medicine. What should I watch for while using this medication? Visit your care team regularly. You may need blood work done while you are taking this medication. You may need to follow a special diet. Talk to your care team. Limit your alcohol intake and avoid smoking to get the best benefit. What side effects may I notice from receiving this medication? Side effects that you should report to your care team as soon as possible: Allergic reactions--skin rash, itching, hives, swelling of the face, lips, tongue, or throat Swelling of the ankles, hands, or feet Trouble breathing Side effects that usually do not require medical attention (report to your care team if they continue or are bothersome): Diarrhea This list may not describe all possible side effects. Call your doctor for medical advice about side effects. You may report side effects to FDA at 1-800-FDA-1088. Where should I keep my medication? Keep out of the reach of children. Store at room temperature between 15 and 30 degrees C (59 and 85 degrees F). Protect from light. Throw away any unused medication after the expiration date. NOTE: This sheet is a summary. It may not cover all possible information. If you have questions about this medicine, talk to your doctor, pharmacist, or health care provider.  2024 Elsevier/Gold Standard (2021-05-20 00:00:00) Iron Sucrose Injection What is this medication? IRON SUCROSE (EYE ern SOO krose) treats  low levels of iron (iron deficiency anemia) in people with kidney disease. Iron is a mineral that plays an important role in making red blood cells, which carry oxygen from your lungs to the rest of your body. This medicine may be used for other purposes; ask your health care provider or pharmacist if you  have questions. COMMON BRAND NAME(S): Venofer What should I tell my care team before I take this medication? They need to know if you have any of these conditions: Anemia not caused by low iron levels Heart disease High levels of iron in the blood Kidney disease Liver disease An unusual or allergic reaction to iron, other medications, foods, dyes, or preservatives Pregnant or trying to get pregnant Breastfeeding How should I use this medication? This medication is for infusion into a vein. It is given in a hospital or clinic setting. Talk to your care team about the use of this medication in children. While this medication may be prescribed for children as young as 2 years for selected conditions, precautions do apply. Overdosage: If you think you have taken too much of this medicine contact a poison control center or emergency room at once. NOTE: This medicine is only for you. Do not share this medicine with others. What if I miss a dose? Keep appointments for follow-up doses. It is important not to miss your dose. Call your care team if you are unable to keep an appointment. What may interact with this medication? Do not take this medication with any of the following: Deferoxamine Dimercaprol Other iron products This medication may also interact with the following: Chloramphenicol Deferasirox This list may not describe all possible interactions. Give your health care provider a list of all the medicines, herbs, non-prescription drugs, or dietary supplements you use. Also tell them if you smoke, drink alcohol, or use illegal drugs. Some items may interact with your medicine. What should I watch for while using this medication? Visit your care team regularly. Tell your care team if your symptoms do not start to get better or if they get worse. You may need blood work done while you are taking this medication. You may need to follow a special diet. Talk to your care team. Foods that  contain iron include: whole grains/cereals, dried fruits, beans, or peas, leafy green vegetables, and organ meats (liver, kidney). What side effects may I notice from receiving this medication? Side effects that you should report to your care team as soon as possible: Allergic reactions--skin rash, itching, hives, swelling of the face, lips, tongue, or throat Low blood pressure--dizziness, feeling faint or lightheaded, blurry vision Shortness of breath Side effects that usually do not require medical attention (report to your care team if they continue or are bothersome): Flushing Headache Joint pain Muscle pain Nausea Pain, redness, or irritation at injection site This list may not describe all possible side effects. Call your doctor for medical advice about side effects. You may report side effects to FDA at 1-800-FDA-1088. Where should I keep my medication? This medication is given in a hospital or clinic and will not be stored at home. NOTE: This sheet is a summary. It may not cover all possible information. If you have questions about this medicine, talk to your doctor, pharmacist, or health care provider.  2024 Elsevier/Gold Standard (2022-03-18 00:00:00)

## 2023-02-23 ENCOUNTER — Encounter: Payer: Self-pay | Admitting: Oncology

## 2023-02-23 MED FILL — Iron Sucrose Inj 20 MG/ML (Fe Equiv): INTRAVENOUS | Qty: 10 | Status: AC

## 2023-02-24 ENCOUNTER — Inpatient Hospital Stay: Payer: Medicare PPO

## 2023-02-24 VITALS — BP 147/71 | HR 72 | Temp 98.3°F

## 2023-02-24 DIAGNOSIS — D509 Iron deficiency anemia, unspecified: Secondary | ICD-10-CM | POA: Diagnosis not present

## 2023-02-24 MED ORDER — SODIUM CHLORIDE 0.9 % IV SOLN: Freq: Once | INTRAVENOUS | Status: AC

## 2023-02-24 MED ORDER — SODIUM CHLORIDE 0.9 % IV SOLN
200.0000 mg | Freq: Once | INTRAVENOUS | Status: AC
  Administered 2023-02-24: 200 mg via INTRAVENOUS
  Filled 2023-02-24: qty 200

## 2023-02-24 NOTE — Patient Instructions (Signed)

## 2023-02-25 MED FILL — Iron Sucrose Inj 20 MG/ML (Fe Equiv): INTRAVENOUS | Qty: 10 | Status: AC

## 2023-02-26 ENCOUNTER — Inpatient Hospital Stay: Payer: Medicare PPO

## 2023-02-26 VITALS — BP 138/87 | HR 73 | Temp 97.5°F

## 2023-02-26 DIAGNOSIS — D509 Iron deficiency anemia, unspecified: Secondary | ICD-10-CM | POA: Diagnosis not present

## 2023-02-26 MED ORDER — SODIUM CHLORIDE 0.9 % IV SOLN
200.0000 mg | Freq: Once | INTRAVENOUS | Status: AC
  Administered 2023-02-26: 200 mg via INTRAVENOUS
  Filled 2023-02-26: qty 200

## 2023-02-26 MED ORDER — SODIUM CHLORIDE 0.9 % IV SOLN: Freq: Once | INTRAVENOUS | Status: AC

## 2023-02-26 NOTE — Patient Instructions (Signed)

## 2023-03-01 MED FILL — Iron Sucrose Inj 20 MG/ML (Fe Equiv): INTRAVENOUS | Qty: 10 | Status: AC

## 2023-03-02 ENCOUNTER — Inpatient Hospital Stay: Payer: Medicare PPO

## 2023-03-02 VITALS — BP 144/88 | HR 66 | Temp 98.2°F | Resp 14 | Ht 65.0 in | Wt 183.1 lb

## 2023-03-02 DIAGNOSIS — D509 Iron deficiency anemia, unspecified: Secondary | ICD-10-CM | POA: Diagnosis not present

## 2023-03-02 MED ORDER — SODIUM CHLORIDE 0.9 % IV SOLN
200.0000 mg | Freq: Once | INTRAVENOUS | Status: AC
  Administered 2023-03-02: 200 mg via INTRAVENOUS
  Filled 2023-03-02: qty 200

## 2023-03-02 MED ORDER — SODIUM CHLORIDE 0.9 % IV SOLN: Freq: Once | INTRAVENOUS | Status: AC

## 2023-03-02 NOTE — Patient Instructions (Addendum)

## 2023-03-04 ENCOUNTER — Inpatient Hospital Stay: Payer: Medicare PPO

## 2023-03-04 VITALS — BP 155/83 | HR 88 | Temp 97.8°F | Resp 16 | Ht 65.0 in | Wt 183.0 lb

## 2023-03-04 DIAGNOSIS — D509 Iron deficiency anemia, unspecified: Secondary | ICD-10-CM | POA: Diagnosis not present

## 2023-03-04 MED ORDER — SODIUM CHLORIDE 0.9 % IV SOLN
200.0000 mg | Freq: Once | INTRAVENOUS | Status: AC
  Administered 2023-03-04: 200 mg via INTRAVENOUS
  Filled 2023-03-04: qty 200

## 2023-03-04 MED ORDER — SODIUM CHLORIDE 0.9 % IV SOLN: Freq: Once | INTRAVENOUS | Status: AC

## 2023-03-04 NOTE — Progress Notes (Signed)
Patient has large discolored area to left forearm- patient states that area became evident after 1st iron infusion.

## 2023-03-11 ENCOUNTER — Telehealth: Payer: Self-pay

## 2023-03-11 NOTE — Telephone Encounter (Signed)
Called and spoke to patient in regards to previous safety zone placed related to some discoloration in the arm after iron infusion. Patient was very pleasant and reports discoloration remains but is improving every day.Reports slightly larger than fifty cent piece and no redness, warmth or pain. Writer encouraged patient to stop by the clinic if she develops any concerns and ask for a nurse to evaluate. Patient was very appreciative of call and reports no needs or concerns at this time.

## 2023-03-12 ENCOUNTER — Inpatient Hospital Stay: Payer: Medicare PPO

## 2023-03-22 ENCOUNTER — Encounter: Payer: Self-pay | Admitting: Oncology

## 2023-03-23 ENCOUNTER — Inpatient Hospital Stay: Payer: Medicare PPO | Attending: Oncology

## 2023-03-23 VITALS — BP 149/68 | HR 74 | Temp 97.7°F | Resp 18

## 2023-03-23 DIAGNOSIS — E538 Deficiency of other specified B group vitamins: Secondary | ICD-10-CM | POA: Insufficient documentation

## 2023-03-23 DIAGNOSIS — D509 Iron deficiency anemia, unspecified: Secondary | ICD-10-CM | POA: Diagnosis present

## 2023-03-23 MED ORDER — CYANOCOBALAMIN 1000 MCG/ML IJ SOLN
1000.0000 ug | Freq: Once | INTRAMUSCULAR | Status: AC
  Administered 2023-03-23: 1000 ug via INTRAMUSCULAR
  Filled 2023-03-23: qty 1

## 2023-03-23 NOTE — Patient Instructions (Signed)
Vitamin B12 Deficiency Vitamin B12 deficiency occurs when the body does not have enough of this important vitamin. The body needs this vitamin: To make red blood cells. To make DNA. This is the genetic material inside cells. To help the nerves work properly so they can carry messages from the brain to the body. Vitamin B12 deficiency can cause health problems, such as not having enough red blood cells in the blood (anemia). This can lead to nerve damage if untreated. What are the causes? This condition may be caused by: Not eating enough foods that contain vitamin B12. Not having enough stomach acid and digestive fluids to properly absorb vitamin B12 from the food that you eat. Having certain diseases that make it hard to absorb vitamin B12. These diseases include Crohn's disease, chronic pancreatitis, and cystic fibrosis. An autoimmune disorder in which the body does not make enough of a protein (intrinsic factor) within the stomach, resulting in not enough absorption of vitamin B12. Having a surgery in which part of the stomach or small intestine is removed. Taking certain medicines that make it hard for the body to absorb vitamin B12. These include: Heartburn medicines, such as antacids and proton pump inhibitors. Some medicines that are used to treat diabetes. What increases the risk? The following factors may make you more likely to develop a vitamin B12 deficiency: Being an older adult. Eating a vegetarian or vegan diet that does not include any foods that come from animals. Eating a poor diet while you are pregnant. Taking certain medicines. Having alcoholism. What are the signs or symptoms? In some cases, there are no symptoms of this condition. If the condition leads to anemia or nerve damage, various symptoms may occur, such as: Weakness. Tiredness (fatigue). Loss of appetite. Numbness or tingling in your hands and feet. Redness and burning of the tongue. Depression,  confusion, or memory problems. Trouble walking. If anemia is severe, symptoms can include: Shortness of breath. Dizziness. Rapid heart rate. How is this diagnosed? This condition may be diagnosed with a blood test to measure the level of vitamin B12 in your blood. You may also have other tests, including: A group of tests that measure certain characteristics of blood cells (complete blood count, CBC). A blood test to measure intrinsic factor. A procedure where a thin tube with a camera on the end is used to look into your stomach or intestines (endoscopy). Other tests may be needed to discover the cause of the deficiency. How is this treated? Treatment for this condition depends on the cause. This condition may be treated by: Changing your eating and drinking habits, such as: Eating more foods that contain vitamin B12. Drinking less alcohol or no alcohol. Getting vitamin B12 injections. Taking vitamin B12 supplements by mouth (orally). Your health care provider will tell you which dose is best for you. Follow these instructions at home: Eating and drinking  Include foods in your diet that come from animals and contain a lot of vitamin B12. These include: Meats and poultry. This includes beef, pork, chicken, turkey, and organ meats, such as liver. Seafood. This includes clams, rainbow trout, salmon, tuna, and haddock. Eggs. Dairy foods such as milk, yogurt, and cheese. Eat foods that have vitamin B12 added to them (are fortified), such as ready-to-eat breakfast cereals. Check the label on the package to see if a food is fortified. The items listed above may not be a complete list of foods and beverages you can eat and drink. Contact a dietitian for   more information. Alcohol use Do not drink alcohol if: Your health care provider tells you not to drink. You are pregnant, may be pregnant, or are planning to become pregnant. If you drink alcohol: Limit how much you have to: 0-1 drink a  day for women. 0-2 drinks a day for men. Know how much alcohol is in your drink. In the [place] one drink equals one 12 oz bottle of beer (355 mL), one 5 oz glass of wine (148 mL), or one 1 oz glass of hard liquor (44 mL). General instructions Get vitamin B12 injections if told to by your health care provider. Take supplements only as told by your health care provider. Follow the directions carefully. Keep all follow-up visits. This is important. Contact a health care provider if: Your symptoms come back. Your symptoms get worse or do not improve with treatment. Get help right away: You develop shortness of breath. You have a rapid heart rate. You have chest pain. You become dizzy or you faint. These symptoms may be an emergency. Get help right away. Call 911. Do not wait to see if the symptoms will go away. Do not drive yourself to the hospital. Summary Vitamin B12 deficiency occurs when the body does not have enough of this important vitamin. Common causes include not eating enough foods that contain vitamin B12, not being able to absorb vitamin B12 from the food that you eat, having a surgery in which part of the stomach or small intestine is removed, or taking certain medicines. Eat foods that have vitamin B12 in them. Treatment may include making a change in the way you eat and drink, getting vitamin B12 injections, or taking vitamin B12 supplements. This information is not intended to replace advice given to you by your health care provider. Make sure you discuss any questions you have with your health care provider. Document Revised: 05/02/2021 Document Reviewed: 05/02/2021 Elsevier Patient Education  2024 [employer]  

## 2023-04-09 ENCOUNTER — Inpatient Hospital Stay: Payer: Medicare PPO

## 2023-04-20 ENCOUNTER — Ambulatory Visit: Payer: Medicare PPO

## 2023-04-30 ENCOUNTER — Emergency Department (HOSPITAL_COMMUNITY): Payer: Medicare PPO

## 2023-04-30 ENCOUNTER — Inpatient Hospital Stay (HOSPITAL_COMMUNITY)
Admission: EM | Admit: 2023-04-30 | Discharge: 2023-05-03 | DRG: 065 | Disposition: A | Discharge status: Skilled Nursing Facility | Payer: Medicare PPO | Source: Other Acute Inpatient Hospital | Attending: Internal Medicine | Admitting: Internal Medicine

## 2023-04-30 ENCOUNTER — Other Ambulatory Visit: Payer: Self-pay

## 2023-04-30 ENCOUNTER — Encounter (HOSPITAL_COMMUNITY): Payer: Self-pay | Admitting: Internal Medicine

## 2023-04-30 DIAGNOSIS — D751 Secondary polycythemia: Secondary | ICD-10-CM | POA: Diagnosis present

## 2023-04-30 DIAGNOSIS — Z87440 Personal history of urinary (tract) infections: Secondary | ICD-10-CM

## 2023-04-30 DIAGNOSIS — I6381 Other cerebral infarction due to occlusion or stenosis of small artery: Secondary | ICD-10-CM | POA: Diagnosis not present

## 2023-04-30 DIAGNOSIS — Z7982 Long term (current) use of aspirin: Secondary | ICD-10-CM

## 2023-04-30 DIAGNOSIS — I672 Cerebral atherosclerosis: Secondary | ICD-10-CM | POA: Diagnosis present

## 2023-04-30 DIAGNOSIS — R69 Illness, unspecified: Secondary | ICD-10-CM

## 2023-04-30 DIAGNOSIS — Z8249 Family history of ischemic heart disease and other diseases of the circulatory system: Secondary | ICD-10-CM

## 2023-04-30 DIAGNOSIS — Z8673 Personal history of transient ischemic attack (TIA), and cerebral infarction without residual deficits: Secondary | ICD-10-CM | POA: Diagnosis not present

## 2023-04-30 DIAGNOSIS — R4182 Altered mental status, unspecified: Principal | ICD-10-CM

## 2023-04-30 DIAGNOSIS — E559 Vitamin D deficiency, unspecified: Secondary | ICD-10-CM | POA: Diagnosis present

## 2023-04-30 DIAGNOSIS — Z820 Family history of epilepsy and other diseases of the nervous system: Secondary | ICD-10-CM

## 2023-04-30 DIAGNOSIS — E039 Hypothyroidism, unspecified: Secondary | ICD-10-CM | POA: Diagnosis present

## 2023-04-30 DIAGNOSIS — Z7984 Long term (current) use of oral hypoglycemic drugs: Secondary | ICD-10-CM

## 2023-04-30 DIAGNOSIS — R29810 Facial weakness: Secondary | ICD-10-CM | POA: Diagnosis present

## 2023-04-30 DIAGNOSIS — E119 Type 2 diabetes mellitus without complications: Secondary | ICD-10-CM | POA: Diagnosis present

## 2023-04-30 DIAGNOSIS — E0781 Sick-euthyroid syndrome: Secondary | ICD-10-CM | POA: Diagnosis present

## 2023-04-30 DIAGNOSIS — Z79890 Hormone replacement therapy (postmenopausal): Secondary | ICD-10-CM

## 2023-04-30 DIAGNOSIS — Z800 Family history of malignant neoplasm of digestive organs: Secondary | ICD-10-CM

## 2023-04-30 DIAGNOSIS — Z8262 Family history of osteoporosis: Secondary | ICD-10-CM

## 2023-04-30 DIAGNOSIS — R531 Weakness: Secondary | ICD-10-CM

## 2023-04-30 DIAGNOSIS — R5383 Other fatigue: Secondary | ICD-10-CM | POA: Diagnosis not present

## 2023-04-30 DIAGNOSIS — N390 Urinary tract infection, site not specified: Secondary | ICD-10-CM

## 2023-04-30 DIAGNOSIS — Z888 Allergy status to other drugs, medicaments and biological substances status: Secondary | ICD-10-CM

## 2023-04-30 DIAGNOSIS — K219 Gastro-esophageal reflux disease without esophagitis: Secondary | ICD-10-CM | POA: Diagnosis present

## 2023-04-30 DIAGNOSIS — Z7985 Long-term (current) use of injectable non-insulin antidiabetic drugs: Secondary | ICD-10-CM

## 2023-04-30 DIAGNOSIS — Z8041 Family history of malignant neoplasm of ovary: Secondary | ICD-10-CM

## 2023-04-30 DIAGNOSIS — G8194 Hemiplegia, unspecified affecting left nondominant side: Secondary | ICD-10-CM | POA: Diagnosis present

## 2023-04-30 DIAGNOSIS — Z79899 Other long term (current) drug therapy: Secondary | ICD-10-CM

## 2023-04-30 DIAGNOSIS — R471 Dysarthria and anarthria: Secondary | ICD-10-CM | POA: Diagnosis present

## 2023-04-30 DIAGNOSIS — Z833 Family history of diabetes mellitus: Secondary | ICD-10-CM

## 2023-04-30 DIAGNOSIS — R29704 NIHSS score 4: Secondary | ICD-10-CM | POA: Diagnosis present

## 2023-04-30 DIAGNOSIS — E876 Hypokalemia: Secondary | ICD-10-CM | POA: Diagnosis present

## 2023-04-30 DIAGNOSIS — E785 Hyperlipidemia, unspecified: Secondary | ICD-10-CM | POA: Diagnosis present

## 2023-04-30 DIAGNOSIS — G4730 Sleep apnea, unspecified: Secondary | ICD-10-CM | POA: Diagnosis present

## 2023-04-30 DIAGNOSIS — I10 Essential (primary) hypertension: Secondary | ICD-10-CM | POA: Diagnosis present

## 2023-04-30 DIAGNOSIS — G459 Transient cerebral ischemic attack, unspecified: Secondary | ICD-10-CM | POA: Diagnosis present

## 2023-04-30 LAB — I-STAT CHEM 8, ED
BUN: 9 mg/dL (ref 8–23)
Calcium, Ion: 1.25 mmol/L (ref 1.15–1.40)
Chloride: 102 mmol/L (ref 98–111)
Creatinine, Ser: 0.4 mg/dL — ABNORMAL LOW (ref 0.44–1.00)
Glucose, Bld: 120 mg/dL — ABNORMAL HIGH (ref 70–99)
HCT: 48 % — ABNORMAL HIGH (ref 36.0–46.0)
Hemoglobin: 16.3 g/dL — ABNORMAL HIGH (ref 12.0–15.0)
Potassium: 3.5 mmol/L (ref 3.5–5.1)
Sodium: 136 mmol/L (ref 135–145)
TCO2: 21 mmol/L — ABNORMAL LOW (ref 22–32)

## 2023-04-30 LAB — BLOOD GAS, VENOUS
Acid-Base Excess: 1.7 mmol/L (ref 0.0–2.0)
Bicarbonate: 27.7 mmol/L (ref 20.0–28.0)
O2 Saturation: 78.8 %
Patient temperature: 36.8
pCO2, Ven: 48 mmHg (ref 44–60)
pH, Ven: 7.37 (ref 7.25–7.43)
pO2, Ven: 45 mmHg (ref 32–45)

## 2023-04-30 LAB — RAPID URINE DRUG SCREEN, HOSP PERFORMED
Amphetamines: NOT DETECTED
Barbiturates: NOT DETECTED
Benzodiazepines: NOT DETECTED
Cocaine: NOT DETECTED
Opiates: NOT DETECTED
Tetrahydrocannabinol: NOT DETECTED

## 2023-04-30 LAB — URINALYSIS, ROUTINE W REFLEX MICROSCOPIC
Bilirubin Urine: NEGATIVE
Glucose, UA: >=500 mg/dL — AB
Ketones, ur: NEGATIVE mg/dL
Nitrite: NEGATIVE
Protein, ur: NEGATIVE mg/dL
Specific Gravity, Urine: >1.046 — ABNORMAL HIGH (ref 1.005–1.030)
pH: 5 (ref 5.0–8.0)

## 2023-04-30 LAB — CBC
HCT: 49 % — ABNORMAL HIGH (ref 36.0–46.0)
HCT: 40 % (ref 36.0–46.0)
Hemoglobin: 15.9 g/dL — ABNORMAL HIGH (ref 12.0–15.0)
Hemoglobin: 13.2 g/dL (ref 12.0–15.0)
MCH: 31 pg (ref 26.0–34.0)
MCH: 31.9 pg (ref 26.0–34.0)
MCHC: 32.4 g/dL (ref 30.0–36.0)
MCHC: 33 g/dL (ref 30.0–36.0)
MCV: 95.5 fL (ref 80.0–100.0)
MCV: 96.6 fL (ref 80.0–100.0)
Platelets: 269 10*3/uL (ref 150–400)
Platelets: 226 10*3/uL (ref 150–400)
RBC: 5.13 MIL/uL — ABNORMAL HIGH (ref 3.87–5.11)
RBC: 4.14 MIL/uL (ref 3.87–5.11)
RDW: 12.9 % (ref 11.5–15.5)
RDW: 13.1 % (ref 11.5–15.5)
WBC: 12.4 10*3/uL — ABNORMAL HIGH (ref 4.0–10.5)
WBC: 10.2 10*3/uL (ref 4.0–10.5)
nRBC: 0 % (ref 0.0–0.2)
nRBC: 0 % (ref 0.0–0.2)

## 2023-04-30 LAB — PROTIME-INR
INR: 0.9 (ref 0.8–1.2)
Prothrombin Time: 12.7 seconds (ref 11.4–15.2)

## 2023-04-30 LAB — DIFFERENTIAL
Abs Immature Granulocytes: 0.05 10*3/uL (ref 0.00–0.07)
Basophils Absolute: 0 10*3/uL (ref 0.0–0.1)
Basophils Relative: 0 %
Eosinophils Absolute: 0.1 10*3/uL (ref 0.0–0.5)
Eosinophils Relative: 1 %
Immature Granulocytes: 0 %
Lymphocytes Relative: 16 %
Lymphs Abs: 2 10*3/uL (ref 0.7–4.0)
Monocytes Absolute: 0.8 10*3/uL (ref 0.1–1.0)
Monocytes Relative: 6 %
Neutro Abs: 9.5 10*3/uL — ABNORMAL HIGH (ref 1.7–7.7)
Neutrophils Relative %: 77 %

## 2023-04-30 LAB — CULTURE, BLOOD (ROUTINE X 2): Special Requests: ADEQUATE

## 2023-04-30 LAB — COMPREHENSIVE METABOLIC PANEL
ALT: 26 U/L (ref 0–44)
AST: 18 U/L (ref 15–41)
Albumin: 3.4 g/dL — ABNORMAL LOW (ref 3.5–5.0)
Alkaline Phosphatase: 63 U/L (ref 38–126)
Anion gap: 11 (ref 5–15)
BUN: 9 mg/dL (ref 8–23)
CO2: 23 mmol/L (ref 22–32)
Calcium: 9.8 mg/dL (ref 8.9–10.3)
Chloride: 101 mmol/L (ref 98–111)
Creatinine, Ser: 0.62 mg/dL (ref 0.44–1.00)
GFR, Estimated: >60 mL/min (ref >60)
Glucose, Bld: 124 mg/dL — ABNORMAL HIGH (ref 70–99)
Potassium: 3.4 mmol/L — ABNORMAL LOW (ref 3.5–5.1)
Sodium: 135 mmol/L (ref 135–145)
Total Bilirubin: 1.1 mg/dL (ref 0.3–1.2)
Total Protein: 6.1 g/dL — ABNORMAL LOW (ref 6.5–8.1)

## 2023-04-30 LAB — CREATININE, SERUM
Creatinine, Ser: 0.6 mg/dL (ref 0.44–1.00)
GFR, Estimated: >60 mL/min (ref >60)

## 2023-04-30 LAB — APTT: aPTT: 26 seconds (ref 24–36)

## 2023-04-30 LAB — GLUCOSE, CAPILLARY: Glucose-Capillary: 101 mg/dL — ABNORMAL HIGH (ref 70–99)

## 2023-04-30 LAB — TROPONIN I (HIGH SENSITIVITY)
Troponin I (High Sensitivity): 9 ng/L (ref <18)
Troponin I (High Sensitivity): 8 ng/L (ref <18)

## 2023-04-30 LAB — ETHANOL: Alcohol, Ethyl (B): <10 mg/dL (ref <10)

## 2023-04-30 LAB — AMMONIA: Ammonia: 13 umol/L (ref 9–35)

## 2023-04-30 MED ORDER — LEVOTHYROXINE SODIUM 100 MCG PO TABS
100.0000 ug | ORAL_TABLET | Freq: Every day | ORAL | Status: DC
  Administered 2023-05-01 – 2023-05-03 (×3): 100 ug via ORAL
  Filled 2023-04-30 (×3): qty 1

## 2023-04-30 MED ORDER — SIMVASTATIN 20 MG PO TABS
40.0000 mg | ORAL_TABLET | Freq: Every day | ORAL | Status: DC
  Administered 2023-05-01 – 2023-05-02 (×2): 40 mg via ORAL
  Filled 2023-04-30 (×2): qty 2

## 2023-04-30 MED ORDER — EMPAGLIFLOZIN 25 MG PO TABS
25.0000 mg | ORAL_TABLET | Freq: Every day | ORAL | Status: DC
  Administered 2023-05-01 – 2023-05-03 (×3): 25 mg via ORAL
  Filled 2023-04-30 (×3): qty 1

## 2023-04-30 MED ORDER — INSULIN ASPART 100 UNIT/ML IJ SOLN
0.0000 [IU] | Freq: Three times a day (TID) | INTRAMUSCULAR | Status: DC
  Administered 2023-05-02 – 2023-05-03 (×2): 2 [IU] via SUBCUTANEOUS

## 2023-04-30 MED ORDER — EZETIMIBE 10 MG PO TABS
10.0000 mg | ORAL_TABLET | Freq: Every day | ORAL | Status: DC
  Administered 2023-04-30 – 2023-05-02 (×3): 10 mg via ORAL
  Filled 2023-04-30 (×3): qty 1

## 2023-04-30 MED ORDER — ASPIRIN 325 MG PO TABS
325.0000 mg | ORAL_TABLET | Freq: Every day | ORAL | Status: DC
  Administered 2023-04-30: 325 mg via ORAL
  Filled 2023-04-30: qty 1

## 2023-04-30 MED ORDER — SENNOSIDES-DOCUSATE SODIUM 8.6-50 MG PO TABS: 1.0000 | ORAL_TABLET | Freq: Every evening | ORAL | Status: DC | PRN

## 2023-04-30 MED ORDER — STROKE: EARLY STAGES OF RECOVERY BOOK
Freq: Once | Status: AC
  Filled 2023-04-30: qty 1

## 2023-04-30 MED ORDER — LACTATED RINGERS IV BOLUS
1000.0000 mL | Freq: Once | INTRAVENOUS | Status: AC
  Administered 2023-04-30: 1000 mL via INTRAVENOUS

## 2023-04-30 MED ORDER — SODIUM CHLORIDE 0.9 % IV SOLN
1.0000 g | Freq: Once | INTRAVENOUS | Status: AC
  Administered 2023-04-30: 1 g via INTRAVENOUS
  Filled 2023-04-30: qty 10

## 2023-04-30 MED ORDER — SODIUM CHLORIDE 0.9 % IV SOLN: INTRAVENOUS | Status: DC

## 2023-04-30 MED ORDER — ASPIRIN 300 MG RE SUPP: 300.0000 mg | Freq: Every day | RECTAL | Status: DC

## 2023-04-30 MED ORDER — POTASSIUM CHLORIDE 10 MEQ/100ML IV SOLN
10.0000 meq | INTRAVENOUS | Status: AC
  Administered 2023-04-30: 10 meq via INTRAVENOUS
  Filled 2023-04-30: qty 100

## 2023-04-30 MED ORDER — ASPIRIN 81 MG PO TBEC
81.0000 mg | DELAYED_RELEASE_TABLET | Freq: Every day | ORAL | Status: DC
  Administered 2023-05-01 – 2023-05-03 (×3): 81 mg via ORAL
  Filled 2023-04-30 (×3): qty 1

## 2023-04-30 MED ORDER — DULAGLUTIDE 3 MG/0.5ML ~~LOC~~ SOAJ: 3.0000 mg | SUBCUTANEOUS | Status: DC

## 2023-04-30 MED ORDER — ACETAMINOPHEN 325 MG PO TABS
650.0000 mg | ORAL_TABLET | ORAL | Status: DC | PRN
  Administered 2023-04-30 – 2023-05-03 (×2): 650 mg via ORAL
  Filled 2023-04-30 (×2): qty 2

## 2023-04-30 MED ORDER — ENOXAPARIN SODIUM 40 MG/0.4ML IJ SOSY
40.0000 mg | PREFILLED_SYRINGE | INTRAMUSCULAR | Status: DC
  Administered 2023-04-30 – 2023-05-02 (×3): 40 mg via SUBCUTANEOUS
  Filled 2023-04-30 (×3): qty 0.4

## 2023-04-30 MED ORDER — ACETAMINOPHEN 650 MG RE SUPP: 650.0000 mg | RECTAL | Status: DC | PRN

## 2023-04-30 MED ORDER — IPRATROPIUM-ALBUTEROL 0.5-2.5 (3) MG/3ML IN SOLN: 3.0000 mL | Freq: Four times a day (QID) | RESPIRATORY_TRACT | Status: DC | PRN

## 2023-04-30 MED ORDER — IOHEXOL 350 MG/ML SOLN
75.0000 mL | Freq: Once | INTRAVENOUS | Status: AC | PRN
  Administered 2023-04-30: 75 mL via INTRAVENOUS

## 2023-04-30 MED ORDER — SODIUM CHLORIDE 0.9 % IV SOLN
1.0000 g | INTRAVENOUS | Status: DC
  Administered 2023-05-01 – 2023-05-02 (×2): 1 g via INTRAVENOUS
  Filled 2023-04-30 (×2): qty 10

## 2023-04-30 MED ORDER — ACETAMINOPHEN 160 MG/5ML PO SOLN: 650.0000 mg | ORAL | Status: DC | PRN

## 2023-04-30 MED ORDER — INSULIN ASPART 100 UNIT/ML IJ SOLN: 0.0000 [IU] | Freq: Every day | INTRAMUSCULAR | Status: DC
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
## 2023-04-30 NOTE — Assessment & Plan Note (Signed)
Is a rule out diagnosis, I am holding patient's antihypertensive regimen, at this time.  Kindly resume in the morning once we have the MRI report back

## 2023-04-30 NOTE — Assessment & Plan Note (Addendum)
Check TSH and free T4 as well as cortisol in the morning. Routine ABG given letharyg. Amonia.  Compared to the CAT scan in the system from October 12, 2022, there is a new hypodensity in the upper pons and new infarction in the right thalamus.  At this time obviously I cannot tell if this is acute or not.  MRI brain is pending.  However the overall presentation is still felt to be nonfocal and would consider this to be a rule out infarction at this stage.   Patient is taking several medications including gabapentin as well as primidone that are known to cause lethargy/somnolence.  I will hold these at this time till patient's symptoms improved somewhat.  Ever these are not recent medication changes

## 2023-04-30 NOTE — ED Provider Notes (Signed)
[place] EMERGENCY DEPARTMENT AT [facility] Provider Note   CSN: [id number] Arrival date & time: 04/30/23  1424  An emergency department physician performed an initial assessment on this suspected stroke patient at 1425.  History  Chief Complaint  Patient presents with   Code Stroke    Kimberly Barker is a 72 y.o. female.  HPI 72 year old female history of hypertension, hyperlipidemia, hypothyroidism, GERD, diabetes, recent stroke presenting for concern for stroke.  Patient presents from facility due to concern for stroke with left-sided weakness and facial droop.  Reportedly had a stroke in July without lasting deficits.  She is alert and oriented here.  She denies any pain including headache.  She states she always have some mild weakness in her left arm and face feels like it is similar.  She denies any headache, vision changes, chest pain, shortness of breath.  No recent vomiting or diarrhea.  No fevers or other illness.     Home Medications Prior to Admission medications   Medication Sig Start Date End Date Taking? Authorizing Provider  acetaminophen (TYLENOL) 325 MG tablet Take 650 mg by mouth every 4 (four) hours as needed for moderate pain.   Yes [provider]  aspirin EC 81 MG tablet Take 1 tablet (81 mg total) by mouth daily. Swallow whole. 10/14/22 10/14/23 Yes Osvaldo Shipper, MD  azithromycin (ZITHROMAX) 500 MG tablet Take 500 mg by mouth daily.   Yes [provider]  cyanocobalamin (VITAMIN B12) 1000 MCG/ML injection Inject 1,000 mcg into the muscle every 30 (thirty) days.   Yes [provider]  dexamethasone (DECADRON) 4 MG tablet Take 4 mg by mouth daily.   Yes [provider]  dexamethasone (DECADRON) 6 MG tablet Take 6 mg by mouth daily.   Yes [provider]  dextromethorphan-guaiFENesin (MUCINEX DM) 30-600 MG 12hr tablet Take 1 tablet by mouth 2 (two) times daily.   Yes [provider]   empagliflozin (JARDIANCE) 25 MG TABS tablet Take 25 mg by mouth daily. 06/01/17  Yes [provider]  ezetimibe (ZETIA) 10 MG tablet Take 10 mg by mouth at bedtime. 11/30/22  Yes [provider]  gabapentin (NEURONTIN) 300 MG capsule Take by mouth.   Yes [provider]  ipratropium-albuterol (DUONEB) 0.5-2.5 (3) MG/3ML SOLN Take 3 mLs by nebulization every 6 (six) hours as needed (for COVID for 10 days).   Yes [provider]  irbesartan (AVAPRO) 150 MG tablet Take 150 mg by mouth daily. 02/25/21  Yes [provider]  levofloxacin (LEVAQUIN) 750 MG tablet Take 750 mg by mouth daily.   Yes [provider]  levothyroxine (SYNTHROID) 100 MCG tablet Take 100 mcg by mouth daily. 09/24/21  Yes [provider]  Magnesium Hydroxide (MILK OF MAGNESIA PO) Take 30 mLs by mouth daily.   Yes [provider]  metFORMIN (GLUCOPHAGE-XR) 500 MG 24 hr tablet Take 1,000 mg by mouth 2 (two) times daily. 12/29/16  Yes [provider]  primidone (MYSOLINE) 50 MG tablet Take 50 mg by mouth at bedtime. 04/28/23 08/26/23 Yes [provider]  simvastatin (ZOCOR) 40 MG tablet Take 40 mg by mouth daily at 6 PM. 08/14/22  Yes [provider]  TRULICITY 3 MG/0.5ML SOPN Inject 3 mg into the muscle every Wednesday. 02/28/22  Yes [provider]  calcium carbonate (OSCAL) 1500 (600 Ca) MG TABS tablet Take 600 mg by mouth 2 (two) times daily.  Patient not taking: Reported on 04/30/2023  [provider]  cholecalciferol (VITAMIN D3) 25 MCG (1000 UNIT) tablet Take 1,000 Units by mouth in the morning and at bedtime. Patient not taking: Reported on 04/30/2023    [provider]  CYMBALTA 30 MG capsule Take 30 mg by mouth daily. Patient not taking: Reported on 04/30/2023 10/05/22   [provider]  meloxicam (MOBIC) 15 MG tablet Take by mouth. Patient not taking: Reported on 04/30/2023    [provider]   metoprolol tartrate (LOPRESSOR) 25 MG tablet Take 0.5 tablets (12.5 mg total) by mouth 2 (two) times daily. Patient not taking: Reported on 04/30/2023 10/14/22   Osvaldo Shipper, MD      Allergies    Atorvastatin, Other, and Rosuvastatin    Review of Systems   Review of Systems Review of systems completed and notable as per HPI.  ROS otherwise negative.   Physical Exam Updated Vital Signs BP (!) 159/67 (BP Location: Right Arm)   Pulse 72   Temp 98.3 F (36.8 C) (Axillary)   Resp 19   Ht 5\' 5"  (1.651 m)   Wt 80.4 kg   LMP 02/20/2000 (Approximate)   SpO2 95%   BMI 29.50 kg/m  Physical Exam Vitals and nursing note reviewed.  Constitutional:      General: She is not in acute distress.    Appearance: She is well-developed.  HENT:     Head: Normocephalic and atraumatic.     Mouth/Throat:     Mouth: Mucous membranes are dry.  Eyes:     Conjunctiva/sclera: Conjunctivae normal.  Cardiovascular:     Rate and Rhythm: Normal rate and regular rhythm.     Heart sounds: No murmur heard. Pulmonary:     Effort: Pulmonary effort is normal. No respiratory distress.     Breath sounds: Normal breath sounds.  Abdominal:     Palpations: Abdomen is soft.     Tenderness: There is no abdominal tenderness. There is no guarding or rebound.  Musculoskeletal:        General: No swelling.     Cervical back: Neck supple.  Skin:    General: Skin is warm and dry.     Capillary Refill: Capillary refill takes less than 2 seconds.  Neurological:     Mental Status: She is alert.     Comments: Awake and alert.  No visual field cuts.  Extraocular movements intact.  She has slight facial droop on the left compared to the right.  Normal facial sensation.  She has slight drift in the left upper extremity and 3-5 strength at the shoulder, elbow, wrist.  Full strength in the right upper and bilateral lower extremities.  Sensation intact in all extremities.  Psychiatric:        Mood and Affect: Mood normal.      ED Results / Procedures / Treatments   Labs (all labs ordered are listed, but only abnormal results are displayed) Labs Reviewed  CBC - Abnormal; Notable for the following components:      Result Value   WBC 12.4 (*)    RBC 5.13 (*)    Hemoglobin 15.9 (*)    HCT 49.0 (*)    All other components within normal limits  DIFFERENTIAL - Abnormal; Notable for the following components:   Neutro Abs 9.5 (*)    All other components within normal limits  COMPREHENSIVE METABOLIC PANEL - Abnormal; Notable for the following components:   Potassium 3.4 (*)    Glucose, Bld 124 (*)  Total Protein 6.1 (*)    Albumin 3.4 (*)    All other components within normal limits  URINALYSIS, ROUTINE W REFLEX MICROSCOPIC - Abnormal; Notable for the following components:   Color, Urine STRAW (*)    Specific Gravity, Urine >1.046 (*)    Glucose, UA >=500 (*)    Hgb urine dipstick MODERATE (*)    Leukocytes,Ua MODERATE (*)    Bacteria, UA FEW (*)    All other components within normal limits  LIPID PANEL - Abnormal; Notable for the following components:   HDL 32 (*)    All other components within normal limits  HEMOGLOBIN A1C - Abnormal; Notable for the following components:   Hgb A1c MFr Bld 6.4 (*)    All other components within normal limits  TSH - Abnormal; Notable for the following components:   TSH 4.993 (*)    All other components within normal limits  T4, FREE - Abnormal; Notable for the following components:   Free T4 1.50 (*)    All other components within normal limits  GLUCOSE, CAPILLARY - Abnormal; Notable for the following components:   Glucose-Capillary 101 (*)    All other components within normal limits  I-STAT CHEM 8, ED - Abnormal; Notable for the following components:   Creatinine, Ser 0.40 (*)    Glucose, Bld 120 (*)    TCO2 21 (*)    Hemoglobin 16.3 (*)    HCT 48.0 (*)    All other components within normal limits  CULTURE, BLOOD (ROUTINE X 2)  URINE CULTURE  CULTURE,  BLOOD (ROUTINE X 2)  ETHANOL  PROTIME-INR  APTT  RAPID URINE DRUG SCREEN, HOSP PERFORMED  CBC  CREATININE, SERUM  CORTISOL  AMMONIA  BLOOD GAS, VENOUS  GLUCOSE, CAPILLARY  TROPONIN I (HIGH SENSITIVITY)  TROPONIN I (HIGH SENSITIVITY)    EKG EKG Interpretation Date/Time:  Friday April 30 2023 14:53:53 EDT Ventricular Rate:  88 PR Interval:  163 QRS Duration:  101 QT Interval:  330 QTC Calculation: 400 R Axis:   29  Text Interpretation: Sinus rhythm Ventricular premature complex Probable LVH with secondary repol abnrm Inferior infarct, old Confirmed by Fulton Reek [phone]) on 04/30/2023 3:29:13 PM  Radiology MR BRAIN WO CONTRAST  Result Date: 04/30/2023 CLINICAL DATA:  Acute neurologic deficit EXAM: MRI HEAD WITHOUT CONTRAST TECHNIQUE: Multiplanar, multiecho pulse sequences of the brain and surrounding structures were obtained without intravenous contrast. COMPARISON:  None Available. FINDINGS: Brain: There is bilateral abnormal diffusion restriction of the globi pallidi. There are old right pontine and right centrum semiovale infarcts. No acute or chronic hemorrhage. There is multifocal hyperintense T2-weighted signal within the white matter. Generalized volume loss. A partially empty sella is incidentally noted. Vascular: Major flow voids are preserved. Skull and upper cervical spine: Normal calvarium and skull base. Visualized upper cervical spine and soft tissues are normal. Sinuses/Orbits:No paranasal sinus fluid levels or advanced mucosal thickening. No mastoid or middle ear effusion. Normal orbits. IMPRESSION: 1. Bilateral abnormal diffusion restriction within the globi pallidi. While these may be acute small vessel infarcts, the bilaterality suggests other possibilities such as toxin exposure or drug use. 2. Old right pontine and right centrum semiovale infarcts. Electronically Signed   By: Deatra Robinson M.D.   On: 04/30/2023 21:19   DG CHEST PORT 1 VIEW  Result Date:  04/30/2023 CLINICAL DATA:  Altered mental status EXAM: PORTABLE CHEST 1 VIEW COMPARISON:  None Available. FINDINGS: Transverse diameter of heart is slightly increased. There are no signs of  pulmonary edema or focal pulmonary consolidation. There is no pleural effusion or pneumothorax. IMPRESSION: No active cardiopulmonary disease. Electronically Signed   By: Ernie Avena M.D.   On: 04/30/2023 16:35   CT ANGIO HEAD NECK W WO CM (CODE STROKE)  Result Date: 04/30/2023 CLINICAL DATA:  Neuro deficit, acute, stroke suspected. EXAM: CT ANGIOGRAPHY HEAD AND NECK WITH AND WITHOUT CONTRAST TECHNIQUE: Multidetector CT imaging of the head and neck was performed using the standard protocol during bolus administration of intravenous contrast. Multiplanar CT image reconstructions and MIPs were obtained to evaluate the vascular anatomy. Carotid stenosis measurements (when applicable) are obtained utilizing NASCET criteria, using the distal internal carotid diameter as the denominator. RADIATION DOSE REDUCTION: This exam was performed according to the departmental dose-optimization program which includes automated exposure control, adjustment of the mA and/or kV according to patient size and/or use of iterative reconstruction technique. CONTRAST:  75mL OMNIPAQUE IOHEXOL 350 MG/ML SOLN COMPARISON:  CT head without contrast 04/30/2023. MR head without contrast 10/13/2022. FINDINGS: CTA NECK FINDINGS Aortic arch: A 3 vessel arch configuration is present. Atherosclerotic calcifications are present at the aortic arch and at the origin the left subclavian artery without significant stenosis. Right carotid system: The right common carotid artery within normal limits. Calcified and noncalcified plaque is present at the for carotid bifurcation. The minimal transverse diameter is 2 mm. This compares with a more normal distal right ICA of 4 mm. No tandem stenoses are present. Left carotid system: The left common carotid artery is  within normal limits apart from minimal mural calcification just proximal to the bifurcation. Atherosclerotic changes are present at the carotid bifurcation and proximal left ICA without significant stenosis relative to the more distal vessel. Mural calcifications are noted along the cervical left ICA without focal stenosis. Vertebral arteries: The left vertebral artery is the dominant vessel. Dense calcifications are present at the origin of the left vertebral artery without significant stenosis. The right vertebral artery is hypoplastic. No significant stenosis is present in either vertebral artery in the neck. Skeleton: The vertebral body heights and alignment are normal. Mild rightward curvature is present in the cervical spine. No focal osseous lesions are present. Other neck: Soft tissues the neck are otherwise unremarkable. Salivary glands are within normal limits. Thyroid is normal. No significant adenopathy is present. No focal mucosal or submucosal lesions are present Upper chest: The lung apices are clear. The thoracic inlet is within normal limits. Review of the MIP images confirms the above findings CTA HEAD FINDINGS Anterior circulation: Atherosclerotic calcifications are present within the cavernous internal carotid arteries bilaterally without a significant stenosis relative to the more distal vessel. The ICA termini are normal bilaterally. The A1 and M1 segments are normal. No significant anterior communicating artery is present. The MCA bifurcations are within normal limits bilaterally. Segmental narrowing is present within the distal ACA and MCA branch vessels. The most significant stenosis is a high-grade stenosis of a posterosuperior left M3 branch. Contrast enhancement is present distal to the stenosis. Posterior circulation: Extensive atherosclerotic changes are present at the dural margin of the left vertebral artery. High-grade stenosis is present in the proximal right V4 segment. The left  PICA origin is visualized and normal. The vertebrobasilar junction is normal. Basilar artery is normal. The right superior cerebellar artery is duplicated. The superior cerebellar arteries are patent bilaterally. The proximal PCA vessels are within normal limits bilaterally. There is some segmental narrowing of distal branches. Venous sinuses: The dural sinuses are patent. The straight sinus  and deep cerebral veins are intact. Cortical veins are within normal limits. No significant vascular malformation is evident. Anatomic variants: None Review of the MIP images confirms the above findings IMPRESSION: 1. No emergent large vessel occlusion. 2. High-grade stenosis of a posterosuperior left M3 branch vessel. 3. Distal vessel irregularity within the Circle of Willis consistent with intracranial atherosclerosis. 4. Extensive atherosclerotic changes at the dural margin of the left vertebral artery. 5. High-grade stenosis of the proximal right V4 segment. 6. Atherosclerotic changes at the carotid bifurcations and cavernous internal carotid arteries bilaterally without significant stenosis relative to the more distal vessels. 7.  Aortic Atherosclerosis (ICD10-I70.0). Electronically Signed   By: Marin Roberts M.D.   On: 04/30/2023 14:59   CT HEAD CODE STROKE WO CONTRAST  Result Date: 04/30/2023 CLINICAL DATA:  Code stroke.  Neuro deficit, acute, stroke suspected EXAM: CT HEAD WITHOUT CONTRAST TECHNIQUE: Contiguous axial images were obtained from the base of the skull through the vertex without intravenous contrast. RADIATION DOSE REDUCTION: This exam was performed according to the departmental dose-optimization program which includes automated exposure control, adjustment of the mA and/or kV according to patient size and/or use of iterative reconstruction technique. COMPARISON:  CT head 10/12/22 FINDINGS: Brain: Sequela of moderate chronic microvascular ischemic change with chronic infarcts in the bilateral corona  radiata and basal ganglia. Compared to prior exam there is a new infarct in the right thalamus. There is also a new hypodensity in the upper pons, which may be artifactual. No hemorrhage. No hydrocephalus. No extra-axial fluid collection. Generalized volume loss. Vascular: No hyperdense vessel or unexpected calcification. Skull: Normal. Negative for fracture or focal lesion. Sinuses/Orbits: No middle ear or mastoid effusion. Paranasal sinuses are clear. Orbits are unremarkable. Other: None. ASPECTS [facility] Stroke Program Early CT Score): 10 when accounting for chronic findings IMPRESSION: 1. Compared to prior exam there is a new infarct in the right thalamus. There is also a new hypodensity in the upper pons, which may be artifactual. If there is concern for acute infarct, recommend MRI for further evaluation. 2. Sequela of moderate chronic microvascular ischemic change with chronic infarcts in the bilateral corona radiata and basal ganglia. Findings were paged to Dr. Roda Shutters on 04/30/23 at 2:50 PM via [facility] paging system. Electronically Signed   By: Lorenza Cambridge M.D.   On: 04/30/2023 14:51    Procedures Procedures    Medications Ordered in ED Medications   stroke: early stages of recovery book (has no administration in time range)  0.9 %  sodium chloride infusion ( Intravenous Infusion Verify 05/01/23 0306)  acetaminophen (TYLENOL) tablet 650 mg (650 mg Oral Given 04/30/23 2322)    Or  acetaminophen (TYLENOL) 160 MG/5ML solution 650 mg ( Per Tube See Alternative 04/30/23 2322)    Or  acetaminophen (TYLENOL) suppository 650 mg ( Rectal See Alternative 04/30/23 2322)  senna-docusate (Senokot-S) tablet 1 tablet (has no administration in time range)  enoxaparin (LOVENOX) injection 40 mg (40 mg Subcutaneous Given 04/30/23 2232)  aspirin suppository 300 mg ( Rectal See Alternative 04/30/23 2108)    Or  aspirin tablet 325 mg (325 mg Oral Given 04/30/23 2108)  cefTRIAXone (ROCEPHIN) 1 g in sodium chloride 0.9 % 100 mL  IVPB (has no administration in time range)  potassium chloride 10 mEq in 100 mL IVPB (10 mEq Intravenous Not Given 04/30/23 2230)  insulin aspart (novoLOG) injection 0-15 Units ( Subcutaneous Not Given 05/01/23 0644)  insulin aspart (novoLOG) injection 0-5 Units ( Subcutaneous Not Given 04/30/23 2308)  aspirin EC tablet 81 mg (has no administration in time range)  empagliflozin (JARDIANCE) tablet 25 mg (has no administration in time range)  ezetimibe (ZETIA) tablet 10 mg (10 mg Oral Given 04/30/23 2323)  ipratropium-albuterol (DUONEB) 0.5-2.5 (3) MG/3ML nebulizer solution 3 mL (has no administration in time range)  levothyroxine (SYNTHROID) tablet 100 mcg (100 mcg Oral Given 05/01/23 0532)  simvastatin (ZOCOR) tablet 40 mg (has no administration in time range)  iohexol (OMNIPAQUE) 350 MG/ML injection 75 mL (75 mLs Intravenous Contrast Given 04/30/23 1444)  lactated ringers bolus 1,000 mL (0 mLs Intravenous Stopped 04/30/23 1930)  cefTRIAXone (ROCEPHIN) 1 g in sodium chloride 0.9 % 100 mL IVPB (0 g Intravenous Stopped 04/30/23 2019)    ED Course/ Medical Decision Making/ A&P                                 Medical Decision Making Amount and/or Complexity of Data Reviewed Labs: ordered. Radiology: ordered.  Risk Decision regarding hospitalization.   Medical Decision Making:   Kimberly Barker is a 72 y.o. female who presented to the ED today with concern for stroke.  Patient evaluated as code stroke.  Vital signs reviewed.  On exam she has drift in left lower extremity as well as some left-sided facial droop.  Patient was evaluated by outpatient neurology August 7.  At that time she had slight delayed activation of the left nasolabial fold although no gross droop, as well as weakness in the left upper extremity.  Unclear if her deficits today are truly worse than prior.  She does appear somewhat dry as well and has had recent UTI.  Will obtain metabolic workup as well as stroke  workup.   Patient placed on continuous vitals and telemetry monitoring while in ED which was reviewed periodically.  Reviewed and confirmed nursing documentation for past medical history, family history, social history.  Initial Study Results:   Laboratory  All laboratory results reviewed.  Labs notable for CBC, CMP unremarkable.  EKG EKG was reviewed independently.    Consults: Case discussed with neurology.   Reassessment and Plan:   Handoff given to Dr. Donnald Garre at 1530 will plan to follow-up neurology recommendations and workup.   Patient's presentation is most consistent with acute presentation with potential threat to life or bodily function.           Final Clinical Impression(s) / ED Diagnoses Final diagnoses:  Altered mental status, unspecified altered mental status type  Severe comorbid illness    Rx / DC Orders ED Discharge Orders     None         Laurence Spates, MD 05/01/23 [id number]

## 2023-04-30 NOTE — Code Documentation (Signed)
Stroke Response Nurse Documentation Code Documentation  Alysha Draeger is a 72 y.o. female arriving to [facility]  via [facility] on 04/30/2023 recently seen at [facility] in July 2024 for stroke with left sided weakness. Additional significant past medical hx of hyperlipidemia, tremors, diabetes, hypertension, sleep apnea, and hypothyroidism. On aspirin 81 mg daily and clopidogrel 75 mg daily. Code stroke was activated by EMS.   Patient from [facility] where she was LKW at 1000 and family noted left weakness, facial droop and slurred speech.   Stroke team at the bedside on patient arrival. Labs drawn and patient cleared for CT by EDP. Patient to CT with team. NIHSS 4, see documentation for details and code stroke times. Patient with disoriented, left facial droop, left arm weakness, and left leg weakness on exam. The following imaging was completed:  CT Head and CTA. Patient is not a candidate for IV Thrombolytic due to out of window. Patient is not a candidate for IR due to no LVO.   Care Plan: Q2 VS and NIHSS, stroke workup.   Bedside handoff with ED RN Leeroy Bock.    Ferman Hamming Stroke Response RN

## 2023-04-30 NOTE — ED Provider Notes (Signed)
From SNF. Neuro has seen. Pending labs and w/u. May be acute illness exacerbating stroke sx. F/u neuro recs and diagnostics. Physical Exam  BP (!) 146/83 (BP Location: Right Arm)   Pulse 89   Resp (!) 24   Ht 5\' 5"  (1.651 m)   Wt 80.4 kg   LMP 02/20/2000 (Approximate)   BMI 29.50 kg/m   Physical Exam  Procedures  Procedures  ED Course / MDM    Medical Decision Making Amount and/or Complexity of Data Reviewed Labs: ordered. Radiology: ordered.  Risk Decision regarding hospitalization.   Neurology has assessed.  At this time patient does not have LVO criteria and no clear acute stroke.  Recommendations for admission, MRI and continue metabolic workup.  Review of urine is suggestive of UTI.  Patient reports she has had urinary frequency.  She denies burning does note urgency.  She also endorses prior history of UTIs.  At this time with differential diagnosis including infectious\metabolic source will opt to treat with Rocephin.  Consult: [facility] hospitalist for admission Dr. Maryjean Ka.       Arby Barrette, MD 04/30/23 2000

## 2023-04-30 NOTE — Assessment & Plan Note (Addendum)
This has been previously documented in the chart, appears to be chronic based on review of prior records.  At this time I am not sure as to the etiology of patient's erythrocytosis, I will not repeat workup for erythrocytosis at this time as it seems that this is being looked at as an outpatient.  Better to get outpatient record first before investigating this at this time.
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
## 2023-04-30 NOTE — Assessment & Plan Note (Signed)
No localizing symptoms reported by patient, however patient has leukocytosis, lethargy.  Urine culture has been ordered by ER attending.  I will go ahead and order blood cultures as well c.w. ceftriaxone

## 2023-04-30 NOTE — Plan of Care (Signed)
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
## 2023-04-30 NOTE — ED Triage Notes (Signed)
Pt BIB EMS from [facility] after staff noticed left sided weakness and a facial droop. Pt has hx of stroke previously in July with no lasting deficits. Aox4 on arrival.

## 2023-04-30 NOTE — ED Notes (Signed)
Patient transported to MRI 

## 2023-04-30 NOTE — Plan of Care (Signed)
Problem: Education: Goal: Knowledge of disease or condition will improve 04/30/2023 2330 by Velta Addison, RN Outcome: Progressing 04/30/2023 2311 by Velta Addison, RN Outcome: Progressing Goal: Knowledge of secondary prevention will improve (MUST DOCUMENT ALL) 04/30/2023 2330 by Velta Addison, RN Outcome: Progressing 04/30/2023 2311 by Velta Addison, RN Outcome: Progressing Goal: Knowledge of patient specific risk factors will improve Loraine Leriche N/A or DELETE if not current risk factor) 04/30/2023 2330 by Velta Addison, RN Outcome: Progressing 04/30/2023 2311 by Velta Addison, RN Outcome: Progressing   Problem: Ischemic Stroke/TIA Tissue Perfusion: Goal: Complications of ischemic stroke/TIA will be minimized 04/30/2023 2330 by Velta Addison, RN Outcome: Progressing 04/30/2023 2311 by Velta Addison, RN Outcome: Progressing   Problem: Coping: Goal: Will verbalize positive feelings about self 04/30/2023 2330 by Velta Addison, RN Outcome: Progressing 04/30/2023 2311 by Velta Addison, RN Outcome: Progressing Goal: Will identify appropriate support needs 04/30/2023 2330 by Velta Addison, RN Outcome: Progressing 04/30/2023 2311 by Velta Addison, RN Outcome: Progressing   Problem: Health Behavior/Discharge Planning: Goal: Ability to manage health-related needs will improve 04/30/2023 2330 by Velta Addison, RN Outcome: Progressing 04/30/2023 2311 by Velta Addison, RN Outcome: Progressing Goal: Goals will be collaboratively established with patient/family 04/30/2023 2330 by Velta Addison, RN Outcome: Progressing 04/30/2023 2311 by Velta Addison, RN Outcome: Progressing   Problem: Self-Care: Goal: Ability to participate in self-care as condition permits will improve 04/30/2023 2330 by Velta Addison, RN Outcome: Progressing 04/30/2023 2311 by Velta Addison, RN Outcome: Progressing Goal: Verbalization of feelings and concerns over difficulty  with self-care will improve 04/30/2023 2330 by Velta Addison, RN Outcome: Progressing 04/30/2023 2311 by Velta Addison, RN Outcome: Progressing Goal: Ability to communicate needs accurately will improve 04/30/2023 2330 by Velta Addison, RN Outcome: Progressing 04/30/2023 2311 by Velta Addison, RN Outcome: Progressing   Problem: Nutrition: Goal: Risk of aspiration will decrease 04/30/2023 2330 by Velta Addison, RN Outcome: Progressing 04/30/2023 2311 by Velta Addison, RN Outcome: Progressing Goal: Dietary intake will improve 04/30/2023 2330 by Velta Addison, RN Outcome: Progressing 04/30/2023 2311 by Velta Addison, RN Outcome: Progressing   Problem: Education: Goal: Ability to describe self-care measures that may prevent or decrease complications (Diabetes Survival Skills Education) will improve 04/30/2023 2330 by Velta Addison, RN Outcome: Progressing 04/30/2023 2311 by Velta Addison, RN Outcome: Progressing Goal: Individualized Educational Video(s) 04/30/2023 2330 by Velta Addison, RN Outcome: Progressing 04/30/2023 2311 by Velta Addison, RN Outcome: Progressing   Problem: Coping: Goal: Ability to adjust to condition or change in health will improve 04/30/2023 2330 by Velta Addison, RN Outcome: Progressing 04/30/2023 2311 by Velta Addison, RN Outcome: Progressing   Problem: Fluid Volume: Goal: Ability to maintain a balanced intake and output will improve 04/30/2023 2330 by Velta Addison, RN Outcome: Progressing 04/30/2023 2311 by Velta Addison, RN Outcome: Progressing   Problem: Health Behavior/Discharge Planning: Goal: Ability to identify and utilize available resources and services will improve 04/30/2023 2330 by Velta Addison, RN Outcome: Progressing 04/30/2023 2311 by Velta Addison, RN Outcome: Progressing Goal: Ability to manage health-related needs will improve 04/30/2023 2330 by Velta Addison, RN Outcome:  Progressing 04/30/2023 2311 by Velta Addison, RN Outcome: Progressing   Problem: Metabolic: Goal: Ability to maintain appropriate glucose levels will improve 04/30/2023 2330 by Velta Addison, RN Outcome: Progressing 04/30/2023 2311 by Velta Addison, RN Outcome:  Progressing   Problem: Nutritional: Goal: Maintenance of adequate nutrition will improve 04/30/2023 2330 by Velta Addison, RN Outcome: Progressing 04/30/2023 2311 by Velta Addison, RN Outcome: Progressing Goal: Progress toward achieving an optimal weight will improve 04/30/2023 2330 by Velta Addison, RN Outcome: Progressing 04/30/2023 2311 by Velta Addison, RN Outcome: Progressing   Problem: Skin Integrity: Goal: Risk for impaired skin integrity will decrease 04/30/2023 2330 by Velta Addison, RN Outcome: Progressing 04/30/2023 2311 by Velta Addison, RN Outcome: Progressing   Problem: Tissue Perfusion: Goal: Adequacy of tissue perfusion will improve 04/30/2023 2330 by Velta Addison, RN Outcome: Progressing 04/30/2023 2311 by Velta Addison, RN Outcome: Progressing   Problem: Education: Goal: Knowledge of General Education information will improve Description: Including pain rating scale, medication(s)/side effects and non-pharmacologic comfort measures 04/30/2023 2330 by Velta Addison, RN Outcome: Progressing 04/30/2023 2311 by Velta Addison, RN Outcome: Progressing   Problem: Health Behavior/Discharge Planning: Goal: Ability to manage health-related needs will improve 04/30/2023 2330 by Velta Addison, RN Outcome: Progressing 04/30/2023 2311 by Velta Addison, RN Outcome: Progressing   Problem: Clinical Measurements: Goal: Ability to maintain clinical measurements within normal limits will improve 04/30/2023 2330 by Velta Addison, RN Outcome: Progressing 04/30/2023 2311 by Velta Addison, RN Outcome: Progressing Goal: Will remain free from infection 04/30/2023 2330 by  Velta Addison, RN Outcome: Progressing 04/30/2023 2311 by Velta Addison, RN Outcome: Progressing Goal: Diagnostic test results will improve 04/30/2023 2330 by Velta Addison, RN Outcome: Progressing 04/30/2023 2311 by Velta Addison, RN Outcome: Progressing Goal: Respiratory complications will improve 04/30/2023 2330 by Velta Addison, RN Outcome: Progressing 04/30/2023 2311 by Velta Addison, RN Outcome: Progressing Goal: Cardiovascular complication will be avoided 04/30/2023 2330 by Velta Addison, RN Outcome: Progressing 04/30/2023 2311 by Velta Addison, RN Outcome: Progressing   Problem: Activity: Goal: Risk for activity intolerance will decrease 04/30/2023 2330 by Velta Addison, RN Outcome: Progressing 04/30/2023 2311 by Velta Addison, RN Outcome: Progressing   Problem: Nutrition: Goal: Adequate nutrition will be maintained 04/30/2023 2330 by Velta Addison, RN Outcome: Progressing 04/30/2023 2311 by Velta Addison, RN Outcome: Progressing   Problem: Coping: Goal: Level of anxiety will decrease 04/30/2023 2330 by Velta Addison, RN Outcome: Progressing 04/30/2023 2311 by Velta Addison, RN Outcome: Progressing   Problem: Elimination: Goal: Will not experience complications related to bowel motility 04/30/2023 2330 by Velta Addison, RN Outcome: Progressing 04/30/2023 2311 by Velta Addison, RN Outcome: Progressing Goal: Will not experience complications related to urinary retention 04/30/2023 2330 by Velta Addison, RN Outcome: Progressing 04/30/2023 2311 by Velta Addison, RN Outcome: Progressing   Problem: Pain Managment: Goal: General experience of comfort will improve 04/30/2023 2330 by Velta Addison, RN Outcome: Progressing 04/30/2023 2311 by Velta Addison, RN Outcome: Progressing   Problem: Safety: Goal: Ability to remain free from injury will improve 04/30/2023 2330 by Velta Addison, RN Outcome:  Progressing 04/30/2023 2311 by Velta Addison, RN Outcome: Progressing   Problem: Skin Integrity: Goal: Risk for impaired skin integrity will decrease 04/30/2023 2330 by Velta Addison, RN Outcome: Progressing 04/30/2023 2311 by Velta Addison, RN Outcome: Progressing

## 2023-05-01 ENCOUNTER — Observation Stay (HOSPITAL_COMMUNITY): Payer: Medicare PPO

## 2023-05-01 DIAGNOSIS — I6389 Other cerebral infarction: Secondary | ICD-10-CM

## 2023-05-01 DIAGNOSIS — Z79899 Other long term (current) drug therapy: Secondary | ICD-10-CM | POA: Diagnosis not present

## 2023-05-01 DIAGNOSIS — Z7984 Long term (current) use of oral hypoglycemic drugs: Secondary | ICD-10-CM | POA: Diagnosis not present

## 2023-05-01 DIAGNOSIS — Z888 Allergy status to other drugs, medicaments and biological substances status: Secondary | ICD-10-CM | POA: Diagnosis not present

## 2023-05-01 DIAGNOSIS — G8194 Hemiplegia, unspecified affecting left nondominant side: Secondary | ICD-10-CM | POA: Diagnosis present

## 2023-05-01 DIAGNOSIS — R29704 NIHSS score 4: Secondary | ICD-10-CM | POA: Diagnosis present

## 2023-05-01 DIAGNOSIS — G4730 Sleep apnea, unspecified: Secondary | ICD-10-CM | POA: Diagnosis present

## 2023-05-01 DIAGNOSIS — E876 Hypokalemia: Secondary | ICD-10-CM | POA: Diagnosis present

## 2023-05-01 DIAGNOSIS — Z8673 Personal history of transient ischemic attack (TIA), and cerebral infarction without residual deficits: Secondary | ICD-10-CM | POA: Diagnosis not present

## 2023-05-01 DIAGNOSIS — R471 Dysarthria and anarthria: Secondary | ICD-10-CM | POA: Diagnosis present

## 2023-05-01 DIAGNOSIS — Z7985 Long-term (current) use of injectable non-insulin antidiabetic drugs: Secondary | ICD-10-CM | POA: Diagnosis not present

## 2023-05-01 DIAGNOSIS — Z7982 Long term (current) use of aspirin: Secondary | ICD-10-CM | POA: Diagnosis not present

## 2023-05-01 DIAGNOSIS — E039 Hypothyroidism, unspecified: Secondary | ICD-10-CM | POA: Diagnosis present

## 2023-05-01 DIAGNOSIS — R531 Weakness: Secondary | ICD-10-CM | POA: Diagnosis present

## 2023-05-01 DIAGNOSIS — K219 Gastro-esophageal reflux disease without esophagitis: Secondary | ICD-10-CM | POA: Diagnosis present

## 2023-05-01 DIAGNOSIS — E0781 Sick-euthyroid syndrome: Secondary | ICD-10-CM | POA: Diagnosis present

## 2023-05-01 DIAGNOSIS — E785 Hyperlipidemia, unspecified: Secondary | ICD-10-CM | POA: Diagnosis present

## 2023-05-01 DIAGNOSIS — D751 Secondary polycythemia: Secondary | ICD-10-CM | POA: Diagnosis present

## 2023-05-01 DIAGNOSIS — I6381 Other cerebral infarction due to occlusion or stenosis of small artery: Secondary | ICD-10-CM | POA: Diagnosis present

## 2023-05-01 DIAGNOSIS — Z87440 Personal history of urinary (tract) infections: Secondary | ICD-10-CM | POA: Diagnosis not present

## 2023-05-01 DIAGNOSIS — R29810 Facial weakness: Secondary | ICD-10-CM | POA: Diagnosis present

## 2023-05-01 DIAGNOSIS — I10 Essential (primary) hypertension: Secondary | ICD-10-CM | POA: Diagnosis present

## 2023-05-01 DIAGNOSIS — Z79890 Hormone replacement therapy (postmenopausal): Secondary | ICD-10-CM | POA: Diagnosis not present

## 2023-05-01 DIAGNOSIS — R5383 Other fatigue: Secondary | ICD-10-CM | POA: Diagnosis not present

## 2023-05-01 DIAGNOSIS — I672 Cerebral atherosclerosis: Secondary | ICD-10-CM | POA: Diagnosis present

## 2023-05-01 DIAGNOSIS — E119 Type 2 diabetes mellitus without complications: Secondary | ICD-10-CM | POA: Diagnosis present

## 2023-05-01 DIAGNOSIS — E559 Vitamin D deficiency, unspecified: Secondary | ICD-10-CM | POA: Diagnosis present

## 2023-05-01 LAB — GLUCOSE, CAPILLARY
Glucose-Capillary: 99 mg/dL (ref 70–99)
Glucose-Capillary: 128 mg/dL — ABNORMAL HIGH (ref 70–99)
Glucose-Capillary: 89 mg/dL (ref 70–99)
Glucose-Capillary: 109 mg/dL — ABNORMAL HIGH (ref 70–99)

## 2023-05-01 MED ORDER — TICAGRELOR 90 MG PO TABS
90.0000 mg | ORAL_TABLET | Freq: Two times a day (BID) | ORAL | Status: DC
  Administered 2023-05-01 – 2023-05-03 (×5): 90 mg via ORAL
  Filled 2023-05-01 (×6): qty 1

## 2023-05-01 NOTE — Plan of Care (Signed)

## 2023-05-01 NOTE — Progress Notes (Addendum)
STROKE TEAM PROGRESS NOTE   BRIEF HPI Ms. Kimberly Barker is a 72 y.o. 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 Per EMS, pt had left sided weakness and left facial droop from last stroke in July, discharged to rehab in [place]. She recovered well, and EMS stated that pt no deficit at baseline. LSW 10am, and in the afternoon she was found to have left sided weakness and left facial droop.    SIGNIFICANT HOSPITAL EVENTS MRI brain with acute bilateral basal ganglia infarcts  INTERIM HISTORY/SUBJECTIVE Her family is at the bedside.  Patient is sitting up in the chair in no apparent distress. Per family over the last week she has had functional decline where she was becoming slower and weaker.  Yesterday it was noted that she was unable to transfer from the bed to the chair and had a very flat affect and had some trouble speaking and delayed speech  On exam she has some left side hemiparesis left facial droop with some dysarthria and decreased fine motor skills on the left.  Will place patient on aspirin 81 mg and Brilinta 90 mg twice daily for 1 month and then aspirin alone for monotherapy  OBJECTIVE  CBC    Component Value Date/Time   WBC 10.2 04/30/2023 2215   RBC 4.14 04/30/2023 2215   HGB 13.2 04/30/2023 2215   HGB 15.1 (H) 02/09/2023 1109   HCT 40.0 04/30/2023 2215   PLT 226 04/30/2023 2215   PLT 308 02/09/2023 1109   MCV 96.6 04/30/2023 2215   MCH 31.9 04/30/2023 2215   MCHC 33.0 04/30/2023 2215   RDW 13.1 04/30/2023 2215   LYMPHSABS 2.0 04/30/2023 1426   MONOABS 0.8 04/30/2023 1426   EOSABS 0.1 04/30/2023 1426   BASOSABS 0.0 04/30/2023 1426    BMET    Component Value Date/Time   NA 136 04/30/2023 1436   K 3.5 04/30/2023 1436   CL 102 04/30/2023 1436   CO2 23 04/30/2023 1426   GLUCOSE 120 (H) 04/30/2023  1436   BUN 9 04/30/2023 1436   CREATININE 0.60 04/30/2023 2215   CREATININE 0.58 08/10/2022 0955   CALCIUM 9.8 04/30/2023 1426   GFRNONAA >60 04/30/2023 2215   GFRNONAA >60 08/10/2022 0955    IMAGING past 24 hours ECHOCARDIOGRAM COMPLETE BUBBLE STUDY  Result Date: 05/01/2023    ECHOCARDIOGRAM REPORT   Patient Name:   Kimberly Barker Date of Exam: 05/01/2023 Medical Rec #:  [id number]              Height:       65.0 in Accession #:    [id number]             Weight:       177.2 lb Date of Birth:  August 05, 1951              BSA:          1.879 m Patient Age:    72 years               BP:           159/67 mmHg Patient Gender: F                      HR:           81 bpm. Exam Location:  Inpatient Procedure: 2D Echo and Saline Contrast Bubble Study Indications:    stroke  History:        Patient has prior history of Echocardiogram examinations, most                 recent 10/14/2022. Arrythmias:PVC; Risk Factors:Hypertension,                 Dyslipidemia and Sleep Apnea.  Sonographer:    Delcie Roch RDCS Referring Phys: [id number] [facility] GOEL  Sonographer Comments: Image acquisition challenging due to respiratory motion. IMPRESSIONS  1. Left ventricular ejection fraction, by estimation, is 60 to 65%. The left ventricle has normal function. The left ventricle has no regional wall motion abnormalities. There is severe asymmetric left ventricular hypertrophy of the basal-septal segment. Left ventricular diastolic parameters are consistent with Grade I diastolic dysfunction (impaired relaxation).  2. Right ventricular systolic function is hyperdynamic. The right ventricular size is normal.  3. The mitral valve is degenerative. No evidence of mitral valve regurgitation.  4. The aortic valve was not well visualized. Aortic valve regurgitation is not visualized.  5. The inferior vena cava is normal in size with greater than 50% respiratory variability, suggesting right atrial pressure of 3 mmHg.  6. Agitated  saline contrast bubble study was negative, with no evidence of any interatrial shunt. Comparison(s): No significant change from prior study. Prior images reviewed side by side. FINDINGS  Left Ventricle: Small intracavitary gradient. Left ventricular ejection fraction, by estimation, is 60 to 65%. The left ventricle has normal function. The left ventricle has no regional wall motion abnormalities. The left ventricular internal cavity size was normal in size. There is severe asymmetric left ventricular hypertrophy of the basal-septal segment. Left ventricular diastolic parameters are consistent with Grade I diastolic dysfunction (impaired relaxation). Right Ventricle: The right ventricular size is normal. No increase in right ventricular wall thickness. Right ventricular systolic function is hyperdynamic. Left Atrium: Left atrial size was normal in size. Right Atrium: Right atrial size was normal in size. Pericardium: Trivial pericardial effusion is present. The pericardial effusion is anterior to the right ventricle. Presence of epicardial fat layer. Mitral Valve: The mitral valve is degenerative in appearance. Mild mitral annular calcification. No evidence of mitral valve regurgitation. Tricuspid Valve: The tricuspid valve is normal in structure. Tricuspid valve regurgitation is not demonstrated. Aortic Valve: The aortic valve was not well visualized. Aortic valve regurgitation is not visualized. Aortic valve mean gradient measures 16.0 mmHg. Aortic valve peak gradient measures 26.8 mmHg. Aortic valve area, by VTI measures 1.36 cm. Pulmonic Valve: The pulmonic valve was grossly normal. Pulmonic valve regurgitation is not visualized. No evidence of pulmonic stenosis. Aorta: The aortic root, ascending aorta and aortic arch are all structurally normal, with no evidence of dilitation or obstruction. Venous: The inferior vena cava is normal in size with greater than 50% respiratory variability, suggesting right atrial  pressure of 3 mmHg. IAS/Shunts: No atrial level shunt detected by color flow Doppler. Agitated saline contrast was given intravenously to evaluate for intracardiac shunting. Agitated saline contrast bubble study was negative, with no evidence of any interatrial shunt.  LEFT VENTRICLE PLAX 2D LVIDd:         3.60 cm   Diastology LVIDs:         2.40 cm   LV e' medial:    4.79 cm/s LV PW:         1.10 cm   LV E/e' medial:  22.1 LV IVS:  1.50 cm   LV e' lateral:   4.90 cm/s LVOT diam:     1.90 cm   LV E/e' lateral: 21.6 LV SV:         66 LV SV Index:   35 LVOT Area:     2.84 cm  RIGHT VENTRICLE             IVC RV Basal diam:  2.10 cm     IVC diam: 1.80 cm RV S prime:     13.60 cm/s TAPSE (M-mode): 2.0 cm LEFT ATRIUM             Index        RIGHT ATRIUM           Index LA diam:        3.90 cm 2.08 cm/m   RA Area:     11.60 cm LA Vol (A2C):   47.4 ml 25.22 ml/m  RA Volume:   25.70 ml  13.68 ml/m LA Vol (A4C):   59.5 ml 31.66 ml/m LA Biplane Vol: 57.8 ml 30.76 ml/m  AORTIC VALVE AV Area (Vmax):    1.30 cm AV Area (Vmean):   1.18 cm AV Area (VTI):     1.36 cm AV Vmax:           259.00 cm/s AV Vmean:          191.000 cm/s AV VTI:            0.482 m AV Peak Grad:      26.8 mmHg AV Mean Grad:      16.0 mmHg LVOT Vmax:         118.50 cm/s LVOT Vmean:        79.600 cm/s LVOT VTI:          0.232 m LVOT/AV VTI ratio: 0.48  AORTA Ao Root diam: 2.70 cm Ao Asc diam:  3.10 cm MITRAL VALVE MV Area (PHT): 3.99 cm     SHUNTS MV Decel Time: 190 msec     Systemic VTI:  0.23 m MV E velocity: 106.00 cm/s  Systemic Diam: 1.90 cm MV A velocity: 147.00 cm/s MV E/A ratio:  0.72 Riley Lam MD Electronically signed by Riley Lam MD Signature Date/Time: 05/01/2023/12:39:13 PM    Final    MR BRAIN WO CONTRAST  Result Date: 04/30/2023 CLINICAL DATA:  Acute neurologic deficit EXAM: MRI HEAD WITHOUT CONTRAST TECHNIQUE: Multiplanar, multiecho pulse sequences of the brain and surrounding structures were obtained  without intravenous contrast. COMPARISON:  None Available. FINDINGS: Brain: There is bilateral abnormal diffusion restriction of the globi pallidi. There are old right pontine and right centrum semiovale infarcts. No acute or chronic hemorrhage. There is multifocal hyperintense T2-weighted signal within the white matter. Generalized volume loss. A partially empty sella is incidentally noted. Vascular: Major flow voids are preserved. Skull and upper cervical spine: Normal calvarium and skull base. Visualized upper cervical spine and soft tissues are normal. Sinuses/Orbits:No paranasal sinus fluid levels or advanced mucosal thickening. No mastoid or middle ear effusion. Normal orbits. IMPRESSION: 1. Bilateral abnormal diffusion restriction within the globi pallidi. While these may be acute small vessel infarcts, the bilaterality suggests other possibilities such as toxin exposure or drug use. 2. Old right pontine and right centrum semiovale infarcts. Electronically Signed   By: Deatra Robinson M.D.   On: 04/30/2023 21:19   DG CHEST PORT 1 VIEW  Result Date: 04/30/2023 CLINICAL DATA:  Altered mental status EXAM: PORTABLE CHEST 1 VIEW COMPARISON:  None Available. FINDINGS: Transverse diameter of heart is slightly increased. There are no signs of pulmonary edema or focal pulmonary consolidation. There is no pleural effusion or pneumothorax. IMPRESSION: No active cardiopulmonary disease. Electronically Signed   By: Ernie Avena M.D.   On: 04/30/2023 16:35   CT ANGIO HEAD NECK W WO CM (CODE STROKE)  Result Date: 04/30/2023 CLINICAL DATA:  Neuro deficit, acute, stroke suspected. EXAM: CT ANGIOGRAPHY HEAD AND NECK WITH AND WITHOUT CONTRAST TECHNIQUE: Multidetector CT imaging of the head and neck was performed using the standard protocol during bolus administration of intravenous contrast. Multiplanar CT image reconstructions and MIPs were obtained to evaluate the vascular anatomy. Carotid stenosis measurements  (when applicable) are obtained utilizing NASCET criteria, using the distal internal carotid diameter as the denominator. RADIATION DOSE REDUCTION: This exam was performed according to the departmental dose-optimization program which includes automated exposure control, adjustment of the mA and/or kV according to patient size and/or use of iterative reconstruction technique. CONTRAST:  75mL OMNIPAQUE IOHEXOL 350 MG/ML SOLN COMPARISON:  CT head without contrast 04/30/2023. MR head without contrast 10/13/2022. FINDINGS: CTA NECK FINDINGS Aortic arch: A 3 vessel arch configuration is present. Atherosclerotic calcifications are present at the aortic arch and at the origin the left subclavian artery without significant stenosis. Right carotid system: The right common carotid artery within normal limits. Calcified and noncalcified plaque is present at the for carotid bifurcation. The minimal transverse diameter is 2 mm. This compares with a more normal distal right ICA of 4 mm. No tandem stenoses are present. Left carotid system: The left common carotid artery is within normal limits apart from minimal mural calcification just proximal to the bifurcation. Atherosclerotic changes are present at the carotid bifurcation and proximal left ICA without significant stenosis relative to the more distal vessel. Mural calcifications are noted along the cervical left ICA without focal stenosis. Vertebral arteries: The left vertebral artery is the dominant vessel. Dense calcifications are present at the origin of the left vertebral artery without significant stenosis. The right vertebral artery is hypoplastic. No significant stenosis is present in either vertebral artery in the neck. Skeleton: The vertebral body heights and alignment are normal. Mild rightward curvature is present in the cervical spine. No focal osseous lesions are present. Other neck: Soft tissues the neck are otherwise unremarkable. Salivary glands are within normal  limits. Thyroid is normal. No significant adenopathy is present. No focal mucosal or submucosal lesions are present Upper chest: The lung apices are clear. The thoracic inlet is within normal limits. Review of the MIP images confirms the above findings CTA HEAD FINDINGS Anterior circulation: Atherosclerotic calcifications are present within the cavernous internal carotid arteries bilaterally without a significant stenosis relative to the more distal vessel. The ICA termini are normal bilaterally. The A1 and M1 segments are normal. No significant anterior communicating artery is present. The MCA bifurcations are within normal limits bilaterally. Segmental narrowing is present within the distal ACA and MCA branch vessels. The most significant stenosis is a high-grade stenosis of a posterosuperior left M3 branch. Contrast enhancement is present distal to the stenosis. Posterior circulation: Extensive atherosclerotic changes are present at the dural margin of the left vertebral artery. High-grade stenosis is present in the proximal right V4 segment. The left PICA origin is visualized and normal. The vertebrobasilar junction is normal. Basilar artery is normal. The right superior cerebellar artery is duplicated. The superior cerebellar arteries are patent bilaterally. The proximal PCA vessels are within normal limits bilaterally. There is some  segmental narrowing of distal branches. Venous sinuses: The dural sinuses are patent. The straight sinus and deep cerebral veins are intact. Cortical veins are within normal limits. No significant vascular malformation is evident. Anatomic variants: None Review of the MIP images confirms the above findings IMPRESSION: 1. No emergent large vessel occlusion. 2. High-grade stenosis of a posterosuperior left M3 branch vessel. 3. Distal vessel irregularity within the Circle of Willis consistent with intracranial atherosclerosis. 4. Extensive atherosclerotic changes at the dural margin  of the left vertebral artery. 5. High-grade stenosis of the proximal right V4 segment. 6. Atherosclerotic changes at the carotid bifurcations and cavernous internal carotid arteries bilaterally without significant stenosis relative to the more distal vessels. 7.  Aortic Atherosclerosis (ICD10-I70.0). Electronically Signed   By: Marin Roberts M.D.   On: 04/30/2023 14:59   CT HEAD CODE STROKE WO CONTRAST  Result Date: 04/30/2023 CLINICAL DATA:  Code stroke.  Neuro deficit, acute, stroke suspected EXAM: CT HEAD WITHOUT CONTRAST TECHNIQUE: Contiguous axial images were obtained from the base of the skull through the vertex without intravenous contrast. RADIATION DOSE REDUCTION: This exam was performed according to the departmental dose-optimization program which includes automated exposure control, adjustment of the mA and/or kV according to patient size and/or use of iterative reconstruction technique. COMPARISON:  CT head 10/12/22 FINDINGS: Brain: Sequela of moderate chronic microvascular ischemic change with chronic infarcts in the bilateral corona radiata and basal ganglia. Compared to prior exam there is a new infarct in the right thalamus. There is also a new hypodensity in the upper pons, which may be artifactual. No hemorrhage. No hydrocephalus. No extra-axial fluid collection. Generalized volume loss. Vascular: No hyperdense vessel or unexpected calcification. Skull: Normal. Negative for fracture or focal lesion. Sinuses/Orbits: No middle ear or mastoid effusion. Paranasal sinuses are clear. Orbits are unremarkable. Other: None. ASPECTS [facility] Stroke Program Early CT Score): 10 when accounting for chronic findings IMPRESSION: 1. Compared to prior exam there is a new infarct in the right thalamus. There is also a new hypodensity in the upper pons, which may be artifactual. If there is concern for acute infarct, recommend MRI for further evaluation. 2. Sequela of moderate chronic microvascular ischemic  change with chronic infarcts in the bilateral corona radiata and basal ganglia. Findings were paged to Dr. Roda Shutters on 04/30/23 at 2:50 PM via [facility] paging system. Electronically Signed   By: Lorenza Cambridge M.D.   On: 04/30/2023 14:51    Vitals:   04/30/23 2332 05/01/23 0344 05/01/23 1043 05/01/23 1142  BP: (!) 140/79 (!) 159/67 (!) 152/72 (!) 147/80  Pulse: 71 72 76 76  Resp: 19  14 15   Temp: 97.7 F (36.5 C) 98.3 F (36.8 C) 98 F (36.7 C) 97.8 F (36.6 C)  TempSrc: Axillary Axillary Oral Oral  SpO2: 96% 95% 95% 94%  Weight:      Height:         PHYSICAL EXAM General:  Alert, well-nourished, well-developed patient in no acute distress Psych:  Mood and affect appropriate for situation CV: Regular rate and rhythm on monitor Respiratory:  Regular, unlabored respirations on room air GI: Abdomen soft and nontender   NEURO:  Mental Status: AA&O to self, place, month and year.  She was not able to state her correct age Speech/Language: speech with mild dysarthria.  Naming, repetition, fluency, and comprehension intact.  Cranial Nerves:  II: PERRL. Visual fields full.  III, IV, VI: EOMI. Eyelids elevate symmetrically.  V: Sensation is intact to light touch and symmetrical  to face.  VII: Slight left facial droop VIII: hearing intact to voice. IX, X: Palate elevates symmetrically. Phonation is normal.  JY:[id number] shrug 5/5. XII: tongue is midline without fasciculations. Motor: Left arm and left leg 4/5 with slight drift, right arm and leg 5/5.  Some decreased fine motor skills on left side Tone: is normal and bulk is normal Sensation- Intact to light touch bilaterally. Extinction absent to light touch to DSS.   Coordination: FTN intact bilaterally, HKS: no ataxia in BLE.No drift.  Gait- deferred   ASSESSMENT/PLAN  Acute Ischemic Infarct:  bilateral basal ganglia  Etiology: Intracranial atherosclerosis Code Stroke  CT head  new infarct in the right thalamus. Sequela of moderate  chronic microvascular ischemic change with chronic infarcts in the bilateral corona radiata and basal ganglia. CTA head & neck no LVO.  Grade stenosis of left M3, high-grade stenosis of right V4, atherosclerotic changes at carotid bifurcations and cavernous internal carotid arteries bilaterally, distal vessel irregularity within the circle of Willis consistent with intracranial atherosclerosis MRI bilateral basal ganglia acute infarcts 2D Echo EF 60 to 65%.  Left ventricle with severe asymmetric atrophy, grade 1 diastolic dysfunction.  Right ventricle hyperdynamic LDL 62 HgbA1c 6.4 VTE prophylaxis -Lovenox aspirin 81 mg daily prior to admission, now on aspirin 81 mg daily and Brilinta (ticagrelor) 90 mg bid for 4 weeks and then 0.81 mg alone. Therapy recommendations: Pending Disposition: Pending  Hx of Stroke/TIA July 2024 with pontine stroke was placed on aspirin and Plavix for 21 days then aspirin alone Was discharged to a SNF for rehab  Hypertension Home meds: Avapro 150 mg, metoprolol 25 mg Stable Blood Pressure Goal: BP less than 220/110   Hyperlipidemia Home meds: Zetia 10 mg simvastatin 40 mg, resumed in hospital LDL 62, goal < 70 Continue statin at discharge  Diabetes type II Controlled Home meds: Jardiance 25 mg, metformin 500 mg twice daily, Trulicity HgbA1c 6.4, goal < 7.0 CBGs SSI Recommend close follow-up with PCP for better DM control  Dysphagia Patient has post-stroke dysphagia, SLP consulted    Diet   Diet regular Room service appropriate? Yes; Fluid consistency: Thin   Advance diet as tolerated  Other Stroke Risk Factors Congestive heart failure Obstructive sleep apnea, on CPAP at home  Other Active Problems Thyroidism GERD Vitamin D deficiency  Hospital day # 0  Gevena Mart DNP, ACNPC-AG  [facility] I have personally obtained history,examined this patient, reviewed notes, independently viewed imaging studies, participated in medical  decision making and plan of care.ROS completed by me personally and pertinent positives fully documented  I have made any additions or clarifications directly to the above note. Agree with note above.  Patient has been having gradual worsening for a week and yesterday she had sudden onset of left facial droop and MRI shows bilateral basal ganglia infarcts.  Recommend aspirin and Brilinta for a month followed by aspirin alone and aggressive risk factor modification.  Long discussion patient and husband and multiple family members at the bedside and answered questions.  Discussed with Dr. Renford Dills.  Greater than 50% time during this 35-minute visit was spent in counseling and coordination of care about his lacunar stroke and discussion about treatment plan and answering question  Delia Heady, MD Medical Director [facility] Pager: [phone] 05/01/2023 3:06 PM   To contact Stroke Continuity provider, please refer to [email or web address]. After hours, contact General Neurology

## 2023-05-01 NOTE — Progress Notes (Signed)
  Echocardiogram 2D Echocardiogram has been performed.  Kimberly Barker 05/01/2023, 10:21 AM

## 2023-05-01 NOTE — Progress Notes (Signed)
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  She was brought to the emergency room for further evaluation.  Code stroke was called.  Neurology consulted.  MRI of the head showed possible acute small vessel infarcts.  Stroke workup initiated.  Assessment & Plan:  Principal Problem:   Lethargy Active Problems:   Erythrocytosis   TIA (transient ischemic attack)   UTI (urinary tract infection)   Acute ischemic stroke: Presented with lethargy, sleepiness, weakness.  MRI showed bilateral abnormal diffusion restriction within the globi pallidi with possibility of acute small vessel infarcts,Old right pontine and right centrum semiovale infarcts.  CT angio head and neck showed No emergent large vessel occlusion,high-grade stenosis of a posterosuperior left M3 branch vessel,high-grade stenosis of the proximal right V4 segment, extensive atherosclerotic disease.  Neurology consulted.  Stroke workup initiated.  LDL of 62, A1c of 6.4.  Echo is pending.  PT/OT/speech evaluation.  Patient has history of prior stroke and is weak on the left side.  Currently living in a nursing facility.  Takes aspirin, Zocor as an outpatient. Patient is comfortable today.  She was sitting on the chair.  Family at bedside.  She remains weak on the left side and has left facial droop but her speech is clear.  She is not confused  Suspected urinary tract infection: No fever or leukocytosis.  Empirically started on ceftriaxone.  Follow-up urine culture.Denied dysuria  Hypothyroidism/abnormal thyroid hormones: High TSH and free T4.  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  Continue home levothyroxine dose  Diabetes type 2: Continue sliding-scale  insulin.  Monitor blood sugars also takes Jardiance, Trulicity, metformin.  A1c of 6.4  Hypertension: Currently blood pressure stable.  Will allow permissive hypertension for now  Hyperlipidemia: On Zetia, Zocor          DVT prophylaxis:enoxaparin (LOVENOX) injection 40 mg Start: 04/30/23 2200 SCD's Start: 04/30/23 1911     Code Status: Full Code  Family Communication: Family at bedside  Patient status:Inpatient  Patient is from :SNF  Anticipated discharge to:SNF  Estimated DC date:after full stroke work up   Consultants: Neurology  Procedures:None  Antimicrobials:  Anti-infectives (From admission, onward)    Start     Dose/Rate Route Frequency Ordered Stop   05/01/23 1000  cefTRIAXone (ROCEPHIN) 1 g in sodium chloride 0.9 % 100 mL IVPB        1 g 200 mL/hr over 30 Minutes Intravenous Every 24 hours 04/30/23 1953 05/04/23 0959   04/30/23 1815  cefTRIAXone (ROCEPHIN) 1 g in sodium chloride 0.9 % 100 mL IVPB        1 g 200 mL/hr over 30 Minutes Intravenous  Once 04/30/23 1808 04/30/23 2019       Subjective: Patient seen and examined at the bedside this morning.  Hemodynamically stable.  Very comfortable.  Family at bedside.  She was sitting in the chair.  She remains weak on the left side and has left facial droop but her speech is clear.  She is not confused  Objective: Vitals:   04/30/23 2115 04/30/23 2155 04/30/23 2332 05/01/23 0344  BP: (!) 150/73 127/69 (!) 140/79 (!) 159/67  Pulse: 76 72 71 72  Resp: 18 20 19    Temp:  98.2 F (36.8 C) 97.7 F (36.5 C) 98.3 F (  36.8 C)  TempSrc:  Axillary Axillary Axillary  SpO2: 98% 95% 96% 95%  Weight:      Height:        Intake/Output Summary (Last 24 hours) at 05/01/2023 0755 Last data filed at 05/01/2023 0345 Gross per 24 hour  Intake 300 ml  Output 1075 ml  Net -775 ml   Filed Weights   04/30/23 1432 04/30/23 1450  Weight: 80.4 kg 80.4 kg    Examination:  General exam: Overall comfortable, not in  distress, obese, deconditioned HEENT: PERRL, left facial droop Respiratory system:  no wheezes or crackles  Cardiovascular system: S1 & S2 heard, RRR.  Gastrointestinal system: Abdomen is nondistended, soft and nontender. Central nervous system: Alert and oriented, left hemiparesis Extremities: No edema, no clubbing ,no cyanosis Skin: No rashes, no ulcers,no icterus     Data Reviewed: I have personally reviewed following labs and imaging studies  CBC: Recent Labs  Lab 04/30/23 1426 04/30/23 1436 04/30/23 2215  WBC 12.4*  --  10.2  NEUTROABS 9.5*  --   --   HGB 15.9* 16.3* 13.2  HCT 49.0* 48.0* 40.0  MCV 95.5  --  96.6  PLT 269  --  226   Basic Metabolic Panel: Recent Labs  Lab 04/30/23 1426 04/30/23 1436 04/30/23 2215  NA 135 136  --   K 3.4* 3.5  --   CL 101 102  --   CO2 23  --   --   GLUCOSE 124* 120*  --   BUN 9 9  --   CREATININE 0.62 0.40* 0.60  CALCIUM 9.8  --   --      Recent Results (from the past 240 hour(s))  Culture, blood (Routine X 2) w Reflex to ID Panel     Status: None (Preliminary result)   Collection Time: 04/30/23 10:23 PM   Specimen: BLOOD  Result Value Ref Range Status   Specimen Description BLOOD BLOOD RIGHT ARM  Final   Special Requests   Final    BOTTLES DRAWN AEROBIC ONLY Blood Culture adequate volume Performed at [facility], [address] [place], [place] [address]    Culture PENDING  Incomplete   Report Status PENDING  Incomplete     Radiology Studies: MR BRAIN WO CONTRAST  Result Date: 04/30/2023 CLINICAL DATA:  Acute neurologic deficit EXAM: MRI HEAD WITHOUT CONTRAST TECHNIQUE: Multiplanar, multiecho pulse sequences of the brain and surrounding structures were obtained without intravenous contrast. COMPARISON:  None Available. FINDINGS: Brain: There is bilateral abnormal diffusion restriction of the globi pallidi. There are old right pontine and right centrum semiovale infarcts. No acute or chronic hemorrhage. There is  multifocal hyperintense T2-weighted signal within the white matter. Generalized volume loss. A partially empty sella is incidentally noted. Vascular: Major flow voids are preserved. Skull and upper cervical spine: Normal calvarium and skull base. Visualized upper cervical spine and soft tissues are normal. Sinuses/Orbits:No paranasal sinus fluid levels or advanced mucosal thickening. No mastoid or middle ear effusion. Normal orbits. IMPRESSION: 1. Bilateral abnormal diffusion restriction within the globi pallidi. While these may be acute small vessel infarcts, the bilaterality suggests other possibilities such as toxin exposure or drug use. 2. Old right pontine and right centrum semiovale infarcts. Electronically Signed   By: Deatra Robinson M.D.   On: 04/30/2023 21:19   DG CHEST PORT 1 VIEW  Result Date: 04/30/2023 CLINICAL DATA:  Altered mental status EXAM: PORTABLE CHEST 1 VIEW COMPARISON:  None Available. FINDINGS: Transverse diameter of  heart is slightly increased. There are no signs of pulmonary edema or focal pulmonary consolidation. There is no pleural effusion or pneumothorax. IMPRESSION: No active cardiopulmonary disease. Electronically Signed   By: Ernie Avena M.D.   On: 04/30/2023 16:35   CT ANGIO HEAD NECK W WO CM (CODE STROKE)  Result Date: 04/30/2023 CLINICAL DATA:  Neuro deficit, acute, stroke suspected. EXAM: CT ANGIOGRAPHY HEAD AND NECK WITH AND WITHOUT CONTRAST TECHNIQUE: Multidetector CT imaging of the head and neck was performed using the standard protocol during bolus administration of intravenous contrast. Multiplanar CT image reconstructions and MIPs were obtained to evaluate the vascular anatomy. Carotid stenosis measurements (when applicable) are obtained utilizing NASCET criteria, using the distal internal carotid diameter as the denominator. RADIATION DOSE REDUCTION: This exam was performed according to the departmental dose-optimization program which includes automated  exposure control, adjustment of the mA and/or kV according to patient size and/or use of iterative reconstruction technique. CONTRAST:  75mL OMNIPAQUE IOHEXOL 350 MG/ML SOLN COMPARISON:  CT head without contrast 04/30/2023. MR head without contrast 10/13/2022. FINDINGS: CTA NECK FINDINGS Aortic arch: A 3 vessel arch configuration is present. Atherosclerotic calcifications are present at the aortic arch and at the origin the left subclavian artery without significant stenosis. Right carotid system: The right common carotid artery within normal limits. Calcified and noncalcified plaque is present at the for carotid bifurcation. The minimal transverse diameter is 2 mm. This compares with a more normal distal right ICA of 4 mm. No tandem stenoses are present. Left carotid system: The left common carotid artery is within normal limits apart from minimal mural calcification just proximal to the bifurcation. Atherosclerotic changes are present at the carotid bifurcation and proximal left ICA without significant stenosis relative to the more distal vessel. Mural calcifications are noted along the cervical left ICA without focal stenosis. Vertebral arteries: The left vertebral artery is the dominant vessel. Dense calcifications are present at the origin of the left vertebral artery without significant stenosis. The right vertebral artery is hypoplastic. No significant stenosis is present in either vertebral artery in the neck. Skeleton: The vertebral body heights and alignment are normal. Mild rightward curvature is present in the cervical spine. No focal osseous lesions are present. Other neck: Soft tissues the neck are otherwise unremarkable. Salivary glands are within normal limits. Thyroid is normal. No significant adenopathy is present. No focal mucosal or submucosal lesions are present Upper chest: The lung apices are clear. The thoracic inlet is within normal limits. Review of the MIP images confirms the above  findings CTA HEAD FINDINGS Anterior circulation: Atherosclerotic calcifications are present within the cavernous internal carotid arteries bilaterally without a significant stenosis relative to the more distal vessel. The ICA termini are normal bilaterally. The A1 and M1 segments are normal. No significant anterior communicating artery is present. The MCA bifurcations are within normal limits bilaterally. Segmental narrowing is present within the distal ACA and MCA branch vessels. The most significant stenosis is a high-grade stenosis of a posterosuperior left M3 branch. Contrast enhancement is present distal to the stenosis. Posterior circulation: Extensive atherosclerotic changes are present at the dural margin of the left vertebral artery. High-grade stenosis is present in the proximal right V4 segment. The left PICA origin is visualized and normal. The vertebrobasilar junction is normal. Basilar artery is normal. The right superior cerebellar artery is duplicated. The superior cerebellar arteries are patent bilaterally. The proximal PCA vessels are within normal limits bilaterally. There is some segmental narrowing of distal branches. Venous  sinuses: The dural sinuses are patent. The straight sinus and deep cerebral veins are intact. Cortical veins are within normal limits. No significant vascular malformation is evident. Anatomic variants: None Review of the MIP images confirms the above findings IMPRESSION: 1. No emergent large vessel occlusion. 2. High-grade stenosis of a posterosuperior left M3 branch vessel. 3. Distal vessel irregularity within the Circle of Willis consistent with intracranial atherosclerosis. 4. Extensive atherosclerotic changes at the dural margin of the left vertebral artery. 5. High-grade stenosis of the proximal right V4 segment. 6. Atherosclerotic changes at the carotid bifurcations and cavernous internal carotid arteries bilaterally without significant stenosis relative to the more  distal vessels. 7.  Aortic Atherosclerosis (ICD10-I70.0). Electronically Signed   By: Marin Roberts M.D.   On: 04/30/2023 14:59   CT HEAD CODE STROKE WO CONTRAST  Result Date: 04/30/2023 CLINICAL DATA:  Code stroke.  Neuro deficit, acute, stroke suspected EXAM: CT HEAD WITHOUT CONTRAST TECHNIQUE: Contiguous axial images were obtained from the base of the skull through the vertex without intravenous contrast. RADIATION DOSE REDUCTION: This exam was performed according to the departmental dose-optimization program which includes automated exposure control, adjustment of the mA and/or kV according to patient size and/or use of iterative reconstruction technique. COMPARISON:  CT head 10/12/22 FINDINGS: Brain: Sequela of moderate chronic microvascular ischemic change with chronic infarcts in the bilateral corona radiata and basal ganglia. Compared to prior exam there is a new infarct in the right thalamus. There is also a new hypodensity in the upper pons, which may be artifactual. No hemorrhage. No hydrocephalus. No extra-axial fluid collection. Generalized volume loss. Vascular: No hyperdense vessel or unexpected calcification. Skull: Normal. Negative for fracture or focal lesion. Sinuses/Orbits: No middle ear or mastoid effusion. Paranasal sinuses are clear. Orbits are unremarkable. Other: None. ASPECTS [facility] Stroke Program Early CT Score): 10 when accounting for chronic findings IMPRESSION: 1. Compared to prior exam there is a new infarct in the right thalamus. There is also a new hypodensity in the upper pons, which may be artifactual. If there is concern for acute infarct, recommend MRI for further evaluation. 2. Sequela of moderate chronic microvascular ischemic change with chronic infarcts in the bilateral corona radiata and basal ganglia. Findings were paged to Dr. Roda Shutters on 04/30/23 at 2:50 PM via [facility] paging system. Electronically Signed   By: Lorenza Cambridge M.D.   On: 04/30/2023 14:51    Scheduled  Meds:   stroke: early stages of recovery book   Does not apply Once   aspirin EC  81 mg Oral Daily   aspirin  300 mg Rectal Daily   Or   aspirin  325 mg Oral Daily   empagliflozin  25 mg Oral Daily   enoxaparin (LOVENOX) injection  40 mg Subcutaneous Q24H   ezetimibe  10 mg Oral QHS   insulin aspart  0-15 Units Subcutaneous TID WC   insulin aspart  0-5 Units Subcutaneous QHS   levothyroxine  100 mcg Oral Q0600   simvastatin  40 mg Oral q1800   Continuous Infusions:  sodium chloride 150 mL/hr at 05/01/23 0306   cefTRIAXone (ROCEPHIN)  IV       LOS: 0 days   Burnadette Pop, MD [facility] P8/06/2023, 7:55 AM

## 2023-05-01 NOTE — Evaluation (Signed)
Physical Therapy Evaluation  Patient Details Name: Kimberly Barker MRN: [id number] DOB: 1950/11/08 Today's Date: 05/01/2023  History of Present Illness  Pt is a 72 y/o female who presents from SNF with increased lethargy. MRI revealed possible acute small vessel infarcts vs toxin exposure or drugs. Pt currently at [facility] rehab after recent CVA in July 2024. Other PMH significant for DM, dislocated shoulder 2005 (L), HTN, hypothyroidism, panic attacks, R anle fracture 2014.   Clinical Impression  Pt admitted with above diagnosis. Pt currently with functional limitations due to the deficits listed below (see PT Problem List). At the time of PT eval pt was able to perform transfers and ambulation with up to mod assist and RW for support. Pt reports she is "doing worse now than at rehab". Acutely, pt will benefit from acute skilled PT to increase their independence and safety with mobility to allow discharge.           If plan is discharge home, recommend the following: A little help with walking and/or transfers;A little help with bathing/dressing/bathroom;Assistance with cooking/housework;Assist for transportation;Help with stairs or ramp for entrance;Supervision due to cognitive status   Can travel by private vehicle   Yes    Equipment Recommendations None recommended by PT  Recommendations for Other Services       Functional Status Assessment Patient has had a recent decline in their functional status and demonstrates the ability to make significant improvements in function in a reasonable and predictable amount of time.     Precautions / Restrictions Precautions Precautions: Fall Restrictions Weight Bearing Restrictions: No      Mobility  Bed Mobility Overal bed mobility: Needs Assistance Bed Mobility: Rolling, Sidelying to Sit Rolling: Min assist Sidelying to sit: Mod assist       General bed mobility comments: VC's for sequencing. Hand over hand assist to reach for  the railings. Assist for LE's off EOB, and trunk elevation to full sitting position.    Transfers Overall transfer level: Needs assistance Equipment used: Rolling walker (2 wheels) Transfers: Sit to/from Stand Sit to Stand: Mod assist, Min assist           General transfer comment: Min-mod assist for power up to full stand. Increased time for pt to gain/maintain standing balance.    Ambulation/Gait Ambulation/Gait assistance: Min assist Gait Distance (Feet): 10 Feet (10' + 13') Assistive device: Rolling walker (2 wheels) Gait Pattern/deviations: Step-through pattern, Decreased stride length, Decreased weight shift to left, Trunk flexed, Antalgic Gait velocity: Decreased Gait velocity interpretation: <1.31 ft/sec, indicative of household ambulator   General Gait Details: VC's for improved posture, closer walker proximity and forward gaze. Pt required assist for walker management and occasional balance support.  Stairs            Wheelchair Mobility     Tilt Bed    Modified Rankin (Stroke Patients Only) Modified Rankin (Stroke Patients Only) Pre-Morbid Rankin Score: Moderately severe disability Modified Rankin: Moderately severe disability     Balance Overall balance assessment: Needs assistance Sitting-balance support: Feet supported, No upper extremity supported Sitting balance-Leahy Scale: Fair     Standing balance support: Bilateral upper extremity supported, During functional activity, Reliant on assistive device for balance Standing balance-Leahy Scale: Poor                               Pertinent Vitals/Pain Pain Assessment Pain Assessment: No/denies pain    Home Living Family/patient expects  to be discharged to:: Skilled nursing facility Living Arrangements: Spouse/significant other                 Additional Comments: At [employer] [facility] in [place].    Prior Function Prior Level of Function : Independent/Modified Independent              Mobility Comments: Pt reports she was able to participate in therapy and walking down the hall, but the last couple days slowed down and became lethargic. ADLs Comments: Pt reports she was able to transfer herself, participated in bathing and dressing, could do both upper body and lower body but staff helped her stand to pull up pants. Staff was helping with socks/shoes.     Extremity/Trunk Assessment   Upper Extremity Assessment Upper Extremity Assessment: Defer to OT evaluation;LUE deficits/detail LUE Deficits / Details: Decreased strength and AROM consistent with recent stroke. Unsure if deficits are exacerbated this admission or residual from prior stroke. LUE Coordination: decreased fine motor;decreased gross motor    Lower Extremity Assessment Lower Extremity Assessment: Generalized weakness;LLE deficits/detail LLE Deficits / Details: Decreased strenght and AROM consistent with prior stroke and new diagnosis. Pt reports worse than when she was at [facility] rehab. LLE Coordination: decreased gross motor;decreased fine motor    Cervical / Trunk Assessment Cervical / Trunk Assessment: Other exceptions Cervical / Trunk Exceptions: Forward head posture with rounded shoulders. Noted R gaze and holding head rotated to the R.  Communication   Communication Communication: No apparent difficulties  Cognition Arousal: Alert Behavior During Therapy: Flat affect Overall Cognitive Status: Impaired/Different from baseline Area of Impairment: Following commands, Safety/judgement, Awareness, Problem solving, Memory, Attention                   Current Attention Level: Sustained Memory: Decreased short-term memory Following Commands: Follows one step commands consistently, Follows one step commands with increased time Safety/Judgement: Decreased awareness of safety, Decreased awareness of deficits Awareness: Intellectual Problem Solving: Slow processing, Decreased initiation,  Requires verbal cues, Difficulty sequencing          General Comments      Exercises     Assessment/Plan    PT Assessment Patient needs continued PT services  PT Problem List Decreased strength;Decreased activity tolerance;Decreased balance;Decreased mobility;Decreased range of motion;Decreased coordination;Decreased cognition;Decreased knowledge of use of DME;Decreased safety awareness;Decreased knowledge of precautions       PT Treatment Interventions DME instruction;Gait training;Functional mobility training;Therapeutic activities;Therapeutic exercise;Balance training;Patient/family education    PT Goals (Current goals can be found in the Care Plan section)  Acute Rehab PT Goals Patient Stated Goal: Return to SNF rehab PT Goal Formulation: With patient/family Time For Goal Achievement: 05/15/23 Potential to Achieve Goals: Good    Frequency Min 1X/week     Co-evaluation               AM-PAC PT "6 Clicks" Mobility  Outcome Measure Help needed turning from your back to your side while in a flat bed without using bedrails?: A Little Help needed moving from lying on your back to sitting on the side of a flat bed without using bedrails?: A Lot Help needed moving to and from a bed to a chair (including a wheelchair)?: A Lot Help needed standing up from a chair using your arms (e.g., wheelchair or bedside chair)?: A Lot Help needed to walk in hospital room?: A Little Help needed climbing 3-5 steps with a railing? : Total 6 Click Score: 13    End of Session Equipment  Utilized During Treatment: Gait belt Activity Tolerance: Patient tolerated treatment well Patient left: in chair;with call bell/phone within reach;with chair alarm set;with nursing/sitter in room;with family/visitor present Nurse Communication: Mobility status PT Visit Diagnosis: Unsteadiness on feet (R26.81);Other symptoms and signs involving the nervous system (R29.898)    Time: 8413-2440 PT Time  Calculation (min) (ACUTE ONLY): 38 min   Charges:   PT Evaluation $PT Eval Moderate Complexity: 1 Mod PT Treatments $Gait Training: 23-37 mins PT General Charges $$ ACUTE PT VISIT: 1 Visit         Conni Slipper, PT, DPT Acute Rehabilitation Services Secure Chat Preferred Office: [phone]   Marylynn Pearson 05/01/2023, 10:59 AM

## 2023-05-01 NOTE — Evaluation (Signed)
Occupational Therapy Evaluation Patient Details Name: Kimberly Barker MRN: [id number] DOB: 06/07/1951 Today's Date: 05/01/2023   History of Present Illness Pt is a 72 y/o female who presents from SNF with increased lethargy. MRI revealed possible acute small vessel infarcts vs toxin exposure or drugs. Pt currently at [facility] rehab after recent CVA in July 2024. Other PMH significant for DM, dislocated shoulder 2005 (L), HTN, hypothyroidism, panic attacks, R anle fracture 2014.   Clinical Impression   Kimberly Barker was evaluated s/p the above admission list. She is currently at [facility] rehab at [facility], she needs assist for ADLs and is working with therapies on walking with a RW at baseline. Upon evaluation the pt was limited by impaired cognition, weakness, L hemiplegia, unsteady gait and poor activity tolerance. Overall she needed up to mod A for bed mobility and simple transfers with significant cues for safety. Due to the deficits listed below the pt also needs up to max A for LB ADLs and min A for UB ADLs. Pt will benefit from continued acute OT services and skilled inpatient follow up therapy, <3 hours/day.        If plan is discharge home, recommend the following: A lot of help with walking and/or transfers;A lot of help with bathing/dressing/bathroom;Assistance with cooking/housework;Direct supervision/assist for financial management;Direct supervision/assist for medications management;Assist for transportation;Help with stairs or ramp for entrance;Supervision due to cognitive status    Functional Status Assessment  Patient has had a recent decline in their functional status and demonstrates the ability to make significant improvements in function in a reasonable and predictable amount of time.  Equipment Recommendations  None recommended by OT       Precautions / Restrictions Precautions Precautions: Fall Restrictions Weight Bearing Restrictions: No      Mobility Bed Mobility Overal bed  mobility: Needs Assistance Bed Mobility: Rolling, Sidelying to Sit Rolling: Min assist Sidelying to sit: Mod assist            Transfers Overall transfer level: Needs assistance Equipment used: Rolling walker (2 wheels) Transfers: Sit to/from Stand Sit to Stand: Mod assist           General transfer comment: mod from EOB, min from [facility]      Balance Overall balance assessment: Needs assistance Sitting-balance support: Feet supported, No upper extremity supported Sitting balance-Leahy Scale: Fair     Standing balance support: Bilateral upper extremity supported, During functional activity, Reliant on assistive device for balance Standing balance-Leahy Scale: Poor                             ADL either performed or assessed with clinical judgement   ADL Overall ADL's : Needs assistance/impaired Eating/Feeding: Set up;Sitting   Grooming: Sitting;Minimal assistance   Upper Body Bathing: Moderate assistance;Sitting   Lower Body Bathing: Maximal assistance;Sit to/from stand   Upper Body Dressing : Minimal assistance;Sitting   Lower Body Dressing: Maximal assistance;Sit to/from stand   Toilet Transfer: Stand-pivot;Moderate assistance;BSC/3in1   Toileting- Clothing Manipulation and Hygiene: Maximal assistance;Sit to/from stand       Functional mobility during ADLs: Moderate assistance General ADL Comments: limited by weakness, impiared cognition     Vision Baseline Vision/History: 0 No visual deficits Vision Assessment?: No apparent visual deficits     Perception Perception: Not tested       Praxis Praxis: Not tested       Pertinent Vitals/Pain Pain Assessment Pain Assessment: No/denies pain  Extremity/Trunk Assessment Upper Extremity Assessment Upper Extremity Assessment: Generalized weakness;RUE deficits/detail;LUE deficits/detail RUE Deficits / Details: overall WFL, weak LUE Deficits / Details: Decreased strength and AROM  consistent with recent stroke. Unsure if deficits are exacerbated this admission or residual from prior stroke. LUE Coordination: decreased fine motor;decreased gross motor   Lower Extremity Assessment Lower Extremity Assessment: Defer to PT evaluation   Cervical / Trunk Assessment Cervical / Trunk Assessment: Other exceptions Cervical / Trunk Exceptions: Forward head posture with rounded shoulders. Noted R gaze and holding head rotated to the R.   Communication Communication Communication: No apparent difficulties   Cognition Arousal: Alert Behavior During Therapy: Flat affect Overall Cognitive Status: Impaired/Different from baseline Area of Impairment: Following commands, Safety/judgement, Awareness, Problem solving, Memory, Attention                   Current Attention Level: Sustained Memory: Decreased short-term memory Following Commands: Follows one step commands consistently, Follows one step commands with increased time Safety/Judgement: Decreased awareness of safety, Decreased awareness of deficits Awareness: Intellectual Problem Solving: Slow processing, Decreased initiation, Requires verbal cues, Difficulty sequencing General Comments: follows simple commands, intermittently confused.     General Comments  VSS, dtr present    Exercises     Shoulder Instructions      Home Living Family/patient expects to be discharged to:: Skilled nursing facility Living Arrangements: Spouse/significant other                               Additional Comments: At [employer] [facility] in [place].      Prior Functioning/Environment Prior Level of Function : Needs assist             Mobility Comments: Pt reports she was able to participate in therapy and walking down the hall, but the last couple days slowed down and became lethargic. ADLs Comments: Pt reports she was able to transfer herself, participated in bathing and dressing, could do both upper body and lower  body but staff helped her stand to pull up pants. Staff was helping with socks/shoes.        OT Problem List: Decreased strength;Decreased range of motion;Decreased activity tolerance;Impaired balance (sitting and/or standing);Decreased cognition;Decreased safety awareness;Decreased knowledge of use of DME or AE;Decreased knowledge of precautions      OT Treatment/Interventions: Self-care/ADL training;DME and/or AE instruction;Therapeutic activities;Balance training;Patient/family education    OT Goals(Current goals can be found in the care plan section) Acute Rehab OT Goals Patient Stated Goal: to get up OT Goal Formulation: With patient Time For Goal Achievement: 05/15/23 Potential to Achieve Goals: Good ADL Goals Pt Will Perform Grooming: with contact guard assist;standing Pt Will Perform Upper Body Dressing: with set-up;sitting Pt Will Perform Lower Body Dressing: with mod assist;sit to/from stand Pt Will Transfer to Toilet: with min assist;bedside commode  OT Frequency: Min 1X/week    Co-evaluation              AM-PAC OT "6 Clicks" Daily Activity     Outcome Measure Help from another person eating meals?: A Little Help from another person taking care of personal grooming?: A Little Help from another person toileting, which includes using toliet, bedpan, or urinal?: A Lot Help from another person bathing (including washing, rinsing, drying)?: A Lot Help from another person to put on and taking off regular upper body clothing?: A Little Help from another person to put on and taking off regular lower body clothing?: A  Lot 6 Click Score: 15   End of Session Equipment Utilized During Treatment: Gait belt Nurse Communication: Mobility status  Activity Tolerance: Patient tolerated treatment well Patient left: in chair;with chair alarm set;with call bell/phone within reach;with family/visitor present  OT Visit Diagnosis: Unsteadiness on feet (R26.81);Other abnormalities of  gait and mobility (R26.89);Muscle weakness (generalized) (M62.81);Hemiplegia and hemiparesis                Time: 1610-9604 OT Time Calculation (min): 28 min Charges:  OT General Charges $OT Visit: 1 Visit OT Evaluation $OT Eval Moderate Complexity: 1 Mod  Derenda Mis, OTR/L Acute Rehabilitation Services Office [phone] Secure Chat Communication Preferred   Donia Pounds 05/01/2023, 3:11 PM

## 2023-05-02 DIAGNOSIS — R5383 Other fatigue: Secondary | ICD-10-CM | POA: Diagnosis not present

## 2023-05-02 LAB — CBC
HCT: 43.4 % (ref 36.0–46.0)
Hemoglobin: 14.1 g/dL (ref 12.0–15.0)
MCH: 30.7 pg (ref 26.0–34.0)
MCHC: 32.5 g/dL (ref 30.0–36.0)
MCV: 94.6 fL (ref 80.0–100.0)
Platelets: 234 10*3/uL (ref 150–400)
RBC: 4.59 MIL/uL (ref 3.87–5.11)
RDW: 13.1 % (ref 11.5–15.5)
WBC: 8.5 10*3/uL (ref 4.0–10.5)
nRBC: 0 % (ref 0.0–0.2)

## 2023-05-02 LAB — GLUCOSE, CAPILLARY
Glucose-Capillary: 119 mg/dL — ABNORMAL HIGH (ref 70–99)
Glucose-Capillary: 99 mg/dL (ref 70–99)
Glucose-Capillary: 132 mg/dL — ABNORMAL HIGH (ref 70–99)
Glucose-Capillary: 129 mg/dL — ABNORMAL HIGH (ref 70–99)

## 2023-05-02 LAB — BASIC METABOLIC PANEL WITH GFR
Anion gap: 11 (ref 5–15)
BUN: 6 mg/dL — ABNORMAL LOW (ref 8–23)
CO2: 24 mmol/L (ref 22–32)
Calcium: 9.7 mg/dL (ref 8.9–10.3)
Chloride: 104 mmol/L (ref 98–111)
Creatinine, Ser: 0.54 mg/dL (ref 0.44–1.00)
GFR, Estimated: >60 mL/min (ref >60)
Glucose, Bld: 116 mg/dL — ABNORMAL HIGH (ref 70–99)
Potassium: 3.3 mmol/L — ABNORMAL LOW (ref 3.5–5.1)
Sodium: 139 mmol/L (ref 135–145)

## 2023-05-02 MED ORDER — IRBESARTAN 150 MG PO TABS
150.0000 mg | ORAL_TABLET | Freq: Every day | ORAL | Status: DC
  Administered 2023-05-02 – 2023-05-03 (×2): 150 mg via ORAL
  Filled 2023-05-02 (×2): qty 1

## 2023-05-02 MED ORDER — POTASSIUM CHLORIDE CRYS ER 20 MEQ PO TBCR
40.0000 meq | EXTENDED_RELEASE_TABLET | Freq: Once | ORAL | Status: AC
  Administered 2023-05-02: 40 meq via ORAL
  Filled 2023-05-02: qty 2

## 2023-05-02 NOTE — Progress Notes (Signed)
Patient arrived on unit accompanied by transport and family. Lethargic but arousable and able to make needs known. Physical assessment and 2 RN skin inspection complete; see flowsheet for details. VSS, pain/discomfort denied and no acute distress noted. Husband and son present on admission. Bed locked and lowered, call bell and necessities within reach, WCTM.

## 2023-05-02 NOTE — Progress Notes (Addendum)
STROKE TEAM PROGRESS NOTE   BRIEF HPI Ms. Kimberly Barker is a 72 y.o. 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 Per EMS, pt had left sided weakness and left facial droop from last stroke in July, discharged to rehab in [place]. She recovered well, and EMS stated that pt no deficit at baseline. LSW 10am, and in the afternoon she was found to have left sided weakness and left facial droop.    SIGNIFICANT HOSPITAL EVENTS MRI brain with acute bilateral basal ganglia infarcts  INTERIM HISTORY/SUBJECTIVE Her family is at the bedside. Patient is laying in bed in NAD.  Neurological exam is stable and unchanged.  She states she is feeling much better today.  No complaints.  Vital signs stable OBJECTIVE  CBC    Component Value Date/Time   WBC 8.5 05/02/2023 0235   RBC 4.59 05/02/2023 0235   HGB 14.1 05/02/2023 0235   HGB 15.1 (H) 02/09/2023 1109   HCT 43.4 05/02/2023 0235   PLT 234 05/02/2023 0235   PLT 308 02/09/2023 1109   MCV 94.6 05/02/2023 0235   MCH 30.7 05/02/2023 0235   MCHC 32.5 05/02/2023 0235   RDW 13.1 05/02/2023 0235   LYMPHSABS 2.0 04/30/2023 1426   MONOABS 0.8 04/30/2023 1426   EOSABS 0.1 04/30/2023 1426   BASOSABS 0.0 04/30/2023 1426    BMET    Component Value Date/Time   NA 139 05/02/2023 0235   K 3.3 (L) 05/02/2023 0235   CL 104 05/02/2023 0235   CO2 24 05/02/2023 0235   GLUCOSE 116 (H) 05/02/2023 0235   BUN 6 (L) 05/02/2023 0235   CREATININE 0.54 05/02/2023 0235   CREATININE 0.58 08/10/2022 0955   CALCIUM 9.7 05/02/2023 0235   GFRNONAA >60 05/02/2023 0235   GFRNONAA >60 08/10/2022 0955    IMAGING past 24 hours No results found.  Vitals:   05/01/23 2343 05/02/23 0430 05/02/23 0750 05/02/23 1209  BP: (!) 163/72 (!) 165/72 (!) 157/78 (!) 172/87  Pulse: 71 70 72 75  Resp: 19 19    Temp: 97.7  F (36.5 C) 97.7 F (36.5 C) 98 F (36.7 C) 98.7 F (37.1 C)  TempSrc: Oral Oral Oral Oral  SpO2: 95% 94% 96% 95%  Weight:      Height:         PHYSICAL EXAM General:  Alert, well-nourished, well-developed patient in no acute distress Psych:  Mood and affect appropriate for situation CV: Regular rate and rhythm on monitor Respiratory:  Regular, unlabored respirations on room air GI: Abdomen soft and nontender   NEURO:  Mental Status: AA&O to self, place, month and year.  She was not able to state her correct age Speech/Language: speech with mild dysarthria.  Naming, repetition, fluency, and comprehension intact.  Cranial Nerves:  II: PERRL. Visual fields full.  III, IV, VI: EOMI. Eyelids elevate symmetrically.  V: Sensation is intact to light touch and symmetrical to face.  VII: Slight left facial droop VIII: hearing intact to voice. IX, X: Palate elevates symmetrically. Phonation is normal.  RU:[id number] shrug 5/5. XII: tongue is midline without fasciculations. Motor: Left arm and left leg 4/5 with slight drift, right arm and leg 5/5.  Some decreased fine motor skills on left side Tone: is normal and bulk is normal Sensation- Intact to light touch bilaterally. Extinction absent to light touch to  DSS.   Coordination: FTN intact bilaterally, HKS: no ataxia in BLE.No drift.  Gait- deferred   ASSESSMENT/PLAN  Acute Ischemic Infarct:  bilateral basal ganglia  Etiology: Small vessel disease and at atherosclerosis  code Stroke  CT head  new infarct in the right thalamus. Sequela of moderate chronic microvascular ischemic change with chronic infarcts in the bilateral corona radiata and basal ganglia. CTA head & neck no LVO.  Grade stenosis of left M3, high-grade stenosis of right V4, atherosclerotic changes at carotid bifurcations and cavernous internal carotid arteries bilaterally, distal vessel irregularity within the circle of Willis consistent with intracranial  atherosclerosis MRI bilateral basal ganglia acute infarcts 2D Echo EF 60 to 65%.  Left ventricle with severe asymmetric atrophy, grade 1 diastolic dysfunction.  Right ventricle hyperdynamic LDL 62 HgbA1c 6.4 VTE prophylaxis -Lovenox aspirin 81 mg daily prior to admission, now on aspirin 81 mg daily and Brilinta (ticagrelor) 90 mg bid for 4 weeks and then 81 mg ASA alone. Will need neurology outpatient follow up in 8 weeks after discharge  Therapy recommendations: Pending Disposition: Pending  Hx of Stroke/TIA July 2024 with pontine stroke was placed on aspirin and Plavix for 21 days then aspirin alone Was discharged to a SNF for rehab  Hypertension Home meds: Avapro 150 mg, metoprolol 25 mg Stable Blood Pressure Goal: BP less than 220/110   Hyperlipidemia Home meds: Zetia 10 mg simvastatin 40 mg, resumed in hospital LDL 62, goal < 70 Continue statin at discharge  Diabetes type II Controlled Home meds: Jardiance 25 mg, metformin 500 mg twice daily, Trulicity HgbA1c 6.4, goal < 7.0 CBGs SSI Recommend close follow-up with PCP for better DM control  Dysphagia Patient has post-stroke dysphagia, SLP consulted    Diet   Diet regular Room service appropriate? Yes; Fluid consistency: Thin   Advance diet as tolerated  Other Stroke Risk Factors Congestive heart failure Obstructive sleep apnea, on CPAP at home  Other Active Problems Thyroidism GERD Vitamin D deficiency  Neurology will sign off please call with questions or concerns   Hospital day # 1  Gevena Mart DNP, ACNPC-AG  [facility]  I have personally obtained history,examined this patient, reviewed notes, independently viewed imaging studies, participated in medical decision making and plan of care.ROS completed by me personally and pertinent positives fully documented  I have made any additions or clarifications directly to the above note. Agree with note above.  Continue aspirin and Brilinta for 4  weeks followed by aspirin alone and aggressive risk factor modification.  Long discussion with patient and husband at the bedside and answered questions.  Stroke team will sign off.  Kindly call for questions.  Delia Heady, MD Medical Director [facility] Pager: [phone] 05/02/2023 3:18 PM   To contact Stroke Continuity provider, please refer to [email or web address]. After hours, contact General Neurology

## 2023-05-02 NOTE — Progress Notes (Addendum)
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  She was brought to the emergency room for further evaluation.  Code stroke was called.  Neurology consulted.  MRI of the head showed possible acute small vessel infarcts.  Stroke workup initiated and completed .  Started on aspirin and Brilinta.  Medically stable for discharge to SNF whenever possible  Assessment & Plan:  Principal Problem:   Lethargy Active Problems:   Erythrocytosis   TIA (transient ischemic attack)   UTI (urinary tract infection)   Acute ischemic stroke: Presented with lethargy, sleepiness, weakness.  MRI showed bilateral abnormal diffusion restriction within the globi pallidi with possibility of acute small vessel infarcts,Old right pontine and right centrum semiovale infarcts.  CT angio head and neck showed No emergent large vessel occlusion,high-grade stenosis of a posterosuperior left M3 branch vessel,high-grade stenosis of the proximal right V4 segment, extensive atherosclerotic disease.  Neurology consulted.  Stroke workup completed.  LDL of 62, A1c of 6.4.  Echo showed EF of 60 to 65%, grade 1 diastolic dysfunction, no intracardiac was emboli.  PT/OT/speech done.  Patient has history of prior stroke and is weak on the left side.  Currently living in a nursing facility.  Takes aspirin, Zocor as an outpatient.  She remains weak on the left side and has mild left facial droop but her speech is clear.  She is not confused. Plan is to continue aspirin Brilinta for 4 weeks then aspirin alone.  PT/OT recommending SNF  Suspected urinary tract infection: No fever or leukocytosis.  Empirically started on ceftriaxone.  Urine culture showed less than 1000  colonies, antibiotics discontinued  Hypothyroidism/abnormal thyroid hormones: High TSH and free T4.  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  Continue home levothyroxine dose  Diabetes type 2: Continue sliding-scale insulin.  Monitor blood sugars also takes Jardiance, Trulicity, metformin.  A1c of 6.4  Hypertension: Currently blood pressure stable. Resumed ARB  Hyperlipidemia: On Zetia, Zocor  Hypokalemia: Supplemented with potassium          DVT prophylaxis:enoxaparin (LOVENOX) injection 40 mg Start: 04/30/23 2200 SCD's Start: 04/30/23 1911     Code Status: Full Code  Family Communication: Family at bedside  Patient status:Inpatient  Patient is from :SNF  Anticipated discharge to:SNF  Estimated DC date:whenever possible,likely tomorrow   Consultants: Neurology  Procedures:None  Antimicrobials:  Anti-infectives (From admission, onward)    Start     Dose/Rate Route Frequency Ordered Stop   05/01/23 1000  cefTRIAXone (ROCEPHIN) 1 g in sodium chloride 0.9 % 100 mL IVPB        1 g 200 mL/hr over 30 Minutes Intravenous Every 24 hours 04/30/23 1953 05/04/23 0959   04/30/23 1815  cefTRIAXone (ROCEPHIN) 1 g in sodium chloride 0.9 % 100 mL IVPB        1 g 200 mL/hr over 30 Minutes Intravenous  Once 04/30/23 1808 04/30/23 2019       Subjective: Patient seen and examined at bedside today.  Hemodynamically stable comfortable.  Lying in bed.  No new complaints.  Family at bedside   Objective: Vitals:   05/01/23 2009 05/01/23 2343 05/02/23 0430 05/02/23 0750  BP: (!) 160/77 (!) 163/72 (!) 165/72 (!) 157/78  Pulse: 70 71 70 72  Resp: 18 19 19    Temp:  97.9 F (36.6 C) 97.7 F (36.5 C) 97.7 F (36.5 C) 98 F (36.7 C)  TempSrc: Oral Oral Oral Oral  SpO2: 94% 95% 94% 96%  Weight:      Height:        Intake/Output Summary (Last 24 hours) at 05/02/2023 1134 Last data filed at 05/02/2023 0900 Gross per 24 hour  Intake 524.74 ml  Output 1110 ml  Net -585.26 ml    Filed Weights   04/30/23 1432 04/30/23 1450  Weight: 80.4 kg 80.4 kg    Examination:  General exam: Overall comfortable, not in distress, obese, deconditioned HEENT: PERRL, left facial droop Respiratory system:  no wheezes or crackles  Cardiovascular system: S1 & S2 heard, RRR.  Gastrointestinal system: Abdomen is nondistended, soft and nontender. Central nervous system: Alert and oriented, left hemiparesis Extremities: No edema, no clubbing ,no cyanosis Skin: No rashes, no ulcers,no icterus     Data Reviewed: I have personally reviewed following labs and imaging studies  CBC: Recent Labs  Lab 04/30/23 1426 04/30/23 1436 04/30/23 2215 05/02/23 0235  WBC 12.4*  --  10.2 8.5  NEUTROABS 9.5*  --   --   --   HGB 15.9* 16.3* 13.2 14.1  HCT 49.0* 48.0* 40.0 43.4  MCV 95.5  --  96.6 94.6  PLT 269  --  226 234   Basic Metabolic Panel: Recent Labs  Lab 04/30/23 1426 04/30/23 1436 04/30/23 2215 05/02/23 0235  NA 135 136  --  139  K 3.4* 3.5  --  3.3*  CL 101 102  --  104  CO2 23  --   --  24  GLUCOSE 124* 120*  --  116*  BUN 9 9  --  6*  CREATININE 0.62 0.40* 0.60 0.54  CALCIUM 9.8  --   --  9.7     Recent Results (from the past 240 hour(s))  Urine Culture     Status: Abnormal   Collection Time: 04/30/23  6:08 PM   Specimen: Urine, Clean Catch  Result Value Ref Range Status   Specimen Description URINE, CLEAN CATCH  Final   Special Requests NONE  Final   Culture (A)  Final    <10,000 COLONIES/mL INSIGNIFICANT GROWTH Performed at [facility], [address] [place], [place] [address]    Report Status 05/02/2023 FINAL  Final  Culture, blood (Routine X 2) w Reflex to ID Panel     Status: None (Preliminary result)   Collection Time: 04/30/23 10:23 PM   Specimen: BLOOD  Result Value Ref Range Status   Specimen Description BLOOD BLOOD RIGHT ARM  Final   Special Requests   Final    BOTTLES DRAWN AEROBIC ONLY Blood Culture adequate volume   Culture    Final    NO GROWTH < 12 HOURS Performed at [facility], [address] [place], [place] [address]    Report Status PENDING  Incomplete  Culture, blood (Routine X 2) w Reflex to ID Panel     Status: None (Preliminary result)   Collection Time: 04/30/23 10:25 PM   Specimen: BLOOD LEFT ARM  Result Value Ref Range Status   Specimen Description BLOOD LEFT ARM  Final   Special Requests   Final    BOTTLES DRAWN AEROBIC ONLY Blood Culture adequate volume   Culture   Final    NO GROWTH < 12 HOURS Performed at [facility], [address] [place], [place] [address]  Report Status PENDING  Incomplete     Radiology Studies: ECHOCARDIOGRAM COMPLETE BUBBLE STUDY  Result Date: 05/01/2023    ECHOCARDIOGRAM REPORT   Patient Name:   SENETRA MANFRE Essman Date of Exam: 05/01/2023 Medical Rec #:  [id number]              Height:       65.0 in Accession #:    [id number]             Weight:       177.2 lb Date of Birth:  Apr 08, 1951              BSA:          1.879 m Patient Age:    72 years               BP:           159/67 mmHg Patient Gender: F                      HR:           81 bpm. Exam Location:  Inpatient Procedure: 2D Echo and Saline Contrast Bubble Study Indications:    stroke  History:        Patient has prior history of Echocardiogram examinations, most                 recent 10/14/2022. Arrythmias:PVC; Risk Factors:Hypertension,                 Dyslipidemia and Sleep Apnea.  Sonographer:    Delcie Roch RDCS Referring Phys: [id number] [facility] GOEL  Sonographer Comments: Image acquisition challenging due to respiratory motion. IMPRESSIONS  1. Left ventricular ejection fraction, by estimation, is 60 to 65%. The left ventricle has normal function. The left ventricle has no regional wall motion abnormalities. There is severe asymmetric left ventricular hypertrophy of the basal-septal segment. Left ventricular diastolic parameters are consistent with Grade I diastolic dysfunction (impaired  relaxation).  2. Right ventricular systolic function is hyperdynamic. The right ventricular size is normal.  3. The mitral valve is degenerative. No evidence of mitral valve regurgitation.  4. The aortic valve was not well visualized. Aortic valve regurgitation is not visualized.  5. The inferior vena cava is normal in size with greater than 50% respiratory variability, suggesting right atrial pressure of 3 mmHg.  6. Agitated saline contrast bubble study was negative, with no evidence of any interatrial shunt. Comparison(s): No significant change from prior study. Prior images reviewed side by side. FINDINGS  Left Ventricle: Small intracavitary gradient. Left ventricular ejection fraction, by estimation, is 60 to 65%. The left ventricle has normal function. The left ventricle has no regional wall motion abnormalities. The left ventricular internal cavity size was normal in size. There is severe asymmetric left ventricular hypertrophy of the basal-septal segment. Left ventricular diastolic parameters are consistent with Grade I diastolic dysfunction (impaired relaxation). Right Ventricle: The right ventricular size is normal. No increase in right ventricular wall thickness. Right ventricular systolic function is hyperdynamic. Left Atrium: Left atrial size was normal in size. Right Atrium: Right atrial size was normal in size. Pericardium: Trivial pericardial effusion is present. The pericardial effusion is anterior to the right ventricle. Presence of epicardial fat layer. Mitral Valve: The mitral valve is degenerative in appearance. Mild mitral annular calcification. No evidence of mitral valve regurgitation. Tricuspid Valve: The tricuspid valve is normal in structure. Tricuspid valve regurgitation is not demonstrated. Aortic Valve: The aortic  valve was not well visualized. Aortic valve regurgitation is not visualized. Aortic valve mean gradient measures 16.0 mmHg. Aortic valve peak gradient measures 26.8 mmHg.  Aortic valve area, by VTI measures 1.36 cm. Pulmonic Valve: The pulmonic valve was grossly normal. Pulmonic valve regurgitation is not visualized. No evidence of pulmonic stenosis. Aorta: The aortic root, ascending aorta and aortic arch are all structurally normal, with no evidence of dilitation or obstruction. Venous: The inferior vena cava is normal in size with greater than 50% respiratory variability, suggesting right atrial pressure of 3 mmHg. IAS/Shunts: No atrial level shunt detected by color flow Doppler. Agitated saline contrast was given intravenously to evaluate for intracardiac shunting. Agitated saline contrast bubble study was negative, with no evidence of any interatrial shunt.  LEFT VENTRICLE PLAX 2D LVIDd:         3.60 cm   Diastology LVIDs:         2.40 cm   LV e' medial:    4.79 cm/s LV PW:         1.10 cm   LV E/e' medial:  22.1 LV IVS:        1.50 cm   LV e' lateral:   4.90 cm/s LVOT diam:     1.90 cm   LV E/e' lateral: 21.6 LV SV:         66 LV SV Index:   35 LVOT Area:     2.84 cm  RIGHT VENTRICLE             IVC RV Basal diam:  2.10 cm     IVC diam: 1.80 cm RV S prime:     13.60 cm/s TAPSE (M-mode): 2.0 cm LEFT ATRIUM             Index        RIGHT ATRIUM           Index LA diam:        3.90 cm 2.08 cm/m   RA Area:     11.60 cm LA Vol (A2C):   47.4 ml 25.22 ml/m  RA Volume:   25.70 ml  13.68 ml/m LA Vol (A4C):   59.5 ml 31.66 ml/m LA Biplane Vol: 57.8 ml 30.76 ml/m  AORTIC VALVE AV Area (Vmax):    1.30 cm AV Area (Vmean):   1.18 cm AV Area (VTI):     1.36 cm AV Vmax:           259.00 cm/s AV Vmean:          191.000 cm/s AV VTI:            0.482 m AV Peak Grad:      26.8 mmHg AV Mean Grad:      16.0 mmHg LVOT Vmax:         118.50 cm/s LVOT Vmean:        79.600 cm/s LVOT VTI:          0.232 m LVOT/AV VTI ratio: 0.48  AORTA Ao Root diam: 2.70 cm Ao Asc diam:  3.10 cm MITRAL VALVE MV Area (PHT): 3.99 cm     SHUNTS MV Decel Time: 190 msec     Systemic VTI:  0.23 m MV E velocity: 106.00  cm/s  Systemic Diam: 1.90 cm MV A velocity: 147.00 cm/s MV E/A ratio:  0.72 Riley Lam MD Electronically signed by Riley Lam MD Signature Date/Time: 05/01/2023/12:39:13 PM    Final    MR BRAIN WO CONTRAST  Result Date:  04/30/2023 CLINICAL DATA:  Acute neurologic deficit EXAM: MRI HEAD WITHOUT CONTRAST TECHNIQUE: Multiplanar, multiecho pulse sequences of the brain and surrounding structures were obtained without intravenous contrast. COMPARISON:  None Available. FINDINGS: Brain: There is bilateral abnormal diffusion restriction of the globi pallidi. There are old right pontine and right centrum semiovale infarcts. No acute or chronic hemorrhage. There is multifocal hyperintense T2-weighted signal within the white matter. Generalized volume loss. A partially empty sella is incidentally noted. Vascular: Major flow voids are preserved. Skull and upper cervical spine: Normal calvarium and skull base. Visualized upper cervical spine and soft tissues are normal. Sinuses/Orbits:No paranasal sinus fluid levels or advanced mucosal thickening. No mastoid or middle ear effusion. Normal orbits. IMPRESSION: 1. Bilateral abnormal diffusion restriction within the globi pallidi. While these may be acute small vessel infarcts, the bilaterality suggests other possibilities such as toxin exposure or drug use. 2. Old right pontine and right centrum semiovale infarcts. Electronically Signed   By: Deatra Robinson M.D.   On: 04/30/2023 21:19   DG CHEST PORT 1 VIEW  Result Date: 04/30/2023 CLINICAL DATA:  Altered mental status EXAM: PORTABLE CHEST 1 VIEW COMPARISON:  None Available. FINDINGS: Transverse diameter of heart is slightly increased. There are no signs of pulmonary edema or focal pulmonary consolidation. There is no pleural effusion or pneumothorax. IMPRESSION: No active cardiopulmonary disease. Electronically Signed   By: Ernie Avena M.D.   On: 04/30/2023 16:35   CT ANGIO HEAD NECK W WO CM (CODE  STROKE)  Result Date: 04/30/2023 CLINICAL DATA:  Neuro deficit, acute, stroke suspected. EXAM: CT ANGIOGRAPHY HEAD AND NECK WITH AND WITHOUT CONTRAST TECHNIQUE: Multidetector CT imaging of the head and neck was performed using the standard protocol during bolus administration of intravenous contrast. Multiplanar CT image reconstructions and MIPs were obtained to evaluate the vascular anatomy. Carotid stenosis measurements (when applicable) are obtained utilizing NASCET criteria, using the distal internal carotid diameter as the denominator. RADIATION DOSE REDUCTION: This exam was performed according to the departmental dose-optimization program which includes automated exposure control, adjustment of the mA and/or kV according to patient size and/or use of iterative reconstruction technique. CONTRAST:  75mL OMNIPAQUE IOHEXOL 350 MG/ML SOLN COMPARISON:  CT head without contrast 04/30/2023. MR head without contrast 10/13/2022. FINDINGS: CTA NECK FINDINGS Aortic arch: A 3 vessel arch configuration is present. Atherosclerotic calcifications are present at the aortic arch and at the origin the left subclavian artery without significant stenosis. Right carotid system: The right common carotid artery within normal limits. Calcified and noncalcified plaque is present at the for carotid bifurcation. The minimal transverse diameter is 2 mm. This compares with a more normal distal right ICA of 4 mm. No tandem stenoses are present. Left carotid system: The left common carotid artery is within normal limits apart from minimal mural calcification just proximal to the bifurcation. Atherosclerotic changes are present at the carotid bifurcation and proximal left ICA without significant stenosis relative to the more distal vessel. Mural calcifications are noted along the cervical left ICA without focal stenosis. Vertebral arteries: The left vertebral artery is the dominant vessel. Dense calcifications are present at the origin of  the left vertebral artery without significant stenosis. The right vertebral artery is hypoplastic. No significant stenosis is present in either vertebral artery in the neck. Skeleton: The vertebral body heights and alignment are normal. Mild rightward curvature is present in the cervical spine. No focal osseous lesions are present. Other neck: Soft tissues the neck are otherwise unremarkable. Salivary glands are within  normal limits. Thyroid is normal. No significant adenopathy is present. No focal mucosal or submucosal lesions are present Upper chest: The lung apices are clear. The thoracic inlet is within normal limits. Review of the MIP images confirms the above findings CTA HEAD FINDINGS Anterior circulation: Atherosclerotic calcifications are present within the cavernous internal carotid arteries bilaterally without a significant stenosis relative to the more distal vessel. The ICA termini are normal bilaterally. The A1 and M1 segments are normal. No significant anterior communicating artery is present. The MCA bifurcations are within normal limits bilaterally. Segmental narrowing is present within the distal ACA and MCA branch vessels. The most significant stenosis is a high-grade stenosis of a posterosuperior left M3 branch. Contrast enhancement is present distal to the stenosis. Posterior circulation: Extensive atherosclerotic changes are present at the dural margin of the left vertebral artery. High-grade stenosis is present in the proximal right V4 segment. The left PICA origin is visualized and normal. The vertebrobasilar junction is normal. Basilar artery is normal. The right superior cerebellar artery is duplicated. The superior cerebellar arteries are patent bilaterally. The proximal PCA vessels are within normal limits bilaterally. There is some segmental narrowing of distal branches. Venous sinuses: The dural sinuses are patent. The straight sinus and deep cerebral veins are intact. Cortical veins  are within normal limits. No significant vascular malformation is evident. Anatomic variants: None Review of the MIP images confirms the above findings IMPRESSION: 1. No emergent large vessel occlusion. 2. High-grade stenosis of a posterosuperior left M3 branch vessel. 3. Distal vessel irregularity within the Circle of Willis consistent with intracranial atherosclerosis. 4. Extensive atherosclerotic changes at the dural margin of the left vertebral artery. 5. High-grade stenosis of the proximal right V4 segment. 6. Atherosclerotic changes at the carotid bifurcations and cavernous internal carotid arteries bilaterally without significant stenosis relative to the more distal vessels. 7.  Aortic Atherosclerosis (ICD10-I70.0). Electronically Signed   By: Marin Roberts M.D.   On: 04/30/2023 14:59   CT HEAD CODE STROKE WO CONTRAST  Result Date: 04/30/2023 CLINICAL DATA:  Code stroke.  Neuro deficit, acute, stroke suspected EXAM: CT HEAD WITHOUT CONTRAST TECHNIQUE: Contiguous axial images were obtained from the base of the skull through the vertex without intravenous contrast. RADIATION DOSE REDUCTION: This exam was performed according to the departmental dose-optimization program which includes automated exposure control, adjustment of the mA and/or kV according to patient size and/or use of iterative reconstruction technique. COMPARISON:  CT head 10/12/22 FINDINGS: Brain: Sequela of moderate chronic microvascular ischemic change with chronic infarcts in the bilateral corona radiata and basal ganglia. Compared to prior exam there is a new infarct in the right thalamus. There is also a new hypodensity in the upper pons, which may be artifactual. No hemorrhage. No hydrocephalus. No extra-axial fluid collection. Generalized volume loss. Vascular: No hyperdense vessel or unexpected calcification. Skull: Normal. Negative for fracture or focal lesion. Sinuses/Orbits: No middle ear or mastoid effusion. Paranasal sinuses  are clear. Orbits are unremarkable. Other: None. ASPECTS [facility] Stroke Program Early CT Score): 10 when accounting for chronic findings IMPRESSION: 1. Compared to prior exam there is a new infarct in the right thalamus. There is also a new hypodensity in the upper pons, which may be artifactual. If there is concern for acute infarct, recommend MRI for further evaluation. 2. Sequela of moderate chronic microvascular ischemic change with chronic infarcts in the bilateral corona radiata and basal ganglia. Findings were paged to Dr. Roda Shutters on 04/30/23 at 2:50 PM via [facility] paging system.  Electronically Signed   By: Lorenza Cambridge M.D.   On: 04/30/2023 14:51    Scheduled Meds:  aspirin EC  81 mg Oral Daily   empagliflozin  25 mg Oral Daily   enoxaparin (LOVENOX) injection  40 mg Subcutaneous Q24H   ezetimibe  10 mg Oral QHS   insulin aspart  0-15 Units Subcutaneous TID WC   insulin aspart  0-5 Units Subcutaneous QHS   levothyroxine  100 mcg Oral Q0600   simvastatin  40 mg Oral q1800   ticagrelor  90 mg Oral BID   Continuous Infusions:  sodium chloride Stopped (05/01/23 1555)   cefTRIAXone (ROCEPHIN)  IV 1 g (05/02/23 0950)     LOS: 1 day   Burnadette Pop, MD [facility] P8/07/2023, 11:34 AM

## 2023-05-02 NOTE — TOC Initial Note (Signed)
Transition of Care [facility]) - Initial/Assessment Note    Patient Details  Name: Kimberly Barker MRN: [id number] Date of Birth: [birth year]  Transition of Care [facility]) CM/SW Contact:    Deatra Robinson, [place] Phone Number: 05/02/2023, 11:50 AM  Clinical Narrative:  pt admitted from [facility]. Spoke to [place] at [employer] who confirmed pt is a Ambulance person resident and able to return at [facility] pending new auth. PT/OT recs for SNF noted. Home and [facility] auth started via portal. Per MD, plan for dc Monday pending auth.   Dellie Burns, MSW, LCSW [phone] (coverage)     Expected Discharge Plan: Skilled Nursing Facility Barriers to Discharge: Insurance Authorization, Continued Medical Work up   Patient Goals and CMS Choice            Expected Discharge Plan and Services                                              Prior Living Arrangements/Services              Need for Family Participation in Patient Care: Yes (Comment) Care giver support system in place?: Yes (comment)   Criminal Activity/Legal Involvement Pertinent to Current Situation/Hospitalization: No - Comment as needed  Activities of Daily Living Home Assistive Devices/Equipment: Environmental consultant (specify type) Designer, multimedia) ADL Screening (condition at time of admission) Patient's cognitive ability adequate to safely complete daily activities?: Yes Is the patient deaf or have difficulty hearing?: No Does the patient have difficulty seeing, even when wearing glasses/contacts?: No Does the patient have difficulty concentrating, remembering, or making decisions?: No Patient able to express need for assistance with ADLs?: Yes Does the patient have difficulty dressing or bathing?: Yes Independently performs ADLs?: No Communication: Independent Dressing (OT): Needs assistance Is this a change from baseline?: Pre-admission baseline Grooming: Needs assistance Is this a change from baseline?:  Pre-admission baseline Feeding: Independent Bathing: Needs assistance Is this a change from baseline?: Pre-admission baseline Toileting: Needs assistance Is this a change from baseline?: Pre-admission baseline In/Out Bed: Needs assistance Is this a change from baseline?: Pre-admission baseline Walks in Home: Needs assistance Is this a change from baseline?: Pre-admission baseline Does the patient have difficulty walking or climbing stairs?: Yes Weakness of Legs: Both Weakness of Arms/Hands: Both  Permission Sought/Granted                  Emotional Assessment       Orientation: : Oriented to Self, Oriented to Place, Oriented to  Time, Oriented to Situation Alcohol / Substance Use: Not Applicable Psych Involvement: No (comment)  Admission diagnosis:  TIA (transient ischemic attack) [G45.9] Severe comorbid illness [R69] Altered mental status, unspecified altered mental status type [R41.82] Patient Active Problem List   Diagnosis Date Noted   TIA (transient ischemic attack) 04/30/2023   Lethargy 04/30/2023   UTI (urinary tract infection) 04/30/2023   Nausea and vomiting 10/13/2022   Diarrhea 10/13/2022   Normal anion gap metabolic acidosis 10/13/2022   Hypercalcemia 10/13/2022   Hyponatremia 10/13/2022   Erythrocytosis 10/13/2022   Thrombocytosis 10/13/2022   Abnormal EKG 10/13/2022   Essential hypertension 10/13/2022   Hypothyroidism 10/13/2022   Acute metabolic encephalopathy 10/13/2022   Acute encephalopathy 10/12/2022   B12 deficiency anemia 03/09/2022    Class: Diagnosis of   Deficiency anemia 11/04/2021    Class: Chronic   Iron deficiency anemia  09/03/2021    Class: Chronic   Non-insulin dependent type 2 diabetes mellitus (HCC) 05/08/2009   DYSLIPIDEMIA 05/07/2009   OBSTRUCTIVE SLEEP APNEA 05/07/2009   ALLERGIC RHINITIS 05/07/2009   PCP:  Olive Bass, MD Pharmacy:   [facility] - [place], [place] - [address] [place]  [address] Phone: [phone] Fax: [phone]     Social Determinants of Health (SDOH) Social History: SDOH Screenings   Food Insecurity: No Food Insecurity (04/30/2023)  Housing: Low Risk  (04/30/2023)  Transportation Needs: No Transportation Needs (04/30/2023)  Utilities: Not At Risk (04/30/2023)  Financial Resource Strain: Low Risk  (04/06/2023)   Received from [facility]  Tobacco Use: Low Risk  (04/30/2023)   SDOH Interventions:     Readmission Risk Interventions     No data to display
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
## 2023-05-03 DIAGNOSIS — R5383 Other fatigue: Secondary | ICD-10-CM | POA: Diagnosis not present

## 2023-05-03 LAB — GLUCOSE, CAPILLARY
Glucose-Capillary: 137 mg/dL — ABNORMAL HIGH (ref 70–99)
Glucose-Capillary: 84 mg/dL (ref 70–99)

## 2023-05-03 MED ORDER — GABAPENTIN 300 MG PO CAPS: 300.0000 mg | ORAL_CAPSULE | Freq: Every day | ORAL | Status: AC

## 2023-05-03 MED ORDER — TICAGRELOR 90 MG PO TABS: 90.0000 mg | ORAL_TABLET | Freq: Two times a day (BID) | ORAL | 0 refills | Status: AC

## 2023-05-03 MED ORDER — METOPROLOL TARTRATE 25 MG PO TABS: 12.5000 mg | ORAL_TABLET | Freq: Two times a day (BID) | ORAL | Status: AC

## 2023-05-03 NOTE — Discharge Summary (Signed)
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  She was brought to the emergency room for further evaluation.  Code stroke was called.  Neurology consulted.  MRI of the head showed possible acute small vessel infarcts.  Stroke workup initiated and completed .  Started on aspirin and Brilinta.  Medically stable for discharge to SNF whenever possible   Following problems were addressed during the hospitalization: Acute ischemic stroke: Presented with lethargy, sleepiness, weakness.  MRI showed bilateral abnormal diffusion restriction within the globi pallidi with possibility of acute small vessel infarcts,Old right pontine and right centrum semiovale infarcts.  CT angio head and neck showed No emergent large vessel occlusion,high-grade stenosis of a posterosuperior left M3 branch vessel,high-grade stenosis of the proximal right V4 segment, extensive atherosclerotic disease.  Neurology consulted.  Stroke workup completed.  LDL of 62, A1c of 6.4.  Echo showed EF of 60 to 65%, grade 1 diastolic dysfunction, no intracardiac was emboli.  PT/OT/speech done.  Patient has history of prior stroke and is weak on the left side.  Currently living in a nursing facility.  Takes aspirin, Zocor as an outpatient.  She remains weak on the left side and has mild left facial droop but her speech is clear.  She is not confused. Plan is to continue aspirin Brilinta for 4 weeks then aspirin alone.  PT/OT recommending SNF   Suspected urinary tract infection: No  fever or leukocytosis.  Empirically started on ceftriaxone.  Urine culture showed less than 1000 colonies, antibiotics discontinued   Hypothyroidism/abnormal thyroid hormones: High TSH and free T4.  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  Continue home levothyroxine dose   Diabetes type 2: Monitor blood sugars also takes Jardiance, Trulicity, metformin.  A1c of 6.4   Hypertension: Currently blood pressure stable. Resumed ARB,metoprolol   Hyperlipidemia: On Zetia, Zocor   Hypokalemia: Supplemented with potassium      Discharge Diagnoses:  Principal Problem:   Lethargy Active Problems:   Erythrocytosis   TIA (transient ischemic attack)   UTI (urinary tract infection)    Discharge Instructions  Discharge Instructions     Ambulatory referral to Neurology   Complete by: As directed    An appointment is requested in approximately: 4 weeks   Diet - low sodium heart healthy   Complete by: As directed    Discharge instructions   Complete by: As directed    1)Please take prescribed medications as instructed 2)Follow up with neurology as outpatient in 4 weeks. Name and number of the provider group has been attached   Increase activity slowly   Complete by: As directed       Allergies as of 05/03/2023       Reactions   Atorvastatin Other (See Comments)   Myalgia Myalgia    On 80 mg, not 40 mg On 80 mg, not 40 mg   Other Other (See Comments)   Rosuvastatin Other (See Comments)   Myalgias (intolerance) Other reaction(s): Myalgias (intolerance) Tried lower dose    Tried lower dose Tried lower dose        Medication List     STOP taking these medications  azithromycin 500 MG tablet Commonly known as: ZITHROMAX   dexamethasone 4 MG tablet Commonly known as: DECADRON   dexamethasone 6 MG tablet Commonly known as: DECADRON   levofloxacin 750 MG tablet Commonly known as: LEVAQUIN   meloxicam 15 MG tablet Commonly known as: MOBIC       TAKE these  medications    acetaminophen 325 MG tablet Commonly known as: TYLENOL Take 650 mg by mouth every 4 (four) hours as needed for moderate pain.   aspirin EC 81 MG tablet Take 1 tablet (81 mg total) by mouth daily. Swallow whole.   calcium carbonate 1500 (600 Ca) MG Tabs tablet Commonly known as: OSCAL Take 600 mg by mouth 2 (two) times daily.   cholecalciferol 25 MCG (1000 UNIT) tablet Commonly known as: VITAMIN D3 Take 1,000 Units by mouth in the morning and at bedtime.   cyanocobalamin 1000 MCG/ML injection Commonly known as: VITAMIN B12 Inject 1,000 mcg into the muscle every 30 (thirty) days.   Cymbalta 30 MG capsule Generic drug: DULoxetine Take 30 mg by mouth daily.   dextromethorphan-guaiFENesin 30-600 MG 12hr tablet Commonly known as: MUCINEX DM Take 1 tablet by mouth 2 (two) times daily.   empagliflozin 25 MG Tabs tablet Commonly known as: JARDIANCE Take 25 mg by mouth daily.   ezetimibe 10 MG tablet Commonly known as: ZETIA Take 10 mg by mouth at bedtime.   gabapentin 300 MG capsule Commonly known as: NEURONTIN Take 1 capsule (300 mg total) by mouth daily. What changed:  how much to take when to take this   ipratropium-albuterol 0.5-2.5 (3) MG/3ML Soln Commonly known as: DUONEB Take 3 mLs by nebulization every 6 (six) hours as needed (for COVID for 10 days).   irbesartan 150 MG tablet Commonly known as: AVAPRO Take 150 mg by mouth daily.   levothyroxine 100 MCG tablet Commonly known as: SYNTHROID Take 100 mcg by mouth daily.   metFORMIN 500 MG 24 hr tablet Commonly known as: GLUCOPHAGE-XR Take 1,000 mg by mouth 2 (two) times daily.   metoprolol tartrate 25 MG tablet Commonly known as: LOPRESSOR Take 0.5 tablets (12.5 mg total) by mouth 2 (two) times daily.   MILK OF MAGNESIA PO Take 30 mLs by mouth daily.   primidone 50 MG tablet Commonly known as: MYSOLINE Take 50 mg by mouth at bedtime.   simvastatin 40 MG tablet Commonly known as:  ZOCOR Take 40 mg by mouth daily at 6 PM.   ticagrelor 90 MG Tabs tablet Commonly known as: BRILINTA Take 1 tablet (90 mg total) by mouth 2 (two) times daily for 26 days.   Trulicity 3 MG/0.5ML Sopn Generic drug: Dulaglutide Inject 3 mg into the muscle every Wednesday.        Contact information for follow-up providers     [facility]. Schedule an appointment as soon as possible for a visit in 4 week(s).   Specialty: Neurology Contact information: [address] [place] [address] [phone]             Contact information for after-discharge care     Destination     [facility] Preferred SNF .   Service: Skilled Nursing Contact information: [address] [place] [address] [phone]                    Allergies  Allergen Reactions   Atorvastatin Other (See Comments)    Myalgia  Myalgia  On 80 mg, not 40 mg  On 80 mg, not 40 mg   Other Other (See Comments)   Rosuvastatin Other (See Comments)    Myalgias (intolerance)  Other reaction(s): Myalgias (intolerance) Tried lower dose    Tried lower dose  Tried lower dose    Consultations: Neurology   Procedures/Studies: ECHOCARDIOGRAM COMPLETE BUBBLE STUDY  Result Date: 05/01/2023    ECHOCARDIOGRAM REPORT   Patient Name:   Kimberly Barker Reffner Date of Exam: 05/01/2023 Medical Rec #:  [id number]              Height:       65.0 in Accession #:    [id number]             Weight:       177.2 lb Date of Birth:  04-11-1951              BSA:          1.879 m Patient Age:    72 years               BP:           159/67 mmHg Patient Gender: F                      HR:           81 bpm. Exam Location:  Inpatient Procedure: 2D Echo and Saline Contrast Bubble Study Indications:    stroke  History:        Patient has prior history of Echocardiogram examinations, most                 recent 10/14/2022.  Arrythmias:PVC; Risk Factors:Hypertension,                 Dyslipidemia and Sleep Apnea.  Sonographer:    Delcie Roch RDCS Referring Phys: [id number] [facility] GOEL  Sonographer Comments: Image acquisition challenging due to respiratory motion. IMPRESSIONS  1. Left ventricular ejection fraction, by estimation, is 60 to 65%. The left ventricle has normal function. The left ventricle has no regional wall motion abnormalities. There is severe asymmetric left ventricular hypertrophy of the basal-septal segment. Left ventricular diastolic parameters are consistent with Grade I diastolic dysfunction (impaired relaxation).  2. Right ventricular systolic function is hyperdynamic. The right ventricular size is normal.  3. The mitral valve is degenerative. No evidence of mitral valve regurgitation.  4. The aortic valve was not well visualized. Aortic valve regurgitation is not visualized.  5. The inferior vena cava is normal in size with greater than 50% respiratory variability, suggesting right atrial pressure of 3 mmHg.  6. Agitated saline contrast bubble study was negative, with no evidence of any interatrial shunt. Comparison(s): No significant change from prior study. Prior images reviewed side by side. FINDINGS  Left Ventricle: Small intracavitary gradient. Left ventricular ejection fraction, by estimation, is 60 to 65%. The left ventricle has normal function. The left ventricle has no regional wall motion abnormalities. The left ventricular internal cavity size was normal in size. There is severe asymmetric left ventricular hypertrophy of the basal-septal segment. Left ventricular diastolic parameters are consistent with Grade I diastolic dysfunction (impaired relaxation). Right Ventricle: The right ventricular size is normal. No increase in right ventricular wall thickness. Right ventricular systolic function is hyperdynamic. Left Atrium: Left atrial size was normal in size. Right Atrium: Right atrial size was normal  in size. Pericardium: Trivial pericardial effusion is present. The pericardial effusion is anterior to  the right ventricle. Presence of epicardial fat layer. Mitral Valve: The mitral valve is degenerative in appearance. Mild mitral annular calcification. No evidence of mitral valve regurgitation. Tricuspid Valve: The tricuspid valve is normal in structure. Tricuspid valve regurgitation is not demonstrated. Aortic Valve: The aortic valve was not well visualized. Aortic valve regurgitation is not visualized. Aortic valve mean gradient measures 16.0 mmHg. Aortic valve peak gradient measures 26.8 mmHg. Aortic valve area, by VTI measures 1.36 cm. Pulmonic Valve: The pulmonic valve was grossly normal. Pulmonic valve regurgitation is not visualized. No evidence of pulmonic stenosis. Aorta: The aortic root, ascending aorta and aortic arch are all structurally normal, with no evidence of dilitation or obstruction. Venous: The inferior vena cava is normal in size with greater than 50% respiratory variability, suggesting right atrial pressure of 3 mmHg. IAS/Shunts: No atrial level shunt detected by color flow Doppler. Agitated saline contrast was given intravenously to evaluate for intracardiac shunting. Agitated saline contrast bubble study was negative, with no evidence of any interatrial shunt.  LEFT VENTRICLE PLAX 2D LVIDd:         3.60 cm   Diastology LVIDs:         2.40 cm   LV e' medial:    4.79 cm/s LV PW:         1.10 cm   LV E/e' medial:  22.1 LV IVS:        1.50 cm   LV e' lateral:   4.90 cm/s LVOT diam:     1.90 cm   LV E/e' lateral: 21.6 LV SV:         66 LV SV Index:   35 LVOT Area:     2.84 cm  RIGHT VENTRICLE             IVC RV Basal diam:  2.10 cm     IVC diam: 1.80 cm RV S prime:     13.60 cm/s TAPSE (M-mode): 2.0 cm LEFT ATRIUM             Index        RIGHT ATRIUM           Index LA diam:        3.90 cm 2.08 cm/m   RA Area:     11.60 cm LA Vol (A2C):   47.4 ml 25.22 ml/m  RA Volume:   25.70 ml  13.68  ml/m LA Vol (A4C):   59.5 ml 31.66 ml/m LA Biplane Vol: 57.8 ml 30.76 ml/m  AORTIC VALVE AV Area (Vmax):    1.30 cm AV Area (Vmean):   1.18 cm AV Area (VTI):     1.36 cm AV Vmax:           259.00 cm/s AV Vmean:          191.000 cm/s AV VTI:            0.482 m AV Peak Grad:      26.8 mmHg AV Mean Grad:      16.0 mmHg LVOT Vmax:         118.50 cm/s LVOT Vmean:        79.600 cm/s LVOT VTI:          0.232 m LVOT/AV VTI ratio: 0.48  AORTA Ao Root diam: 2.70 cm Ao Asc diam:  3.10 cm MITRAL VALVE MV Area (PHT): 3.99 cm     SHUNTS MV Decel Time: 190 msec     Systemic VTI:  0.23 m MV E  velocity: 106.00 cm/s  Systemic Diam: 1.90 cm MV A velocity: 147.00 cm/s MV E/A ratio:  0.72 Riley Lam MD Electronically signed by Riley Lam MD Signature Date/Time: 05/01/2023/12:39:13 PM    Final    MR BRAIN WO CONTRAST  Result Date: 04/30/2023 CLINICAL DATA:  Acute neurologic deficit EXAM: MRI HEAD WITHOUT CONTRAST TECHNIQUE: Multiplanar, multiecho pulse sequences of the brain and surrounding structures were obtained without intravenous contrast. COMPARISON:  None Available. FINDINGS: Brain: There is bilateral abnormal diffusion restriction of the globi pallidi. There are old right pontine and right centrum semiovale infarcts. No acute or chronic hemorrhage. There is multifocal hyperintense T2-weighted signal within the white matter. Generalized volume loss. A partially empty sella is incidentally noted. Vascular: Major flow voids are preserved. Skull and upper cervical spine: Normal calvarium and skull base. Visualized upper cervical spine and soft tissues are normal. Sinuses/Orbits:No paranasal sinus fluid levels or advanced mucosal thickening. No mastoid or middle ear effusion. Normal orbits. IMPRESSION: 1. Bilateral abnormal diffusion restriction within the globi pallidi. While these may be acute small vessel infarcts, the bilaterality suggests other possibilities such as toxin exposure or drug use. 2. Old  right pontine and right centrum semiovale infarcts. Electronically Signed   By: Deatra Robinson M.D.   On: 04/30/2023 21:19   DG CHEST PORT 1 VIEW  Result Date: 04/30/2023 CLINICAL DATA:  Altered mental status EXAM: PORTABLE CHEST 1 VIEW COMPARISON:  None Available. FINDINGS: Transverse diameter of heart is slightly increased. There are no signs of pulmonary edema or focal pulmonary consolidation. There is no pleural effusion or pneumothorax. IMPRESSION: No active cardiopulmonary disease. Electronically Signed   By: Ernie Avena M.D.   On: 04/30/2023 16:35   CT ANGIO HEAD NECK W WO CM (CODE STROKE)  Result Date: 04/30/2023 CLINICAL DATA:  Neuro deficit, acute, stroke suspected. EXAM: CT ANGIOGRAPHY HEAD AND NECK WITH AND WITHOUT CONTRAST TECHNIQUE: Multidetector CT imaging of the head and neck was performed using the standard protocol during bolus administration of intravenous contrast. Multiplanar CT image reconstructions and MIPs were obtained to evaluate the vascular anatomy. Carotid stenosis measurements (when applicable) are obtained utilizing NASCET criteria, using the distal internal carotid diameter as the denominator. RADIATION DOSE REDUCTION: This exam was performed according to the departmental dose-optimization program which includes automated exposure control, adjustment of the mA and/or kV according to patient size and/or use of iterative reconstruction technique. CONTRAST:  75mL OMNIPAQUE IOHEXOL 350 MG/ML SOLN COMPARISON:  CT head without contrast 04/30/2023. MR head without contrast 10/13/2022. FINDINGS: CTA NECK FINDINGS Aortic arch: A 3 vessel arch configuration is present. Atherosclerotic calcifications are present at the aortic arch and at the origin the left subclavian artery without significant stenosis. Right carotid system: The right common carotid artery within normal limits. Calcified and noncalcified plaque is present at the for carotid bifurcation. The minimal transverse  diameter is 2 mm. This compares with a more normal distal right ICA of 4 mm. No tandem stenoses are present. Left carotid system: The left common carotid artery is within normal limits apart from minimal mural calcification just proximal to the bifurcation. Atherosclerotic changes are present at the carotid bifurcation and proximal left ICA without significant stenosis relative to the more distal vessel. Mural calcifications are noted along the cervical left ICA without focal stenosis. Vertebral arteries: The left vertebral artery is the dominant vessel. Dense calcifications are present at the origin of the left vertebral artery without significant stenosis. The right vertebral artery is hypoplastic. No significant stenosis is  present in either vertebral artery in the neck. Skeleton: The vertebral body heights and alignment are normal. Mild rightward curvature is present in the cervical spine. No focal osseous lesions are present. Other neck: Soft tissues the neck are otherwise unremarkable. Salivary glands are within normal limits. Thyroid is normal. No significant adenopathy is present. No focal mucosal or submucosal lesions are present Upper chest: The lung apices are clear. The thoracic inlet is within normal limits. Review of the MIP images confirms the above findings CTA HEAD FINDINGS Anterior circulation: Atherosclerotic calcifications are present within the cavernous internal carotid arteries bilaterally without a significant stenosis relative to the more distal vessel. The ICA termini are normal bilaterally. The A1 and M1 segments are normal. No significant anterior communicating artery is present. The MCA bifurcations are within normal limits bilaterally. Segmental narrowing is present within the distal ACA and MCA branch vessels. The most significant stenosis is a high-grade stenosis of a posterosuperior left M3 branch. Contrast enhancement is present distal to the stenosis. Posterior circulation:  Extensive atherosclerotic changes are present at the dural margin of the left vertebral artery. High-grade stenosis is present in the proximal right V4 segment. The left PICA origin is visualized and normal. The vertebrobasilar junction is normal. Basilar artery is normal. The right superior cerebellar artery is duplicated. The superior cerebellar arteries are patent bilaterally. The proximal PCA vessels are within normal limits bilaterally. There is some segmental narrowing of distal branches. Venous sinuses: The dural sinuses are patent. The straight sinus and deep cerebral veins are intact. Cortical veins are within normal limits. No significant vascular malformation is evident. Anatomic variants: None Review of the MIP images confirms the above findings IMPRESSION: 1. No emergent large vessel occlusion. 2. High-grade stenosis of a posterosuperior left M3 branch vessel. 3. Distal vessel irregularity within the Circle of Willis consistent with intracranial atherosclerosis. 4. Extensive atherosclerotic changes at the dural margin of the left vertebral artery. 5. High-grade stenosis of the proximal right V4 segment. 6. Atherosclerotic changes at the carotid bifurcations and cavernous internal carotid arteries bilaterally without significant stenosis relative to the more distal vessels. 7.  Aortic Atherosclerosis (ICD10-I70.0). Electronically Signed   By: Marin Roberts M.D.   On: 04/30/2023 14:59   CT HEAD CODE STROKE WO CONTRAST  Result Date: 04/30/2023 CLINICAL DATA:  Code stroke.  Neuro deficit, acute, stroke suspected EXAM: CT HEAD WITHOUT CONTRAST TECHNIQUE: Contiguous axial images were obtained from the base of the skull through the vertex without intravenous contrast. RADIATION DOSE REDUCTION: This exam was performed according to the departmental dose-optimization program which includes automated exposure control, adjustment of the mA and/or kV according to patient size and/or use of iterative  reconstruction technique. COMPARISON:  CT head 10/12/22 FINDINGS: Brain: Sequela of moderate chronic microvascular ischemic change with chronic infarcts in the bilateral corona radiata and basal ganglia. Compared to prior exam there is a new infarct in the right thalamus. There is also a new hypodensity in the upper pons, which may be artifactual. No hemorrhage. No hydrocephalus. No extra-axial fluid collection. Generalized volume loss. Vascular: No hyperdense vessel or unexpected calcification. Skull: Normal. Negative for fracture or focal lesion. Sinuses/Orbits: No middle ear or mastoid effusion. Paranasal sinuses are clear. Orbits are unremarkable. Other: None. ASPECTS [facility] Stroke Program Early CT Score): 10 when accounting for chronic findings IMPRESSION: 1. Compared to prior exam there is a new infarct in the right thalamus. There is also a new hypodensity in the upper pons, which may be artifactual. If  there is concern for acute infarct, recommend MRI for further evaluation. 2. Sequela of moderate chronic microvascular ischemic change with chronic infarcts in the bilateral corona radiata and basal ganglia. Findings were paged to Dr. Roda Shutters on 04/30/23 at 2:50 PM via [facility] paging system. Electronically Signed   By: Lorenza Cambridge M.D.   On: 04/30/2023 14:51      Subjective: Patient seen and examined at bedside today.  Hemodynamically stable.  Comfortable.  Complains of mild headache today.  Husband at bedside.  Medically stable for discharge to SNF today  Discharge Exam: Vitals:   05/03/23 0400 05/03/23 0813  BP: (!) 143/77 (!) 155/84  Pulse: 74 79  Resp: 20 20  Temp: (!) 97.5 F (36.4 C) 97.9 F (36.6 C)  SpO2: 94% 97%   Vitals:   05/02/23 1610 05/02/23 2000 05/03/23 0400 05/03/23 0813  BP: (!) 152/85 (!) 148/88 (!) 143/77 (!) 155/84  Pulse: 82 85 74 79  Resp:  20 20 20   Temp: 98.2 F (36.8 C) 98.5 F (36.9 C) (!) 97.5 F (36.4 C) 97.9 F (36.6 C)  TempSrc: Oral Oral Oral Oral  SpO2:  95%  94% 97%  Weight:      Height:        General: Pt is alert, awake, not in acute distress Cardiovascular: RRR, S1/S2 +, no rubs, no gallops Respiratory: CTA bilaterally, no wheezing, no rhonchi Abdominal: Soft, NT, ND, bowel sounds + Extremities: no edema, no cyanosis, left hemiparesis    The results of significant diagnostics from this hospitalization (including imaging, microbiology, ancillary and laboratory) are listed below for reference.     Microbiology: Recent Results (from the past 240 hour(s))  Urine Culture     Status: Abnormal   Collection Time: 04/30/23  6:08 PM   Specimen: Urine, Clean Catch  Result Value Ref Range Status   Specimen Description URINE, CLEAN CATCH  Final   Special Requests NONE  Final   Culture (A)  Final    <10,000 COLONIES/mL INSIGNIFICANT GROWTH Performed at [facility], [address], [place], [place] [address]    Report Status 05/02/2023 FINAL  Final  Culture, blood (Routine X 2) w Reflex to ID Panel     Status: None (Preliminary result)   Collection Time: 04/30/23 10:23 PM   Specimen: BLOOD  Result Value Ref Range Status   Specimen Description BLOOD BLOOD RIGHT ARM  Final   Special Requests   Final    BOTTLES DRAWN AEROBIC ONLY Blood Culture adequate volume   Culture   Final    NO GROWTH 3 DAYS Performed at [facility], [address], [place], [place] [address]    Report Status PENDING  Incomplete  Culture, blood (Routine X 2) w Reflex to ID Panel     Status: None (Preliminary result)   Collection Time: 04/30/23 10:25 PM   Specimen: BLOOD LEFT ARM  Result Value Ref Range Status   Specimen Description BLOOD LEFT ARM  Final   Special Requests   Final    BOTTLES DRAWN AEROBIC ONLY Blood Culture adequate volume   Culture   Final    NO GROWTH 3 DAYS Performed at [facility], [address], [place], [place] [address]    Report Status PENDING  Incomplete     Labs: BNP (last 3 results) No results for  input(s): "BNP" in the last 8760 hours. Basic Metabolic Panel: Recent Labs  Lab 04/30/23 1426 04/30/23 1436 04/30/23 2215 05/02/23 0235  NA 135 136  --  139  K 3.4* 3.5  --  3.3*  CL 101 102  --  104  CO2 23  --   --  24  GLUCOSE 124* 120*  --  116*  BUN 9 9  --  6*  CREATININE 0.62 0.40* 0.60 0.54  CALCIUM 9.8  --   --  9.7   Liver Function Tests: Recent Labs  Lab 04/30/23 1426  AST 18  ALT 26  ALKPHOS 63  BILITOT 1.1  PROT 6.1*  ALBUMIN 3.4*   No results for input(s): "LIPASE", "AMYLASE" in the last 168 hours. Recent Labs  Lab 04/30/23 2215  AMMONIA 13   CBC: Recent Labs  Lab 04/30/23 1426 04/30/23 1436 04/30/23 2215 05/02/23 0235  WBC 12.4*  --  10.2 8.5  NEUTROABS 9.5*  --   --   --   HGB 15.9* 16.3* 13.2 14.1  HCT 49.0* 48.0* 40.0 43.4  MCV 95.5  --  96.6 94.6  PLT 269  --  226 234   Cardiac Enzymes: No results for input(s): "CKTOTAL", "CKMB", "CKMBINDEX", "TROPONINI" in the last 168 hours. BNP: Invalid input(s): "POCBNP" CBG: Recent Labs  Lab 05/02/23 0631 05/02/23 1206 05/02/23 1735 05/02/23 2127 05/03/23 0618  GLUCAP 119* 99 132* 129* 137*   D-Dimer No results for input(s): "DDIMER" in the last 72 hours. Hgb A1c Recent Labs    05/01/23 0322  HGBA1C 6.4*   Lipid Profile Recent Labs    05/01/23 0322  CHOL 114  HDL 32*  LDLCALC 62  TRIG 98  CHOLHDL 3.6   Thyroid function studies Recent Labs    05/01/23 0322  TSH 4.993*   Anemia work up No results for input(s): "VITAMINB12", "FOLATE", "FERRITIN", "TIBC", "IRON", "RETICCTPCT" in the last 72 hours. Urinalysis    Component Value Date/Time   COLORURINE STRAW (A) 04/30/2023 1645   APPEARANCEUR CLEAR 04/30/2023 1645   LABSPEC >1.046 (H) 04/30/2023 1645   PHURINE 5.0 04/30/2023 1645   GLUCOSEU >=500 (A) 04/30/2023 1645   HGBUR MODERATE (A) 04/30/2023 1645   BILIRUBINUR NEGATIVE 04/30/2023 1645   KETONESUR NEGATIVE 04/30/2023 1645   PROTEINUR NEGATIVE 04/30/2023 1645    NITRITE NEGATIVE 04/30/2023 1645   LEUKOCYTESUR MODERATE (A) 04/30/2023 1645   Sepsis Labs Recent Labs  Lab 04/30/23 1426 04/30/23 2215 05/02/23 0235  WBC 12.4* 10.2 8.5   Microbiology Recent Results (from the past 240 hour(s))  Urine Culture     Status: Abnormal   Collection Time: 04/30/23  6:08 PM   Specimen: Urine, Clean Catch  Result Value Ref Range Status   Specimen Description URINE, CLEAN CATCH  Final   Special Requests NONE  Final   Culture (A)  Final    <10,000 COLONIES/mL INSIGNIFICANT GROWTH Performed at [facility], [address] [place], [place] [address]    Report Status 05/02/2023 FINAL  Final  Culture, blood (Routine X 2) w Reflex to ID Panel     Status: None (Preliminary result)   Collection Time: 04/30/23 10:23 PM   Specimen: BLOOD  Result Value Ref Range Status   Specimen Description BLOOD BLOOD RIGHT ARM  Final   Special Requests   Final    BOTTLES DRAWN AEROBIC ONLY Blood Culture adequate volume   Culture   Final    NO GROWTH 3 DAYS Performed at [facility], [address] [place], [place] [address]    Report Status PENDING  Incomplete  Culture, blood (Routine X 2)  w Reflex to ID Panel     Status: None (Preliminary result)   Collection Time: 04/30/23 10:25 PM   Specimen: BLOOD LEFT ARM  Result Value Ref Range Status   Specimen Description BLOOD LEFT ARM  Final   Special Requests   Final    BOTTLES DRAWN AEROBIC ONLY Blood Culture adequate volume   Culture   Final    NO GROWTH 3 DAYS Performed at [facility], [address], [place], [place] [address]    Report Status PENDING  Incomplete    Please note: You were cared for by a hospitalist during your hospital stay. Once you are discharged, your primary care physician will handle any further medical issues. Please note that NO REFILLS for any discharge medications will be authorized once you are discharged, as it is imperative that you return to your primary care physician  (or establish a relationship with a primary care physician if you do not have one) for your post hospital discharge needs so that they can reassess your need for medications and monitor your lab values.    Time coordinating discharge: 40 minutes  SIGNED:   Burnadette Pop, MD  [facility] 05/03/2023, 10:58 AM Pager [phone]  If 7PM-7AM, please contact night-coverage [email or web address] Password [email or web address]

## 2023-05-03 NOTE — TOC Transition Note (Signed)
Transition of Care [facility]) - CM/SW Discharge Note   Patient Details  Name: Kimberly Barker MRN: [id number] Date of Birth: Jan 02, 1951  [facility]) CM/SW Contact:  Baldemar Lenis, LCSW Phone Number: 05/03/2023, 4:13 PM   Clinical Narrative:   CSW attempted to locate patient's insurance authorization review, it was not properly requested over the weekend. CSW asked CMA to initiate insurance authorization, and authorization was received. CSW previously met with patient and spouse at bedside, family will provide transportation for patient. CSW confirmed with [facility] that they are ready to admit patient.  Nurse to call report to [phone]    Final next level of care: Skilled Nursing Facility Barriers to Discharge: Barriers Resolved   Patient Goals and CMS Choice      Discharge Placement                Patient chooses bed at: [facility], [place] Patient to be transferred to facility by: Family Name of family member notified: Self, family Patient and family notified of of transfer: 05/03/23  Discharge Plan and Services Additional resources added to the After Visit Summary for                                       Social Determinants of Health (SDOH) Interventions SDOH Screenings   Food Insecurity: No Food Insecurity (04/30/2023)  Housing: Low Risk  (04/30/2023)  Transportation Needs: No Transportation Needs (04/30/2023)  Utilities: Not At Risk (04/30/2023)  Financial Resource Strain: Low Risk  (04/06/2023)   Received from [facility]  Tobacco Use: Low Risk  (04/30/2023)     Readmission Risk Interventions     No data to display

## 2023-05-17 ENCOUNTER — Encounter: Payer: Self-pay | Admitting: Oncology

## 2023-05-17 NOTE — Addendum Note (Signed)
Addended by: Domenic Schwab on: 05/17/2023 04:13 PM   Modules accepted: Orders

## 2023-05-18 ENCOUNTER — Inpatient Hospital Stay: Payer: Medicare PPO

## 2023-06-14 ENCOUNTER — Encounter: Payer: Self-pay | Admitting: Oncology

## 2023-06-14 NOTE — Addendum Note (Signed)
Addended by: Domenic Schwab on: 06/14/2023 04:18 PM   Modules accepted: Orders

## 2023-06-15 ENCOUNTER — Inpatient Hospital Stay: Payer: Medicare PPO | Attending: Oncology

## 2023-07-12 ENCOUNTER — Encounter: Payer: Self-pay | Admitting: Oncology

## 2023-07-13 ENCOUNTER — Inpatient Hospital Stay: Payer: Medicare PPO | Attending: Oncology

## 2023-07-13 VITALS — BP 126/74 | HR 68 | Temp 97.4°F | Resp 18

## 2023-07-13 DIAGNOSIS — Z23 Encounter for immunization: Secondary | ICD-10-CM | POA: Insufficient documentation

## 2023-07-13 DIAGNOSIS — D509 Iron deficiency anemia, unspecified: Secondary | ICD-10-CM | POA: Insufficient documentation

## 2023-07-13 DIAGNOSIS — E538 Deficiency of other specified B group vitamins: Secondary | ICD-10-CM | POA: Insufficient documentation

## 2023-07-13 MED ORDER — INFLUENZA VAC A&B SURF ANT ADJ 0.5 ML IM SUSY
0.5000 mL | PREFILLED_SYRINGE | Freq: Once | INTRAMUSCULAR | Status: AC
  Administered 2023-07-13: 0.5 mL via INTRAMUSCULAR
  Filled 2023-07-13: qty 0.5

## 2023-07-13 MED ORDER — CYANOCOBALAMIN 1000 MCG/ML IJ SOLN
1000.0000 ug | Freq: Once | INTRAMUSCULAR | Status: AC
  Administered 2023-07-13: 1000 ug via INTRAMUSCULAR
  Filled 2023-07-13: qty 1

## 2023-07-13 NOTE — Patient Instructions (Signed)
Influenza Vaccine Injection What is this medication? INFLUENZA VACCINE (in floo EN zuh vak SEEN) reduces the risk of the influenza (flu). It does not treat influenza. It is still possible to get influenza after receiving this vaccine, but the symptoms may be less severe or not last as long. It works by helping your immune system learn how to fight off a future infection. This medicine may be used for other purposes; ask your health care provider or pharmacist if you have questions. COMMON BRAND NAME(S): Afluria Quadrivalent, FLUAD Quadrivalent, Fluarix Quadrivalent, Flublok Quadrivalent, FLUCELVAX Quadrivalent, Flulaval Quadrivalent, Fluzone Quadrivalent What should I tell my care team before I take this medication? They need to know if you have any of these conditions: Bleeding disorder like hemophilia Fever or infection Guillain-Barre syndrome or other neurological problems Immune system problems Infection with the human immunodeficiency virus (HIV) or AIDS Low blood platelet counts Multiple sclerosis An unusual or allergic reaction to influenza virus vaccine, latex, other medications, foods, dyes, or preservatives. Different brands of vaccines contain different allergens. Some may contain latex or eggs. Talk to your care team about your allergies to make sure that you get the right vaccine. Pregnant or trying to get pregnant Breastfeeding How should I use this medication? This vaccine is injected into a muscle or under the skin. It is given by your care team. A copy of Vaccine Information Statements will be given before each vaccination. Be sure to read this sheet carefully each time. This sheet may change often. Talk to your care team to see which vaccines are right for you. Some vaccines should not be used in all age groups. Overdosage: If you think you have taken too much of this medicine contact a poison control center or emergency room at once. NOTE: This medicine is only for you. Do  not share this medicine with others. What if I miss a dose? This does not apply. What may interact with this medication? Certain medications that lower your immune system, such as etanercept, anakinra, infliximab, adalimumab Certain medications that prevent or treat blood clots, such as warfarin Chemotherapy or radiation therapy Phenytoin Steroid medications, such as prednisone or cortisone Theophylline Vaccines This list may not describe all possible interactions. Give your health care provider a list of all the medicines, herbs, non-prescription drugs, or dietary supplements you use. Also tell them if you smoke, drink alcohol, or use illegal drugs. Some items may interact with your medicine. What should I watch for while using this medication? Report any side effects that do not go away with your care team. Call your care team if any unusual symptoms occur within 6 weeks of receiving this vaccine. You may still catch the flu, but the illness is not usually as bad. You cannot get the flu from the vaccine. The vaccine will not protect against colds or other illnesses that may cause fever. The vaccine is needed every year. What side effects may I notice from receiving this medication? Side effects that you should report to your care team as soon as possible: Allergic reactions--skin rash, itching, hives, swelling of the face, lips, tongue, or throat Side effects that usually do not require medical attention (report these to your care team if they continue or are bothersome): Chills Fatigue Headache Joint pain Loss of appetite Muscle pain Nausea Pain, redness, or irritation at injection site This list may not describe all possible side effects. Call your doctor for medical advice about side effects. You may report side effects to FDA at  1-800-FDA-1088. Where should I keep my medication? The vaccine is only given by your care team. It will not be stored at home. NOTE: This sheet is a  summary. It may not cover all possible information. If you have questions about this medicine, talk to your doctor, pharmacist, or health care provider.  2024 Elsevier/Gold Standard (2022-02-17 00:00:00) Vitamin B12 Injection What is this medication? Vitamin B12 (VAHY tuh min B12) prevents and treats low vitamin B12 levels in your body. It is used in people who do not get enough vitamin B12 from their diet or when their digestive tract does not absorb enough. Vitamin B12 plays an important role in maintaining the health of your nervous system and red blood cells. This medicine may be used for other purposes; ask your health care provider or pharmacist if you have questions. COMMON BRAND NAME(S): B-12 Compliance Kit, B-12 Injection Kit, Cyomin, Dodex, LA-12, Nutri-Twelve, Physicians EZ Use B-12, Primabalt, Vitamin Deficiency Injectable System - B12 What should I tell my care team before I take this medication? They need to know if you have any of these conditions: Kidney disease Leber's disease Megaloblastic anemia An unusual or allergic reaction to cyanocobalamin, cobalt, other medications, foods, dyes, or preservatives Pregnant or trying to get pregnant Breast-feeding How should I use this medication? This medication is injected into a muscle or deeply under the skin. It is usually given in a clinic or care team's office. However, your care team may teach you how to inject yourself. Follow all instructions. Talk to your care team about the use of this medication in children. Special care may be needed. Overdosage: If you think you have taken too much of this medicine contact a poison control center or emergency room at once. NOTE: This medicine is only for you. Do not share this medicine with others. What if I miss a dose? If you are given your dose at a clinic or care team's office, call to reschedule your appointment. If you give your own injections, and you miss a dose, take it as soon as  you can. If it is almost time for your next dose, take only that dose. Do not take double or extra doses. What may interact with this medication? Alcohol Colchicine This list may not describe all possible interactions. Give your health care provider a list of all the medicines, herbs, non-prescription drugs, or dietary supplements you use. Also tell them if you smoke, drink alcohol, or use illegal drugs. Some items may interact with your medicine. What should I watch for while using this medication? Visit your care team regularly. You may need blood work done while you are taking this medication. You may need to follow a special diet. Talk to your care team. Limit your alcohol intake and avoid smoking to get the best benefit. What side effects may I notice from receiving this medication? Side effects that you should report to your care team as soon as possible: Allergic reactions--skin rash, itching, hives, swelling of the face, lips, tongue, or throat Swelling of the ankles, hands, or feet Trouble breathing Side effects that usually do not require medical attention (report to your care team if they continue or are bothersome): Diarrhea This list may not describe all possible side effects. Call your doctor for medical advice about side effects. You may report side effects to FDA at 1-800-FDA-1088. Where should I keep my medication? Keep out of the reach of children. Store at room temperature between 15 and 30 degrees C (59  and 85 degrees F). Protect from light. Throw away any unused medication after the expiration date. NOTE: This sheet is a summary. It may not cover all possible information. If you have questions about this medicine, talk to your doctor, pharmacist, or health care provider.  2024 Elsevier/Gold Standard (2021-05-20 00:00:00)

## 2023-07-29 NOTE — Progress Notes (Incomplete)
ADDENDUM: Even though her hemoglobin is stable at 15, her B12 level has dropped down to 224 and I think we will continue to do so without supplement.  I have recommended that she resume monthly B12 injections.  Her iron level is also low and so I recommend IV iron again as it has been 1 year since she last required this.  [facility]  [address] [place],  [place]  [address] [phone]   Patient Care Team: Olive Bass, MD as PCP - General (Family Medicine)  Clinic Day:  02/09/23  Referring physician: Olive Bass, MD  ASSESSMENT & PLAN:  Assessment & Plan: Iron deficiency anemia She has only had a partial response to oral supplement despite being on this for 6 months.  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 She received IV iron in June of 2023.  Strong family history for malignancy This is outlined below and includes ovarian and colon cancer.  I do recommend a genetics clinic referral for further evaluation and recommendations.   B12 Deficiency B12 level was 156 on 03/05/22. She stopped monthly B12 injections in January. B12 level for today is pending.   Plan:  She last had IV iron about a year ago in June, 2023. Her WBC was 5.9, hemoglobin 15.2, and platelet count of 295,000 on 10/14/2022. She no longer takes B-12 injections since January so we will recheck her level today. Her labs today are pending and I will add a B-12 to her labs. She has a tremor of both hands but takes medication to ease the symptoms, she also just completed physical therapy.  Her last bone density scan revealed osteopenia on 03/12/2021 and she will be due for a repeat by end of June. She will be due for her next annual mammogram at the end of July. I will see if I can schedule both on the same day.  I will see her back in 6 months with CBC, CMP, B-12, and iron studies. I discussed the  assessment and treatment plan with the patient. The patient was provided an opportunity to ask questions and all were answered.  The patient agreed with the plan and demonstrated an understanding of the instructions.  The patient was advised to call back if the symptoms worsen or if the condition fails to improve as anticipated.  I provided 20 minutes  of face-to-face time during this this encounter and > 50% was spent counseling as documented under my assessment and plan.   [facility] [address] [place] [address] Dept: [phone] Dept Fax: [phone]   No orders of the defined types were placed in this encounter.    CHIEF COMPLAINT:  CC: A 72 y.o. female with history of iron deficiency anemia and B12 deficiency  Current Treatment:  Surveillance  INTERVAL HISTORY:  Shamel is here today for repeat clinical assessment for iron deficiency anemia and B12 deficiency. Patient states that she feels *** and ***.     She denies signs of infection such as sore throat, sinus drainage, cough, or urinary symptoms.  She denies fevers or recurrent chills. She denies pain. She denies nausea, vomiting, chest pain, dyspnea or cough. Her appetite is *** and her weight {Weight change:10426}.  Patient states that she feels ok and complains of lower back  and left hip pain. She last had IV iron about a year ago in June, 2023. Her WBC was 5.9, hemoglobin 15.2, and platelet count of 295,000 on 10/14/2022. She no longer takes B-12 injections since January so we will recheck her level today. Her labs today are pending and I will add a B-12 level to her labs. She has a tremor of both hands but takes medication to ease symptoms, she also just completed physical therapy.  Her last bone density scan revealed osteopenia on 03/12/2021 and she will be due for a repeat by the end of June. She will be due for her next annual  mammogram at the end of July. I will see if I can schedule both on the same day.  I will see her back in 6 months with CBC, CMP, B-12, and iron studies.    I have reviewed the past medical history, past surgical history, social history and family history with the patient and they are unchanged from previous note.  ALLERGIES:  is allergic to atorvastatin, other, and rosuvastatin.  MEDICATIONS:  Current Outpatient Medications  Medication Sig Dispense Refill   acetaminophen (TYLENOL) 325 MG tablet Take 650 mg by mouth every 4 (four) hours as needed for moderate pain.     aspirin EC 81 MG tablet Take 1 tablet (81 mg total) by mouth daily. Swallow whole. 30 tablet 11   calcium carbonate (OSCAL) 1500 (600 Ca) MG TABS tablet Take 600 mg by mouth 2 (two) times daily.  (Patient not taking: Reported on 04/30/2023)     cholecalciferol (VITAMIN D3) 25 MCG (1000 UNIT) tablet Take 1,000 Units by mouth in the morning and at bedtime. (Patient not taking: Reported on 04/30/2023)     cyanocobalamin (VITAMIN B12) 1000 MCG/ML injection Inject 1,000 mcg into the muscle every 30 (thirty) days.     CYMBALTA 30 MG capsule Take 30 mg by mouth daily. (Patient not taking: Reported on 04/30/2023)     dextromethorphan-guaiFENesin (MUCINEX DM) 30-600 MG 12hr tablet Take 1 tablet by mouth 2 (two) times daily.     empagliflozin (JARDIANCE) 25 MG TABS tablet Take 25 mg by mouth daily.     ezetimibe (ZETIA) 10 MG tablet Take 10 mg by mouth at bedtime.     gabapentin (NEURONTIN) 300 MG capsule Take 1 capsule (300 mg total) by mouth daily.     ipratropium-albuterol (DUONEB) 0.5-2.5 (3) MG/3ML SOLN Take 3 mLs by nebulization every 6 (six) hours as needed (for COVID for 10 days).     irbesartan (AVAPRO) 150 MG tablet Take 150 mg by mouth daily.     levothyroxine (SYNTHROID) 100 MCG tablet Take 100 mcg by mouth daily.     Magnesium Hydroxide (MILK OF MAGNESIA PO) Take 30 mLs by mouth daily.     metFORMIN (GLUCOPHAGE-XR) 500 MG 24 hr  tablet Take 1,000 mg by mouth 2 (two) times daily.     metoprolol tartrate (LOPRESSOR) 25 MG tablet Take 0.5 tablets (12.5 mg total) by mouth 2 (two) times daily.     primidone (MYSOLINE) 50 MG tablet Take 50 mg by mouth at bedtime.     simvastatin (ZOCOR) 40 MG tablet Take 40 mg by mouth daily at 6 PM.     TRULICITY 3 MG/0.5ML SOPN Inject 3 mg into the muscle every Wednesday.     No current facility-administered medications for this visit.    HISTORY OF PRESENT ILLNESS:   Oncology History   No history exists.    REVIEW OF  SYSTEMS:  Review of Systems  Constitutional:  Positive for fatigue. Negative for appetite change, chills, diaphoresis, fever and unexpected weight change.  HENT:  Negative.  Negative for hearing loss, lump/mass, mouth sores, nosebleeds, sore throat, tinnitus, trouble swallowing and voice change.   Eyes: Negative.  Negative for eye problems and icterus.  Respiratory: Negative.  Negative for chest tightness, cough, hemoptysis, shortness of breath and wheezing.   Cardiovascular: Negative.  Negative for chest pain, leg swelling and palpitations.  Gastrointestinal: Negative.  Negative for abdominal distention, abdominal pain, blood in stool, constipation, diarrhea, nausea, rectal pain and vomiting.  Endocrine: Negative.   Genitourinary: Negative.  Negative for bladder incontinence, difficulty urinating, dyspareunia, dysuria, frequency, hematuria, menstrual problem, nocturia, pelvic pain, vaginal bleeding and vaginal discharge.   Musculoskeletal:  Positive for back pain (lower back). Negative for arthralgias, flank pain, gait problem, myalgias, neck pain and neck stiffness.       Left hip pain  Skin: Negative.  Negative for itching, rash and wound.  Neurological: Negative.  Negative for dizziness, extremity weakness, gait problem, headaches, light-headedness, numbness, seizures and speech difficulty.  Hematological: Negative.  Negative for adenopathy. Does not bruise/bleed  easily.  Psychiatric/Behavioral: Negative.  Negative for confusion, decreased concentration, depression, sleep disturbance and suicidal ideas. The patient is not nervous/anxious.    VITALS:  Last menstrual period 02/20/2000.  Wt Readings from Last 3 Encounters:  04/30/23 177 lb 4 oz (80.4 kg)  03/04/23 183 lb 0.6 oz (83 kg)  03/02/23 183 lb 1.9 oz (83.1 kg)    There is no height or weight on file to calculate BMI.  Performance status (ECOG): 1 - Symptomatic but completely ambulatory  PHYSICAL EXAM:  Physical Exam Vitals and nursing note reviewed.  Constitutional:      General: She is not in acute distress.    Appearance: Normal appearance. She is normal weight. She is not ill-appearing, toxic-appearing or diaphoretic.  HENT:     Head: Normocephalic and atraumatic.     Right Ear: Tympanic membrane, ear canal and external ear normal. There is no impacted cerumen.     Left Ear: Tympanic membrane, ear canal and external ear normal. There is no impacted cerumen.     Nose: Nose normal. No congestion or rhinorrhea.     Mouth/Throat:     Mouth: Mucous membranes are moist.     Pharynx: Oropharynx is clear. No oropharyngeal exudate or posterior oropharyngeal erythema.  Eyes:     General: No scleral icterus.       Right eye: No discharge.        Left eye: No discharge.     Extraocular Movements: Extraocular movements intact.     Conjunctiva/sclera: Conjunctivae normal.     Pupils: Pupils are equal, round, and reactive to light.  Neck:     Vascular: No carotid bruit.  Cardiovascular:     Rate and Rhythm: Normal rate and regular rhythm.     Pulses: Normal pulses.     Heart sounds: Normal heart sounds. No murmur heard.    No friction rub. No gallop.  Pulmonary:     Effort: Pulmonary effort is normal. No respiratory distress.     Breath sounds: Normal breath sounds. No stridor. No wheezing, rhonchi or rales.  Chest:     Chest wall: No tenderness.  Abdominal:     General: Bowel sounds  are normal. There is no distension.     Palpations: Abdomen is soft. There is no hepatomegaly, splenomegaly or mass.  Tenderness: There is abdominal tenderness in the epigastric area. There is no right CVA tenderness, left CVA tenderness, guarding or rebound.     Hernia: No hernia is present.     Comments: Mild epigastric tenderness.    Musculoskeletal:        General: No swelling, tenderness, deformity or signs of injury. Normal range of motion.     Cervical back: Normal range of motion and neck supple. No rigidity or tenderness.     Right lower leg: No edema.     Left lower leg: No edema.  Lymphadenopathy:     Cervical: No cervical adenopathy.  Skin:    General: Skin is warm and dry.     Coloration: Skin is not jaundiced or pale.     Findings: No bruising, erythema, lesion or rash.  Neurological:     General: No focal deficit present.     Mental Status: She is alert and oriented to person, place, and time. Mental status is at baseline.     Cranial Nerves: No cranial nerve deficit.     Sensory: No sensory deficit.     Motor: Tremor present. No weakness.     Coordination: Coordination normal.     Gait: Gait normal.     Deep Tendon Reflexes: Reflexes normal.     Comments: Tremor of both hands  Psychiatric:        Mood and Affect: Mood normal.        Behavior: Behavior normal.        Thought Content: Thought content normal.        Judgment: Judgment normal.    LABORATORY DATA:  I have reviewed the data as listed      Component Value Date/Time   NA 139 05/02/2023 0235   K 3.3 (L) 05/02/2023 0235   CL 104 05/02/2023 0235   CO2 24 05/02/2023 0235   GLUCOSE 116 (H) 05/02/2023 0235   BUN 6 (L) 05/02/2023 0235   CREATININE 0.54 05/02/2023 0235   CREATININE 0.58 08/10/2022 0955   CALCIUM 9.7 05/02/2023 0235   PROT 6.1 (L) 04/30/2023 1426   ALBUMIN 3.4 (L) 04/30/2023 1426   AST 18 04/30/2023 1426   AST 18 08/10/2022 0955   ALT 26 04/30/2023 1426   ALT 32 08/10/2022 0955    ALKPHOS 63 04/30/2023 1426   BILITOT 1.1 04/30/2023 1426   BILITOT 1.5 (H) 08/10/2022 0955   GFRNONAA >60 05/02/2023 0235   GFRNONAA >60 08/10/2022 0955    No results found for: "SPEP", "UPEP"      Latest Ref Rng & Units 05/02/2023    2:35 AM 04/30/2023   10:15 PM 04/30/2023    2:36 PM  CBC  WBC 4.0 - 10.5 K/uL 8.5  10.2    Hemoglobin 12.0 - 15.0 g/dL 16.1  09.6  04.5   Hematocrit 36.0 - 46.0 % 43.4  40.0  48.0   Platelets 150 - 400 K/uL 234  226       RADIOGRAPHIC STUDIES: I have personally reviewed the radiological images as listed and agreed with the findings in the report.      I,Jasmine M Lassiter,acting as a scribe for Dellia Beckwith, MD.,have documented all relevant documentation on the behalf of Dellia Beckwith, MD,as directed by  Dellia Beckwith, MD while in the presence of Dellia Beckwith, MD.  I have reviewed this report as typed by the medical scribe, and it is complete and accurate.  Jasmine M Lassiter  07/29/23 11:32 AM

## 2023-08-10 ENCOUNTER — Inpatient Hospital Stay: Payer: Medicare PPO | Attending: Oncology

## 2023-08-10 VITALS — BP 121/56 | HR 66 | Temp 98.0°F | Resp 18

## 2023-08-10 DIAGNOSIS — E538 Deficiency of other specified B group vitamins: Secondary | ICD-10-CM | POA: Insufficient documentation

## 2023-08-10 DIAGNOSIS — D509 Iron deficiency anemia, unspecified: Secondary | ICD-10-CM | POA: Insufficient documentation

## 2023-08-10 MED ORDER — CYANOCOBALAMIN 1000 MCG/ML IJ SOLN
1000.0000 ug | Freq: Once | INTRAMUSCULAR | Status: AC
  Administered 2023-08-10: 1000 ug via INTRAMUSCULAR
  Filled 2023-08-10: qty 1

## 2023-08-10 NOTE — Patient Instructions (Signed)
 Vitamin B12 Injection What is this medication? Vitamin B12 (VAHY tuh min B12) prevents and treats low vitamin B12 levels in your body. It is used in people who do not get enough vitamin B12 from their diet or when their digestive tract does not absorb enough. Vitamin B12 plays an important role in maintaining the health of your nervous system and red blood cells. This medicine may be used for other purposes; ask your health care provider or pharmacist if you have questions. COMMON BRAND NAME(S): B-12 Compliance Kit, B-12 Injection Kit, Cyomin, Dodex, LA-12, Nutri-Twelve, Physicians EZ Use B-12, Primabalt, Vitamin Deficiency Injectable System - B12 What should I tell my care team before I take this medication? They need to know if you have any of these conditions: Kidney disease Leber's disease Megaloblastic anemia An unusual or allergic reaction to cyanocobalamin, cobalt, other medications, foods, dyes, or preservatives Pregnant or trying to get pregnant Breast-feeding How should I use this medication? This medication is injected into a muscle or deeply under the skin. It is usually given in a clinic or care team's office. However, your care team may teach you how to inject yourself. Follow all instructions. Talk to your care team about the use of this medication in children. Special care may be needed. Overdosage: If you think you have taken too much of this medicine contact a poison control center or emergency room at once. NOTE: This medicine is only for you. Do not share this medicine with others. What if I miss a dose? If you are given your dose at a clinic or care team's office, call to reschedule your appointment. If you give your own injections, and you miss a dose, take it as soon as you can. If it is almost time for your next dose, take only that dose. Do not take double or extra doses. What may interact with this medication? Alcohol Colchicine This list may not describe all possible  interactions. Give your health care provider a list of all the medicines, herbs, non-prescription drugs, or dietary supplements you use. Also tell them if you smoke, drink alcohol, or use illegal drugs. Some items may interact with your medicine. What should I watch for while using this medication? Visit your care team regularly. You may need blood work done while you are taking this medication. You may need to follow a special diet. Talk to your care team. Limit your alcohol intake and avoid smoking to get the best benefit. What side effects may I notice from receiving this medication? Side effects that you should report to your care team as soon as possible: Allergic reactions--skin rash, itching, hives, swelling of the face, lips, tongue, or throat Swelling of the ankles, hands, or feet Trouble breathing Side effects that usually do not require medical attention (report to your care team if they continue or are bothersome): Diarrhea This list may not describe all possible side effects. Call your doctor for medical advice about side effects. You may report side effects to FDA at 1-800-FDA-1088. Where should I keep my medication? Keep out of the reach of children. Store at room temperature between 15 and 30 degrees C (59 and 85 degrees F). Protect from light. Throw away any unused medication after the expiration date. NOTE: This sheet is a summary. It may not cover all possible information. If you have questions about this medicine, talk to your doctor, pharmacist, or health care provider.  2024 Elsevier/Gold Standard (2021-05-20 00:00:00)

## 2023-08-13 ENCOUNTER — Inpatient Hospital Stay: Payer: Medicare PPO | Admitting: Oncology

## 2023-08-13 ENCOUNTER — Inpatient Hospital Stay: Payer: Medicare PPO

## 2023-09-30 ENCOUNTER — Other Ambulatory Visit: Payer: Self-pay | Admitting: Oncology

## 2023-09-30 ENCOUNTER — Inpatient Hospital Stay: Payer: Medicare PPO | Admitting: Oncology

## 2023-09-30 ENCOUNTER — Inpatient Hospital Stay: Payer: Medicare PPO

## 2023-09-30 ENCOUNTER — Inpatient Hospital Stay: Payer: Medicare PPO | Attending: Oncology

## 2023-09-30 ENCOUNTER — Encounter: Payer: Self-pay | Admitting: Oncology

## 2023-09-30 VITALS — BP 137/72 | HR 79 | Temp 97.9°F | Resp 18 | Ht 65.0 in

## 2023-09-30 DIAGNOSIS — Z780 Asymptomatic menopausal state: Secondary | ICD-10-CM | POA: Diagnosis not present

## 2023-09-30 DIAGNOSIS — D509 Iron deficiency anemia, unspecified: Secondary | ICD-10-CM

## 2023-09-30 DIAGNOSIS — M8580 Other specified disorders of bone density and structure, unspecified site: Secondary | ICD-10-CM

## 2023-09-30 DIAGNOSIS — E538 Deficiency of other specified B group vitamins: Secondary | ICD-10-CM | POA: Diagnosis present

## 2023-09-30 DIAGNOSIS — Z800 Family history of malignant neoplasm of digestive organs: Secondary | ICD-10-CM | POA: Insufficient documentation

## 2023-09-30 DIAGNOSIS — D519 Vitamin B12 deficiency anemia, unspecified: Secondary | ICD-10-CM | POA: Diagnosis not present

## 2023-09-30 DIAGNOSIS — Z1239 Encounter for other screening for malignant neoplasm of breast: Secondary | ICD-10-CM

## 2023-09-30 DIAGNOSIS — Z8041 Family history of malignant neoplasm of ovary: Secondary | ICD-10-CM | POA: Diagnosis not present

## 2023-09-30 LAB — CMP (CANCER CENTER ONLY)
ALT: 17 U/L (ref 0–44)
AST: 16 U/L (ref 15–41)
Albumin: 4.3 g/dL (ref 3.5–5.0)
Alkaline Phosphatase: 114 U/L (ref 38–126)
Anion gap: 10 (ref 5–15)
BUN: 12 mg/dL (ref 8–23)
CO2: 29 mmol/L (ref 22–32)
Calcium: 11.1 mg/dL — ABNORMAL HIGH (ref 8.9–10.3)
Chloride: 104 mmol/L (ref 98–111)
Creatinine: 0.65 mg/dL (ref 0.44–1.00)
GFR, Estimated: >60 mL/min (ref >60)
Glucose, Bld: 140 mg/dL — ABNORMAL HIGH (ref 70–99)
Potassium: 4 mmol/L (ref 3.5–5.1)
Sodium: 143 mmol/L (ref 135–145)
Total Bilirubin: 1 mg/dL (ref 0.0–1.2)
Total Protein: 6.8 g/dL (ref 6.5–8.1)

## 2023-09-30 LAB — IRON AND TIBC
Iron: 113 ug/dL (ref 28–170)
Saturation Ratios: 30 % (ref 10.4–31.8)
TIBC: 372 ug/dL (ref 250–450)
UIBC: 259 ug/dL

## 2023-09-30 LAB — CBC WITH DIFFERENTIAL (CANCER CENTER ONLY)
Abs Immature Granulocytes: 0.02 10*3/uL (ref 0.00–0.07)
Basophils Absolute: 0.1 10*3/uL (ref 0.0–0.1)
Basophils Relative: 1 %
Eosinophils Absolute: 0.1 10*3/uL (ref 0.0–0.5)
Eosinophils Relative: 2 %
HCT: 47.5 % — ABNORMAL HIGH (ref 36.0–46.0)
Hemoglobin: 15.8 g/dL — ABNORMAL HIGH (ref 12.0–15.0)
Immature Granulocytes: 0 %
Lymphocytes Relative: 30 %
Lymphs Abs: 2 10*3/uL (ref 0.7–4.0)
MCH: 30.9 pg (ref 26.0–34.0)
MCHC: 33.3 g/dL (ref 30.0–36.0)
MCV: 92.8 fL (ref 80.0–100.0)
Monocytes Absolute: 0.4 10*3/uL (ref 0.1–1.0)
Monocytes Relative: 6 %
Neutro Abs: 4 10*3/uL (ref 1.7–7.7)
Neutrophils Relative %: 61 %
Platelet Count: 280 10*3/uL (ref 150–400)
RBC: 5.12 MIL/uL — ABNORMAL HIGH (ref 3.87–5.11)
RDW: 12.1 % (ref 11.5–15.5)
WBC Count: 6.6 10*3/uL (ref 4.0–10.5)
nRBC: 0 % (ref 0.0–0.2)
nRBC: 0 /100{WBCs}

## 2023-09-30 LAB — VITAMIN B12: Vitamin B-12: 341 pg/mL (ref 180–914)

## 2023-09-30 LAB — FERRITIN: Ferritin: 354 ng/mL — ABNORMAL HIGH (ref 11–307)

## 2023-09-30 MED ORDER — CYANOCOBALAMIN 1000 MCG/ML IJ SOLN
1000.0000 ug | Freq: Once | INTRAMUSCULAR | Status: AC
  Administered 2023-09-30: 1000 ug via INTRAMUSCULAR
  Filled 2023-09-30: qty 1

## 2023-09-30 NOTE — Patient Instructions (Signed)
 Vitamin B12 Injection What is this medication? Vitamin B12 (VAHY tuh min B12) prevents and treats low vitamin B12 levels in your body. It is used in people who do not get enough vitamin B12 from their diet or when their digestive tract does not absorb enough. Vitamin B12 plays an important role in maintaining the health of your nervous system and red blood cells. This medicine may be used for other purposes; ask your health care provider or pharmacist if you have questions. COMMON BRAND NAME(S): B-12 Compliance Kit, B-12 Injection Kit, Cyomin, Dodex , LA-12, Nutri-Twelve, Physicians EZ Use B-12, Primabalt, Vitamin Deficiency Injectable System - B12 What should I tell my care team before I take this medication? They need to know if you have any of these conditions: Kidney disease Leber's disease Megaloblastic anemia An unusual or allergic reaction to cyanocobalamin , cobalt, other medications, foods, dyes, or preservatives Pregnant or trying to get pregnant Breast-feeding How should I use this medication? This medication is injected into a muscle or deeply under the skin. It is usually given in a clinic or care team's office. However, your care team may teach you how to inject yourself. Follow all instructions. Talk to your care team about the use of this medication in children. Special care may be needed. Overdosage: If you think you have taken too much of this medicine contact a poison control center or emergency room at once. NOTE: This medicine is only for you. Do not share this medicine with others. What if I miss a dose? If you are given your dose at a clinic or care team's office, call to reschedule your appointment. If you give your own injections, and you miss a dose, take it as soon as you can. If it is almost time for your next dose, take only that dose. Do not take double or extra doses. What may interact with this medication? Alcohol Colchicine This list may not describe all possible  interactions. Give your health care provider a list of all the medicines, herbs, non-prescription drugs, or dietary supplements you use. Also tell them if you smoke, drink alcohol, or use illegal drugs. Some items may interact with your medicine. What should I watch for while using this medication? Visit your care team regularly. You may need blood work done while you are taking this medication. You may need to follow a special diet. Talk to your care team. Limit your alcohol intake and avoid smoking to get the best benefit. What side effects may I notice from receiving this medication? Side effects that you should report to your care team as soon as possible: Allergic reactions--skin rash, itching, hives, swelling of the face, lips, tongue, or throat Swelling of the ankles, hands, or feet Trouble breathing Side effects that usually do not require medical attention (report to your care team if they continue or are bothersome): Diarrhea This list may not describe all possible side effects. Call your doctor for medical advice about side effects. You may report side effects to FDA at 1-800-FDA-1088. Where should I keep my medication? Keep out of the reach of children. Store at room temperature between 15 and 30 degrees C (59 and 85 degrees F). Protect from light. Throw away any unused medication after the expiration date. NOTE: This sheet is a summary. It may not cover all possible information. If you have questions about this medicine, talk to your doctor, pharmacist, or health care provider.  2024 Elsevier/Gold Standard (2021-05-20 00:00:00)

## 2023-09-30 NOTE — Progress Notes (Signed)
 [facility] AT [facility]  Patient Care Team: Ofilia Lamar CROME, MD as PCP - General (Family Medicine)  Clinic Day: 09/30/23  Referring physician: Ofilia Lamar CROME, MD  ASSESSMENT & PLAN:  Assessment: Iron  deficiency anemia She has only had a partial response to oral supplement despite being on it for 6 months.  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 She received IV iron  in June of 2023 and again in June, 2024.   Strong family history for malignancy This includes ovarian and colon cancer.  I do recommend a genetics clinic referral for further evaluation and recommendations.   B12 Deficiency B12 level was 156 on 03/05/22. She stopped monthly B12 injections in January and repeat B-12 level was down to 224 and dropping. We therefore have her back on monthly injections as this is most consistent with pernicious anemia. B12 level for today is pending.   Osteopenia Her last bone density was in June, 2022 and I think we need to repeat that this year.   Hypercalcemia She has had this before but I will have her stop her calcium supplement and recheck this in 6 months.   Plan:  She informed me that she started Primidone 50 mg for her tremors and was taking 1 half pill before bedtime, this was recently increased to 1 half pill once in the morning and once before bedtime, which could be the cause of her fatigue. She has a WBC of 5.6, hemoglobin improved from 14.1 to 15.8, and a platelet count of 280,000. Her CMP is normal other than a elevated calcium of 11.1, She currently takes 2 oral calcium supplements and I instructed her to stop this and we might later go to once daily. Her iron  studies and B-12 level today are pending and she receives monthly B-12 injections but missed her December, 2024 injection. She is due for repeat bone density scan and annual mammogram and I will schedule those for her. I will  see her back in 6 months with CBC and CMP. I will schedule bone density scan and bilateral mammogram in February.  I discussed the assessment and treatment plan with the patient. The patient was provided an opportunity to ask questions and all were answered.  The patient agreed with the plan and demonstrated an understanding of the instructions.  The patient was advised to call back if the symptoms worsen or if the condition fails to improve as anticipated.  I provided 15 minutes  of face-to-face time during this this encounter and > 50% was spent counseling as documented under my assessment and plan.   Wanda VEAR Cornish, MD   [facility] AT [facility] [address] [place] [address] Dept: [phone] Dept Fax: [phone]   No orders of the defined types were placed in this encounter.    CHIEF COMPLAINT:  CC: History of iron  deficiency anemia and B12 deficiency  Current Treatment:  Surveillance  INTERVAL HISTORY:  Kimberly Barker is here today for repeat clinical assessment for her history of iron  deficiency anemia and B12 deficiency. Patient states that she feels ok but complains of fatigue. She had 1 fall since her last visit and is now well from this. When we saw her last time in the Summer, 2024, she received IV iron . She informed me that she started Primidone 50 mg for her tremors and was taking 1  half pill before bedtime, this was recently increased to 1 half pill once in the morning and once before bedtime, which could be the cause of her fatigue. She has a WBC of 5.6, hemoglobin improved from 14.1 to 15.8, and a platelet count of 280,000. Her CMP is normal other than a elevated calcium of 11.1, She currently takes 2 oral calcium supplements and I instructed her to stop this and we might later go to once daily. Her iron  studies and B-12 level today are pending and she receives monthly B-12 injections but missed her December, 2024 injection.  She is due for repeat bone density scan and annual mammogram and I will schedule those for her. I will see her back in 6 months with CBC and CMP. I will schedule bone density scan and bilateral mammogram in February.  She denies signs of infection such as sore throat, sinus drainage, cough, or urinary symptoms.  She denies fevers or recurrent chills. She denies pain. She denies nausea, vomiting, chest pain, dyspnea or cough. Her appetite is ok and she could not be weighed today. This patient is accompanied in the office by her husband.    I have reviewed the past medical history, past surgical history, social history and family history with the patient and they are unchanged from previous note.  ALLERGIES:  is allergic to atorvastatin, other, and rosuvastatin.  MEDICATIONS:  Current Outpatient Medications  Medication Sig Dispense Refill   clopidogrel (PLAVIX) 75 MG tablet Take 75 mg by mouth daily.     donepezil (ARICEPT) 5 MG tablet Take by mouth.     primidone (MYSOLINE) 50 MG tablet Take by mouth.     acetaminophen  (TYLENOL ) 325 MG tablet Take 650 mg by mouth every 4 (four) hours as needed for moderate pain.     aspirin  EC 81 MG tablet Take 1 tablet (81 mg total) by mouth daily. Swallow whole. 30 tablet 11   calcium carbonate (OSCAL) 1500 (600 Ca) MG TABS tablet Take 600 mg by mouth 2 (two) times daily.  (Patient not taking: Reported on 04/30/2023)     cholecalciferol (VITAMIN D3) 25 MCG (1000 UNIT) tablet Take 1,000 Units by mouth in the morning and at bedtime. (Patient not taking: Reported on 04/30/2023)     cyanocobalamin  (VITAMIN B12) 1000 MCG/ML injection Inject 1,000 mcg into the muscle every 30 (thirty) days.     CYMBALTA 30 MG capsule Take 30 mg by mouth daily. (Patient not taking: Reported on 04/30/2023)     dextromethorphan -guaiFENesin  (MUCINEX  DM) 30-600 MG 12hr tablet Take 1 tablet by mouth 2 (two) times daily.     empagliflozin  (JARDIANCE ) 25 MG TABS tablet Take 25 mg by mouth daily.      ezetimibe  (ZETIA ) 10 MG tablet Take 10 mg by mouth at bedtime.     gabapentin  (NEURONTIN ) 300 MG capsule Take 1 capsule (300 mg total) by mouth daily.     ipratropium-albuterol  (DUONEB) 0.5-2.5 (3) MG/3ML SOLN Take 3 mLs by nebulization every 6 (six) hours as needed (for COVID for 10 days).     irbesartan  (AVAPRO ) 150 MG tablet Take 150 mg by mouth daily.     levothyroxine  (SYNTHROID ) 100 MCG tablet Take 100 mcg by mouth daily.     Magnesium Hydroxide (MILK OF MAGNESIA PO) Take 30 mLs by mouth daily.     metoprolol  tartrate (LOPRESSOR ) 25 MG tablet Take 0.5 tablets (12.5 mg total) by mouth 2 (two) times daily.     REPATHA SURECLICK 140 MG/ML SOAJ  Inject into the skin.     simvastatin  (ZOCOR ) 40 MG tablet Take 40 mg by mouth daily at 6 PM.     TRULICITY  3 MG/0.5ML SOPN Inject 3 mg into the muscle every Wednesday.     No current facility-administered medications for this visit.    HISTORY OF PRESENT ILLNESS:   Oncology History   No history exists.    REVIEW OF SYSTEMS:  Review of Systems  Constitutional:  Positive for fatigue. Negative for appetite change, chills, diaphoresis, fever and unexpected weight change.  HENT:  Negative.  Negative for hearing loss, lump/mass, mouth sores, nosebleeds, sore throat, tinnitus, trouble swallowing and voice change.   Eyes: Negative.  Negative for eye problems and icterus.  Respiratory: Negative.  Negative for chest tightness, cough, hemoptysis, shortness of breath and wheezing.   Cardiovascular: Negative.  Negative for chest pain, leg swelling and palpitations.  Gastrointestinal: Negative.  Negative for abdominal distention, abdominal pain, blood in stool, constipation, diarrhea, nausea, rectal pain and vomiting.  Endocrine: Negative.   Genitourinary: Negative.  Negative for bladder incontinence, difficulty urinating, dyspareunia, dysuria, frequency, hematuria, menstrual problem, nocturia, pelvic pain, vaginal bleeding and vaginal discharge.    Musculoskeletal:  Positive for back pain (lower back). Negative for arthralgias, flank pain, gait problem, myalgias, neck pain and neck stiffness.       Left hip pain  Skin: Negative.  Negative for itching, rash and wound.  Neurological: Negative.  Negative for dizziness, extremity weakness, gait problem, headaches, light-headedness, numbness, seizures and speech difficulty.       Tremor  Hematological: Negative.  Negative for adenopathy. Does not bruise/bleed easily.  Psychiatric/Behavioral: Negative.  Negative for confusion, decreased concentration, depression, sleep disturbance and suicidal ideas. The patient is not nervous/anxious.    VITALS:  Blood pressure 137/72, pulse 79, temperature 97.9 F (36.6 C), temperature source Oral, resp. rate 18, height 5' 5 (1.651 m), last menstrual period 02/20/2000, SpO2 96%.  Wt Readings from Last 3 Encounters:  04/30/23 177 lb 4 oz (80.4 kg)  03/04/23 183 lb 0.6 oz (83 kg)  03/02/23 183 lb 1.9 oz (83.1 kg)    Body mass index is 29.5 kg/m.  Performance status (ECOG): 1 - Symptomatic but completely ambulatory  PHYSICAL EXAM:  Physical Exam Vitals and nursing note reviewed. Exam conducted with a chaperone present.  Constitutional:      General: She is not in acute distress.    Appearance: Normal appearance. She is normal weight. She is not ill-appearing, toxic-appearing or diaphoretic.  HENT:     Head: Normocephalic and atraumatic.     Right Ear: Tympanic membrane, ear canal and external ear normal. There is no impacted cerumen.     Left Ear: Tympanic membrane, ear canal and external ear normal. There is no impacted cerumen.     Nose: Nose normal. No congestion or rhinorrhea.     Mouth/Throat:     Mouth: Mucous membranes are moist.     Pharynx: Oropharynx is clear. No oropharyngeal exudate or posterior oropharyngeal erythema.  Eyes:     General: No scleral icterus.       Right eye: No discharge.        Left eye: No discharge.      Extraocular Movements: Extraocular movements intact.     Conjunctiva/sclera: Conjunctivae normal.     Pupils: Pupils are equal, round, and reactive to light.  Neck:     Vascular: No carotid bruit.  Cardiovascular:     Rate and Rhythm: Normal rate and  regular rhythm.     Pulses: Normal pulses.     Heart sounds: Normal heart sounds. No murmur heard.    No friction rub. No gallop.  Pulmonary:     Effort: Pulmonary effort is normal. No respiratory distress.     Breath sounds: Normal breath sounds. No stridor. No wheezing, rhonchi or rales.  Chest:     Chest wall: No tenderness.  Abdominal:     General: Bowel sounds are normal. There is no distension.     Palpations: Abdomen is soft. There is no hepatomegaly, splenomegaly or mass.     Tenderness: There is no abdominal tenderness. There is no right CVA tenderness, left CVA tenderness, guarding or rebound.     Hernia: No hernia is present.  Musculoskeletal:        General: No swelling, tenderness, deformity or signs of injury. Normal range of motion.     Cervical back: Normal range of motion and neck supple. No rigidity or tenderness.     Right lower leg: No edema.     Left lower leg: No edema.     Left foot: Swelling present.  Lymphadenopathy:     Cervical: No cervical adenopathy.  Skin:    General: Skin is warm and dry.     Coloration: Skin is not jaundiced or pale.     Findings: No bruising, erythema, lesion or rash.  Neurological:     General: No focal deficit present.     Mental Status: She is alert and oriented to person, place, and time. Mental status is at baseline.     Cranial Nerves: No cranial nerve deficit.     Sensory: No sensory deficit.     Motor: Tremor present. No weakness.     Coordination: Coordination normal.     Gait: Gait normal.     Deep Tendon Reflexes: Reflexes normal.     Comments: Tremor of both hands  Psychiatric:        Mood and Affect: Mood normal.        Behavior: Behavior normal.        Thought  Content: Thought content normal.        Judgment: Judgment normal.    LABORATORY DATA:  I have reviewed the data as listed    Component Value Date/Time   NA 143 09/30/2023 1304   K 4.0 09/30/2023 1304   CL 104 09/30/2023 1304   CO2 29 09/30/2023 1304   GLUCOSE 140 (H) 09/30/2023 1304   BUN 12 09/30/2023 1304   CREATININE 0.65 09/30/2023 1304   CALCIUM 11.1 (H) 09/30/2023 1304   PROT 6.8 09/30/2023 1304   ALBUMIN 4.3 09/30/2023 1304   AST 16 09/30/2023 1304   ALT 17 09/30/2023 1304   ALKPHOS 114 09/30/2023 1304   BILITOT 1.0 09/30/2023 1304   GFRNONAA >60 09/30/2023 1304   No results found for: SPEP, UPEP     Latest Ref Rng & Units 09/30/2023    1:04 PM 05/02/2023    2:35 AM 04/30/2023   10:15 PM  CBC  WBC 4.0 - 10.5 K/uL 6.6  8.5  10.2   Hemoglobin 12.0 - 15.0 g/dL 84.1  85.8  86.7   Hematocrit 36.0 - 46.0 % 47.5  43.4  40.0   Platelets 150 - 400 K/uL 280  234  226    Lab Results  Component Value Date   TSH 4.993 (H) 05/01/2023    RADIOGRAPHIC STUDIES: I have personally reviewed the radiological images as listed and  agreed with the findings in the report.    I,Jasmine M Lassiter,acting as a scribe for Wanda VEAR Cornish, MD.,have documented all relevant documentation on the behalf of Wanda VEAR Cornish, MD,as directed by  Wanda VEAR Cornish, MD while in the presence of Wanda VEAR Cornish, MD.  I have reviewed this report as typed by the medical scribe, and it is complete and accurate.  Wanda VEAR Cornish   10/06/23 8:40 AM

## 2023-10-06 ENCOUNTER — Encounter: Payer: Self-pay | Admitting: Oncology

## 2023-10-06 DIAGNOSIS — M8580 Other specified disorders of bone density and structure, unspecified site: Secondary | ICD-10-CM | POA: Insufficient documentation

## 2023-10-12 ENCOUNTER — Telehealth: Payer: Self-pay

## 2023-10-12 NOTE — Telephone Encounter (Signed)
-----   Message from Dellia Beckwith sent at 10/06/2023  8:52 AM EST ----- Regarding: call Can tell her iron looks great.  B12 is okay but I think she needs to stay on monthly injections

## 2023-10-12 NOTE — Telephone Encounter (Signed)
Attempted to contact patient. No answer and no identifiable VM. 

## 2023-10-19 ENCOUNTER — Other Ambulatory Visit (HOSPITAL_BASED_OUTPATIENT_CLINIC_OR_DEPARTMENT_OTHER): Payer: Medicare PPO | Admitting: Radiology

## 2023-10-19 ENCOUNTER — Inpatient Hospital Stay (HOSPITAL_BASED_OUTPATIENT_CLINIC_OR_DEPARTMENT_OTHER): Admission: RE | Admit: 2023-10-19 | Payer: Medicare PPO | Source: Ambulatory Visit | Admitting: Radiology

## 2023-10-26 ENCOUNTER — Ambulatory Visit (INDEPENDENT_AMBULATORY_CARE_PROVIDER_SITE_OTHER)
Admission: RE | Admit: 2023-10-26 | Discharge: 2023-10-26 | Disposition: A | Discharge status: Home / Self Care | Payer: Medicare PPO | Source: Ambulatory Visit | Attending: Oncology | Admitting: Oncology

## 2023-10-26 ENCOUNTER — Encounter (HOSPITAL_BASED_OUTPATIENT_CLINIC_OR_DEPARTMENT_OTHER): Payer: Self-pay | Admitting: Radiology

## 2023-10-26 DIAGNOSIS — Z780 Asymptomatic menopausal state: Secondary | ICD-10-CM | POA: Diagnosis not present

## 2023-10-26 DIAGNOSIS — Z1239 Encounter for other screening for malignant neoplasm of breast: Secondary | ICD-10-CM | POA: Diagnosis not present

## 2023-10-26 DIAGNOSIS — M8580 Other specified disorders of bone density and structure, unspecified site: Secondary | ICD-10-CM | POA: Diagnosis not present

## 2023-10-28 ENCOUNTER — Inpatient Hospital Stay: Payer: Medicare PPO | Attending: Oncology

## 2023-10-28 VITALS — BP 128/78 | HR 65 | Temp 97.6°F | Resp 18 | Ht 65.0 in

## 2023-10-28 DIAGNOSIS — E538 Deficiency of other specified B group vitamins: Secondary | ICD-10-CM | POA: Insufficient documentation

## 2023-10-28 DIAGNOSIS — D509 Iron deficiency anemia, unspecified: Secondary | ICD-10-CM

## 2023-10-28 MED ORDER — CYANOCOBALAMIN 1000 MCG/ML IJ SOLN
1000.0000 ug | Freq: Once | INTRAMUSCULAR | Status: AC
  Administered 2023-10-28: 1000 ug via INTRAMUSCULAR
  Filled 2023-10-28: qty 1

## 2023-10-28 NOTE — Patient Instructions (Signed)
 Vitamin B12 Injection What is this medication? Vitamin B12 (VAHY tuh min B12) prevents and treats low vitamin B12 levels in your body. It is used in people who do not get enough vitamin B12 from their diet or when their digestive tract does not absorb enough. Vitamin B12 plays an important role in maintaining the health of your nervous system and red blood cells. This medicine may be used for other purposes; ask your health care provider or pharmacist if you have questions. COMMON BRAND NAME(S): B-12 Compliance Kit, B-12 Injection Kit, Cyomin, Dodex , LA-12, Nutri-Twelve, Physicians EZ Use B-12, Primabalt, Vitamin Deficiency Injectable System - B12 What should I tell my care team before I take this medication? They need to know if you have any of these conditions: Kidney disease Leber's disease Megaloblastic anemia An unusual or allergic reaction to cyanocobalamin , cobalt, other medications, foods, dyes, or preservatives Pregnant or trying to get pregnant Breast-feeding How should I use this medication? This medication is injected into a muscle or deeply under the skin. It is usually given in a clinic or care team's office. However, your care team may teach you how to inject yourself. Follow all instructions. Talk to your care team about the use of this medication in children. Special care may be needed. Overdosage: If you think you have taken too much of this medicine contact a poison control center or emergency room at once. NOTE: This medicine is only for you. Do not share this medicine with others. What if I miss a dose? If you are given your dose at a clinic or care team's office, call to reschedule your appointment. If you give your own injections, and you miss a dose, take it as soon as you can. If it is almost time for your next dose, take only that dose. Do not take double or extra doses. What may interact with this medication? Alcohol Colchicine This list may not describe all possible  interactions. Give your health care provider a list of all the medicines, herbs, non-prescription drugs, or dietary supplements you use. Also tell them if you smoke, drink alcohol, or use illegal drugs. Some items may interact with your medicine. What should I watch for while using this medication? Visit your care team regularly. You may need blood work done while you are taking this medication. You may need to follow a special diet. Talk to your care team. Limit your alcohol intake and avoid smoking to get the best benefit. What side effects may I notice from receiving this medication? Side effects that you should report to your care team as soon as possible: Allergic reactions--skin rash, itching, hives, swelling of the face, lips, tongue, or throat Swelling of the ankles, hands, or feet Trouble breathing Side effects that usually do not require medical attention (report to your care team if they continue or are bothersome): Diarrhea This list may not describe all possible side effects. Call your doctor for medical advice about side effects. You may report side effects to FDA at 1-800-FDA-1088. Where should I keep my medication? Keep out of the reach of children. Store at room temperature between 15 and 30 degrees C (59 and 85 degrees F). Protect from light. Throw away any unused medication after the expiration date. NOTE: This sheet is a summary. It may not cover all possible information. If you have questions about this medicine, talk to your doctor, pharmacist, or health care provider.  2024 Elsevier/Gold Standard (2021-05-20 00:00:00)

## 2023-11-11 ENCOUNTER — Telehealth: Payer: Self-pay | Admitting: Oncology

## 2023-11-11 ENCOUNTER — Other Ambulatory Visit: Payer: Self-pay | Admitting: Oncology

## 2023-11-11 DIAGNOSIS — R921 Mammographic calcification found on diagnostic imaging of breast: Secondary | ICD-10-CM

## 2023-11-11 NOTE — Progress Notes (Unsigned)
Pt is needing a diagnostic mammogram per list of abnormal mammos needing follow up from [facility]. Spoke with Lenord Fellers, Dr. Lavonda Jumbo nurse to inform Dr. Gilman Buttner that pt needs additional imaging.

## 2023-11-11 NOTE — Telephone Encounter (Signed)
Contacted pt to notify her of diagnostic mammogram appt, unable to reach via phone.   RE: Diagnostic Mammo order Received: Today Kimberly Beckwith, MD sent to Kimberly Corpus, LPN; Kimberly Petrin, RN; Kimberly Barker Orders in for [name]       Previous Messages    ----- Message ----- From: Kimberly Corpus, LPN Sent: 9/60/4540   9:55 AM EST To: Kimberly Beckwith, MD Subject: Diagnostic Mammo order                        Per Standley Dakins, patient will need a diagnostic mammogram order for either [facility] (scheduling in March) or [facility]

## 2023-11-12 NOTE — Telephone Encounter (Signed)
Spouse notified of Diagnostic Mammogram appt information.

## 2023-11-25 ENCOUNTER — Inpatient Hospital Stay: Payer: Medicare PPO

## 2023-11-25 ENCOUNTER — Inpatient Hospital Stay: Attending: Oncology

## 2023-11-25 VITALS — BP 163/78 | HR 68 | Temp 97.8°F | Resp 18

## 2023-11-25 DIAGNOSIS — E538 Deficiency of other specified B group vitamins: Secondary | ICD-10-CM | POA: Diagnosis present

## 2023-11-25 DIAGNOSIS — D509 Iron deficiency anemia, unspecified: Secondary | ICD-10-CM

## 2023-11-25 MED ORDER — CYANOCOBALAMIN 1000 MCG/ML IJ SOLN
1000.0000 ug | Freq: Once | INTRAMUSCULAR | Status: AC
  Administered 2023-11-25: 1000 ug via INTRAMUSCULAR
  Filled 2023-11-25: qty 1

## 2023-11-25 NOTE — Patient Instructions (Signed)
 Vitamin B12 Injection What is this medication? Vitamin B12 (VAHY tuh min B12) prevents and treats low vitamin B12 levels in your body. It is used in people who do not get enough vitamin B12 from their diet or when their digestive tract does not absorb enough. Vitamin B12 plays an important role in maintaining the health of your nervous system and red blood cells. This medicine may be used for other purposes; ask your health care provider or pharmacist if you have questions. COMMON BRAND NAME(S): B-12 Compliance Kit, B-12 Injection Kit, Cyomin, Dodex, LA-12, Nutri-Twelve, Physicians EZ Use B-12, Primabalt, Vitamin Deficiency Injectable System - B12 What should I tell my care team before I take this medication? They need to know if you have any of these conditions: Kidney disease Leber's disease Megaloblastic anemia An unusual or allergic reaction to cyanocobalamin, cobalt, other medications, foods, dyes, or preservatives Pregnant or trying to get pregnant Breast-feeding How should I use this medication? This medication is injected into a muscle or deeply under the skin. It is usually given in a clinic or care team's office. However, your care team may teach you how to inject yourself. Follow all instructions. Talk to your care team about the use of this medication in children. Special care may be needed. Overdosage: If you think you have taken too much of this medicine contact a poison control center or emergency room at once. NOTE: This medicine is only for you. Do not share this medicine with others. What if I miss a dose? If you are given your dose at a clinic or care team's office, call to reschedule your appointment. If you give your own injections, and you miss a dose, take it as soon as you can. If it is almost time for your next dose, take only that dose. Do not take double or extra doses. What may interact with this medication? Alcohol Colchicine This list may not describe all possible  interactions. Give your health care provider a list of all the medicines, herbs, non-prescription drugs, or dietary supplements you use. Also tell them if you smoke, drink alcohol, or use illegal drugs. Some items may interact with your medicine. What should I watch for while using this medication? Visit your care team regularly. You may need blood work done while you are taking this medication. You may need to follow a special diet. Talk to your care team. Limit your alcohol intake and avoid smoking to get the best benefit. What side effects may I notice from receiving this medication? Side effects that you should report to your care team as soon as possible: Allergic reactions--skin rash, itching, hives, swelling of the face, lips, tongue, or throat Swelling of the ankles, hands, or feet Trouble breathing Side effects that usually do not require medical attention (report to your care team if they continue or are bothersome): Diarrhea This list may not describe all possible side effects. Call your doctor for medical advice about side effects. You may report side effects to FDA at 1-800-FDA-1088. Where should I keep my medication? Keep out of the reach of children. Store at room temperature between 15 and 30 degrees C (59 and 85 degrees F). Protect from light. Throw away any unused medication after the expiration date. NOTE: This sheet is a summary. It may not cover all possible information. If you have questions about this medicine, talk to your doctor, pharmacist, or health care provider.  2024 Elsevier/Gold Standard (2021-05-20 00:00:00)

## 2023-12-07 ENCOUNTER — Encounter: Payer: Medicare PPO | Admitting: Obstetrics and Gynecology

## 2023-12-21 ENCOUNTER — Other Ambulatory Visit: Payer: Self-pay | Admitting: Oncology

## 2023-12-21 DIAGNOSIS — R928 Other abnormal and inconclusive findings on diagnostic imaging of breast: Secondary | ICD-10-CM

## 2023-12-23 ENCOUNTER — Inpatient Hospital Stay: Payer: Medicare PPO | Attending: Oncology

## 2023-12-23 VITALS — BP 149/80 | HR 69 | Temp 98.1°F | Resp 18

## 2023-12-23 DIAGNOSIS — E538 Deficiency of other specified B group vitamins: Secondary | ICD-10-CM | POA: Diagnosis present

## 2023-12-23 DIAGNOSIS — D509 Iron deficiency anemia, unspecified: Secondary | ICD-10-CM

## 2023-12-23 MED ORDER — CYANOCOBALAMIN 1000 MCG/ML IJ SOLN
1000.0000 ug | Freq: Once | INTRAMUSCULAR | Status: AC
  Administered 2023-12-23: 1000 ug via INTRAMUSCULAR
  Filled 2023-12-23: qty 1

## 2023-12-24 ENCOUNTER — Encounter

## 2023-12-27 ENCOUNTER — Ambulatory Visit
Admission: RE | Admit: 2023-12-27 | Discharge: 2023-12-27 | Disposition: A | Discharge status: Home / Self Care | Source: Ambulatory Visit | Attending: Oncology | Admitting: Oncology

## 2023-12-27 DIAGNOSIS — R928 Other abnormal and inconclusive findings on diagnostic imaging of breast: Secondary | ICD-10-CM

## 2024-01-20 ENCOUNTER — Inpatient Hospital Stay: Payer: Medicare PPO | Attending: Oncology

## 2024-01-20 VITALS — BP 136/66 | HR 66 | Temp 98.0°F | Resp 18

## 2024-01-20 DIAGNOSIS — E538 Deficiency of other specified B group vitamins: Secondary | ICD-10-CM | POA: Diagnosis present

## 2024-01-20 DIAGNOSIS — D509 Iron deficiency anemia, unspecified: Secondary | ICD-10-CM

## 2024-01-20 MED ORDER — CYANOCOBALAMIN 1000 MCG/ML IJ SOLN
1000.0000 ug | Freq: Once | INTRAMUSCULAR | Status: AC
  Administered 2024-01-20: 1000 ug via INTRAMUSCULAR
  Filled 2024-01-20: qty 1

## 2024-01-20 NOTE — Patient Instructions (Signed)
 Vitamin B12 Injection What is this medication? Vitamin B12 (VAHY tuh min B12) prevents and treats low vitamin B12 levels in your body. It is used in people who do not get enough vitamin B12 from their diet or when their digestive tract does not absorb enough. Vitamin B12 plays an important role in maintaining the health of your nervous system and red blood cells. This medicine may be used for other purposes; ask your health care provider or pharmacist if you have questions. COMMON BRAND NAME(S): B-12 Compliance Kit, B-12 Injection Kit, Cyomin, Dodex, LA-12, Nutri-Twelve, Physicians EZ Use B-12, Primabalt, Vitamin Deficiency Injectable System - B12 What should I tell my care team before I take this medication? They need to know if you have any of these conditions: Kidney disease Leber's disease Megaloblastic anemia An unusual or allergic reaction to cyanocobalamin, cobalt, other medications, foods, dyes, or preservatives Pregnant or trying to get pregnant Breast-feeding How should I use this medication? This medication is injected into a muscle or deeply under the skin. It is usually given in a clinic or care team's office. However, your care team may teach you how to inject yourself. Follow all instructions. Talk to your care team about the use of this medication in children. Special care may be needed. Overdosage: If you think you have taken too much of this medicine contact a poison control center or emergency room at once. NOTE: This medicine is only for you. Do not share this medicine with others. What if I miss a dose? If you are given your dose at a clinic or care team's office, call to reschedule your appointment. If you give your own injections, and you miss a dose, take it as soon as you can. If it is almost time for your next dose, take only that dose. Do not take double or extra doses. What may interact with this medication? Alcohol Colchicine This list may not describe all possible  interactions. Give your health care provider a list of all the medicines, herbs, non-prescription drugs, or dietary supplements you use. Also tell them if you smoke, drink alcohol, or use illegal drugs. Some items may interact with your medicine. What should I watch for while using this medication? Visit your care team regularly. You may need blood work done while you are taking this medication. You may need to follow a special diet. Talk to your care team. Limit your alcohol intake and avoid smoking to get the best benefit. What side effects may I notice from receiving this medication? Side effects that you should report to your care team as soon as possible: Allergic reactions--skin rash, itching, hives, swelling of the face, lips, tongue, or throat Swelling of the ankles, hands, or feet Trouble breathing Side effects that usually do not require medical attention (report to your care team if they continue or are bothersome): Diarrhea This list may not describe all possible side effects. Call your doctor for medical advice about side effects. You may report side effects to FDA at 1-800-FDA-1088. Where should I keep my medication? Keep out of the reach of children. Store at room temperature between 15 and 30 degrees C (59 and 85 degrees F). Protect from light. Throw away any unused medication after the expiration date. NOTE: This sheet is a summary. It may not cover all possible information. If you have questions about this medicine, talk to your doctor, pharmacist, or health care provider.  2024 Elsevier/Gold Standard (2021-05-20 00:00:00)

## 2024-02-17 ENCOUNTER — Inpatient Hospital Stay: Payer: Medicare PPO

## 2024-02-17 ENCOUNTER — Inpatient Hospital Stay

## 2024-02-17 VITALS — BP 136/79 | HR 60 | Temp 97.7°F | Resp 18

## 2024-02-17 DIAGNOSIS — D509 Iron deficiency anemia, unspecified: Secondary | ICD-10-CM

## 2024-02-17 DIAGNOSIS — E538 Deficiency of other specified B group vitamins: Secondary | ICD-10-CM | POA: Diagnosis not present

## 2024-02-17 MED ORDER — CYANOCOBALAMIN 1000 MCG/ML IJ SOLN
1000.0000 ug | Freq: Once | INTRAMUSCULAR | Status: AC
  Administered 2024-02-17: 1000 ug via INTRAMUSCULAR
  Filled 2024-02-17: qty 1

## 2024-02-17 NOTE — Patient Instructions (Signed)
 Vitamin B12 Injection What is this medication? Vitamin B12 (VAHY tuh min B12) prevents and treats low vitamin B12 levels in your body. It is used in people who do not get enough vitamin B12 from their diet or when their digestive tract does not absorb enough. Vitamin B12 plays an important role in maintaining the health of your nervous system and red blood cells. This medicine may be used for other purposes; ask your health care provider or pharmacist if you have questions. COMMON BRAND NAME(S): B-12 Compliance Kit, B-12 Injection Kit, Cyomin, Dodex, LA-12, Nutri-Twelve, Physicians EZ Use B-12, Primabalt, Vitamin Deficiency Injectable System - B12 What should I tell my care team before I take this medication? They need to know if you have any of these conditions: Kidney disease Leber's disease Megaloblastic anemia An unusual or allergic reaction to cyanocobalamin, cobalt, other medications, foods, dyes, or preservatives Pregnant or trying to get pregnant Breast-feeding How should I use this medication? This medication is injected into a muscle or deeply under the skin. It is usually given in a clinic or care team's office. However, your care team may teach you how to inject yourself. Follow all instructions. Talk to your care team about the use of this medication in children. Special care may be needed. Overdosage: If you think you have taken too much of this medicine contact a poison control center or emergency room at once. NOTE: This medicine is only for you. Do not share this medicine with others. What if I miss a dose? If you are given your dose at a clinic or care team's office, call to reschedule your appointment. If you give your own injections, and you miss a dose, take it as soon as you can. If it is almost time for your next dose, take only that dose. Do not take double or extra doses. What may interact with this medication? Alcohol Colchicine This list may not describe all possible  interactions. Give your health care provider a list of all the medicines, herbs, non-prescription drugs, or dietary supplements you use. Also tell them if you smoke, drink alcohol, or use illegal drugs. Some items may interact with your medicine. What should I watch for while using this medication? Visit your care team regularly. You may need blood work done while you are taking this medication. You may need to follow a special diet. Talk to your care team. Limit your alcohol intake and avoid smoking to get the best benefit. What side effects may I notice from receiving this medication? Side effects that you should report to your care team as soon as possible: Allergic reactions--skin rash, itching, hives, swelling of the face, lips, tongue, or throat Swelling of the ankles, hands, or feet Trouble breathing Side effects that usually do not require medical attention (report to your care team if they continue or are bothersome): Diarrhea This list may not describe all possible side effects. Call your doctor for medical advice about side effects. You may report side effects to FDA at 1-800-FDA-1088. Where should I keep my medication? Keep out of the reach of children. Store at room temperature between 15 and 30 degrees C (59 and 85 degrees F). Protect from light. Throw away any unused medication after the expiration date. NOTE: This sheet is a summary. It may not cover all possible information. If you have questions about this medicine, talk to your doctor, pharmacist, or health care provider.  2024 Elsevier/Gold Standard (2021-05-20 00:00:00)

## 2024-03-16 ENCOUNTER — Inpatient Hospital Stay: Payer: Medicare PPO | Attending: Oncology

## 2024-03-16 VITALS — BP 126/63 | HR 63 | Temp 98.7°F | Resp 20

## 2024-03-16 DIAGNOSIS — E538 Deficiency of other specified B group vitamins: Secondary | ICD-10-CM | POA: Insufficient documentation

## 2024-03-16 DIAGNOSIS — D509 Iron deficiency anemia, unspecified: Secondary | ICD-10-CM

## 2024-03-16 MED ORDER — CYANOCOBALAMIN 1000 MCG/ML IJ SOLN
1000.0000 ug | Freq: Once | INTRAMUSCULAR | Status: AC
  Administered 2024-03-16: 1000 ug via INTRAMUSCULAR
  Filled 2024-03-16: qty 1

## 2024-03-16 NOTE — Patient Instructions (Signed)
 Vitamin B12 Injection What is this medication? Vitamin B12 (VAHY tuh min B12) prevents and treats low vitamin B12 levels in your body. It is used in people who do not get enough vitamin B12 from their diet or when their digestive tract does not absorb enough. Vitamin B12 plays an important role in maintaining the health of your nervous system and red blood cells. This medicine may be used for other purposes; ask your health care provider or pharmacist if you have questions. COMMON BRAND NAME(S): B-12 Compliance Kit, B-12 Injection Kit, Cyomin, Dodex, LA-12, Nutri-Twelve, Physicians EZ Use B-12, Primabalt, Vitamin Deficiency Injectable System - B12 What should I tell my care team before I take this medication? They need to know if you have any of these conditions: Kidney disease Leber's disease Megaloblastic anemia An unusual or allergic reaction to cyanocobalamin, cobalt, other medications, foods, dyes, or preservatives Pregnant or trying to get pregnant Breast-feeding How should I use this medication? This medication is injected into a muscle or deeply under the skin. It is usually given in a clinic or care team's office. However, your care team may teach you how to inject yourself. Follow all instructions. Talk to your care team about the use of this medication in children. Special care may be needed. Overdosage: If you think you have taken too much of this medicine contact a poison control center or emergency room at once. NOTE: This medicine is only for you. Do not share this medicine with others. What if I miss a dose? If you are given your dose at a clinic or care team's office, call to reschedule your appointment. If you give your own injections, and you miss a dose, take it as soon as you can. If it is almost time for your next dose, take only that dose. Do not take double or extra doses. What may interact with this medication? Alcohol Colchicine This list may not describe all possible  interactions. Give your health care provider a list of all the medicines, herbs, non-prescription drugs, or dietary supplements you use. Also tell them if you smoke, drink alcohol, or use illegal drugs. Some items may interact with your medicine. What should I watch for while using this medication? Visit your care team regularly. You may need blood work done while you are taking this medication. You may need to follow a special diet. Talk to your care team. Limit your alcohol intake and avoid smoking to get the best benefit. What side effects may I notice from receiving this medication? Side effects that you should report to your care team as soon as possible: Allergic reactions--skin rash, itching, hives, swelling of the face, lips, tongue, or throat Swelling of the ankles, hands, or feet Trouble breathing Side effects that usually do not require medical attention (report to your care team if they continue or are bothersome): Diarrhea This list may not describe all possible side effects. Call your doctor for medical advice about side effects. You may report side effects to FDA at 1-800-FDA-1088. Where should I keep my medication? Keep out of the reach of children. Store at room temperature between 15 and 30 degrees C (59 and 85 degrees F). Protect from light. Throw away any unused medication after the expiration date. NOTE: This sheet is a summary. It may not cover all possible information. If you have questions about this medicine, talk to your doctor, pharmacist, or health care provider.  2024 Elsevier/Gold Standard (2021-05-20 00:00:00)

## 2024-03-23 NOTE — Progress Notes (Signed)
 [facility] AT [facility]  Patient Care Team: Ofilia Lamar CROME, MD as PCP - General (Family Medicine)  Clinic Day: 03/29/24  Referring physician: Ofilia Lamar CROME, MD  ASSESSMENT & PLAN:  Assessment: Iron  deficiency anemia She has only had a partial response to oral supplement despite being on it for 6 months.  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 She received IV iron  in June of 2023 and again in June, 2024.   Strong family history for malignancy This includes ovarian and colon cancer.  I do recommend a genetics clinic referral for further evaluation and recommendations.   B12 Deficiency B12 level was 156 on 03/05/22. She stopped monthly B12 injections in January and repeat B-12 level was down to 224 and dropping. We therefore have her back on monthly injections as this is most consistent with pernicious anemia.   Osteoporosis  Bone density scan done on 10/26/2023 which revealed significant worsening osteoporosis of the spine with a T-score of -2.8 from -1.6 and right femoral hip with a T-score of -2.1 from -1.8. so her bones have gone from osteopenia in June, 2022 to osteoporosis in February, 2025. I think this is largely due to her inactivity. I do recommend treatment for it in the form of alendronate  70 mg weekly. I have discussed the alternatives and potential toxicities. I would repeat a bone density scan in 2 years.   Hypercalcemia This is chronic and persists although her calcium supplement has been stopped.   Plan:  She had a screening bilateral mammogram done on 10/26/2023 which revealed calcifications in the right breast and was recommended for a diagnostic mammogram of the right breast for further evaluation. Repeat diagnostic mammogram of the right breast done on 12/27/2023 confirmed this to be probable benign calcifications in the upper-inner quadrant of the right breast. She was  then recommended repeat imaging in 6 months. Patient also had a bone density scan done on 10/26/2023 which revealed worsening osteoporosis of the spine with a T-score of -2.8 from -1.6 and right femoral hip with a T-score of -2.1 from -1.8. She last had IV iron  1 year ago and receives monthly B-12 injections. 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 I recommended a bone strengthening medication such as Fosamax , Prolia, or Reclast and informed her of the pros and cons of each. She agreed to start one so I will prescribe alendronate  70 mg once weekly. She has a WBC of 8.0, hemoglobin of 17.1, and platelet count of 278,000. Her CMP is normal other than a elevated calcium of 10.9, alkaline phosphatase of 139, and total bilirubin of 1.4. Her repeat mammogram will be scheduled for October, 2025 and I will see her back in 6 months with CBC and CMP. I discussed the assessment and treatment plan with the patient. The patient was provided an opportunity to ask questions and all were answered.  The patient agreed with the plan and demonstrated an understanding of the instructions.  The patient was advised to call back if the symptoms worsen or if the condition fails to improve as anticipated.  I provided 26 minutes  of face-to-face time during this this encounter and > 50% was spent counseling as documented under my assessment and plan.   Wanda VEAR Cornish, MD  [facility]  [facility] [place] - [facility] DE[facility] OF [facility] [address] [place] [address] Dept: [phone] Dept Fax: [phone]   No orders of the defined types were placed in this encounter.    CHIEF COMPLAINT:  CC: History of iron  deficiency anemia and B12 deficiency  Current Treatment:  Surveillance  INTERVAL HISTORY:  Kinzley is here today for repeat clinical assessment for her history of iron  deficiency anemia and  B12 deficiency. Patient states that she feels ok and has no complaints of pain. She had a screening bilateral mammogram done on 10/26/2023 which revealed calcifications in the right breast and was recommended a diagnostic mammogram of the right breast for further evaluation. Repeat diagnostic mammogram of the right breast done on 12/27/2023 confirmed this to be probable benign calcifications in the upper-inner quadrant of the right breast. She was then recommended repeat imaging in 6 months. Patient also had a bone density scan done on 10/26/2023 which revealed worsening osteoporosis of the spine with a T-score of -2.8 from -1.6 and right femoral hip with a T-score of -2.1 from -1.8. She last had IV iron  1 year ago and receives monthly B-12 injections. 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 I recommended a bone strengthening medication such as Fosamax , Prolia, or Reclast and informed her of the pros and cons of each. She agreed to start one so I will prescribe alendronate  70 mg once weekly. She has a WBC of 8.0, hemoglobin of 17.1, and platelet count of 278,000. Her CMP is normal other than a elevated calcium of 10.9, alkaline phosphatase of 139, and total bilirubin of 1.4. Her repeat mammogram will be scheduled for October, 2025 and I will see her back in 6 months with CBC and CMP.   She denies fever, chills, night sweats, or other signs of infection. She denies cardiorespiratory and gastrointestinal issues. She  denies pain. Her appetite is fine and Her weight has increased 2 pounds over last 11 months. This patient is accompanied in the office by her husband.    I have reviewed the past medical history, past surgical history, social history and family history with the patient and they are unchanged from previous note.  ALLERGIES:  is allergic to atorvastatin, other, and rosuvastatin.  MEDICATIONS:  Current  Outpatient Medications  Medication Sig Dispense Refill   acetaminophen  (TYLENOL ) 325 MG tablet Take 650 mg by mouth every 4 (four) hours as needed for moderate pain.     calcium carbonate (OSCAL) 1500 (600 Ca) MG TABS tablet Take 600 mg by mouth 2 (two) times daily.      carbidopa-levodopa (SINEMET IR) 25-100 MG tablet SMARTSIG:0-2 Tablet(s) By Mouth 3 Times Daily     cholecalciferol (VITAMIN D3) 25 MCG (1000 UNIT) tablet Take 1,000 Units by mouth in the morning and at bedtime.     clopidogrel (PLAVIX) 75 MG tablet Take 75 mg by mouth daily.     cyanocobalamin  (VITAMIN B12) 1000 MCG/ML injection Inject 1,000 mcg into the muscle every 30 (thirty) days.     CYMBALTA 30 MG capsule Take 30 mg by mouth daily.     dextromethorphan-guaiFENesin (MUCINEX DM) 30-600 MG 12hr tablet Take 1 tablet by mouth 2 (two) times daily.     donepezil (ARICEPT) 5 MG tablet Take by mouth.     empagliflozin  (JARDIANCE ) 25 MG TABS tablet Take 25 mg by mouth daily.  ezetimibe  (ZETIA ) 10 MG tablet Take 10 mg by mouth at bedtime.     gabapentin  (NEURONTIN ) 300 MG capsule Take 1 capsule (300 mg total) by mouth daily.     ipratropium-albuterol  (DUONEB) 0.5-2.5 (3) MG/3ML SOLN Take 3 mLs by nebulization every 6 (six) hours as needed (for COVID for 10 days).     irbesartan  (AVAPRO ) 150 MG tablet Take 150 mg by mouth daily.     levothyroxine  (SYNTHROID ) 100 MCG tablet Take 100 mcg by mouth daily.     Magnesium Hydroxide (MILK OF MAGNESIA PO) Take 30 mLs by mouth daily.     metoprolol  tartrate (LOPRESSOR ) 25 MG tablet Take 0.5 tablets (12.5 mg total) by mouth 2 (two) times daily.     REPATHA SURECLICK 140 MG/ML SOAJ Inject into the skin.     TRULICITY  3 MG/0.5ML SOPN Inject 3 mg into the muscle every Wednesday.     alendronate  (FOSAMAX ) 70 MG tablet Take 1 tablet (70 mg total) by mouth once a week. Take with a full glass of water on an empty stomach. 12 tablet 1   No current facility-administered medications for this visit.     HISTORY OF PRESENT ILLNESS:   Oncology History   No history exists.    REVIEW OF SYSTEMS:  Review of Systems  Constitutional:  Positive for fatigue. Negative for appetite change, chills, diaphoresis, fever and unexpected weight change.  HENT:  Negative.  Negative for hearing loss, lump/mass, mouth sores, nosebleeds, sore throat, tinnitus, trouble swallowing and voice change.   Eyes: Negative.  Negative for eye problems and icterus.  Respiratory: Negative.  Negative for chest tightness, cough, hemoptysis, shortness of breath and wheezing.   Cardiovascular: Negative.  Negative for chest pain, leg swelling and palpitations.  Gastrointestinal: Negative.  Negative for abdominal distention, abdominal pain, blood in stool, constipation, diarrhea, nausea, rectal pain and vomiting.  Endocrine: Negative.   Genitourinary: Negative.  Negative for bladder incontinence, difficulty urinating, dyspareunia, dysuria, frequency, hematuria, menstrual problem, nocturia, pelvic pain, vaginal bleeding and vaginal discharge.   Musculoskeletal:  Positive for gait problem (wheelchair). Negative for arthralgias, back pain, flank pain, myalgias, neck pain and neck stiffness.       Left hip pain  Skin: Negative.  Negative for itching, rash and wound.  Neurological:  Positive for extremity weakness (limited walking ability) and gait problem (wheelchair). Negative for dizziness, headaches, light-headedness, numbness, seizures and speech difficulty.       Tremor, Parkinson's disease  Hematological: Negative.  Negative for adenopathy. Does not bruise/bleed easily.  Psychiatric/Behavioral: Negative.  Negative for confusion, decreased concentration, depression, sleep disturbance and suicidal ideas. The patient is not nervous/anxious.    VITALS:  Blood pressure 139/65, pulse 76, temperature 98.1 F (36.7 C), temperature source Oral, resp. rate 18, height 5' 5 (1.651 m), weight 179 lb 3.2 oz (81.3 kg), last menstrual  period 02/20/2000, SpO2 95%.  Vitals:   03/29/24 1118  BP: 139/65  Pulse: 76  Resp: 18  Temp: 98.1 F (36.7 C)  SpO2: 95%   Wt Readings from Last 3 Encounters:  03/29/24 179 lb 3.2 oz (81.3 kg)  04/30/23 177 lb 4 oz (80.4 kg)  03/04/23 183 lb 0.6 oz (83 kg)    Body mass index is 29.82 kg/m.  Performance status (ECOG): 2 - Symptomatic, <50% confined to bed  PHYSICAL EXAM:  Physical Exam Vitals and nursing note reviewed. Exam conducted with a chaperone present.  Constitutional:      General: She is not in acute distress.  Appearance: Normal appearance. She is normal weight. She is not ill-appearing, toxic-appearing or diaphoretic.  HENT:     Head: Normocephalic and atraumatic.     Right Ear: Tympanic membrane, ear canal and external ear normal. There is no impacted cerumen.     Left Ear: Tympanic membrane, ear canal and external ear normal. There is no impacted cerumen.     Nose: Nose normal. No congestion or rhinorrhea.     Mouth/Throat:     Mouth: Mucous membranes are moist.     Pharynx: Oropharynx is clear. No oropharyngeal exudate or posterior oropharyngeal erythema.  Eyes:     General: No scleral icterus.       Right eye: No discharge.        Left eye: No discharge.     Extraocular Movements: Extraocular movements intact.     Conjunctiva/sclera: Conjunctivae normal.     Pupils: Pupils are equal, round, and reactive to light.  Neck:     Vascular: No carotid bruit.  Cardiovascular:     Rate and Rhythm: Normal rate and regular rhythm.     Pulses: Normal pulses.     Heart sounds: Normal heart sounds. No murmur heard.    No friction rub. No gallop.  Pulmonary:     Effort: Pulmonary effort is normal. No respiratory distress.     Breath sounds: Normal breath sounds. No stridor. No wheezing, rhonchi or rales.  Chest:     Chest wall: No tenderness.  Abdominal:     General: Bowel sounds are normal. There is no distension.     Palpations: Abdomen is soft. There is no  hepatomegaly, splenomegaly or mass.     Tenderness: There is no abdominal tenderness. There is no right CVA tenderness, left CVA tenderness, guarding or rebound.     Hernia: No hernia is present.  Musculoskeletal:        General: No swelling, tenderness, deformity or signs of injury. Normal range of motion.     Cervical back: Normal range of motion and neck supple. No rigidity or tenderness.     Right lower leg: No edema.     Left lower leg: No edema.     Left foot: No swelling.  Lymphadenopathy:     Cervical: No cervical adenopathy.  Skin:    General: Skin is warm and dry.     Coloration: Skin is not jaundiced or pale.     Findings: No bruising, erythema, lesion or rash.  Neurological:     General: No focal deficit present.     Mental Status: She is alert and oriented to person, place, and time. Mental status is at baseline.     Cranial Nerves: No cranial nerve deficit.     Sensory: No sensory deficit.     Motor: Tremor present. No weakness.     Coordination: Coordination normal.     Gait: Gait normal.     Deep Tendon Reflexes: Reflexes normal.     Comments: Tremor of both hands  Psychiatric:        Mood and Affect: Mood normal.        Behavior: Behavior normal.        Thought Content: Thought content normal.        Judgment: Judgment normal.    LABORATORY DATA:  I have reviewed the data as listed    Component Value Date/Time   NA 141 03/29/2024 1049   K 3.8 03/29/2024 1049   CL 105 03/29/2024 1049   CO2  24 03/29/2024 1049   GLUCOSE 149 (H) 03/29/2024 1049   BUN 13 03/29/2024 1049   CREATININE 0.70 03/29/2024 1049   CALCIUM 10.9 (H) 03/29/2024 1049   PROT 7.4 03/29/2024 1049   ALBUMIN 4.2 03/29/2024 1049   AST 15 03/29/2024 1049   ALT 15 03/29/2024 1049   ALKPHOS 139 (H) 03/29/2024 1049   BILITOT 1.4 (H) 03/29/2024 1049   GFRNONAA >60 03/29/2024 1049   No results found for: SPEP, UPEP     Latest Ref Rng & Units 03/29/2024   10:49 AM 09/30/2023    1:04 PM  05/02/2023    2:35 AM  CBC  WBC 4.0 - 10.5 K/uL 8.0  6.6  8.5   Hemoglobin 12.0 - 15.0 g/dL 82.8  84.1  85.8   Hematocrit 36.0 - 46.0 % 53.4  47.5  43.4   Platelets 150 - 400 K/uL 278  280  234    Lab Results  Component Value Date   TSH 4.993 (H) 05/01/2023   Lab Results  Component Value Date   VITAMINB12 341 09/30/2023   Lab Results  Component Value Date   IRON  113 09/30/2023   TIBC 372 09/30/2023   FERRITIN 354 (H) 09/30/2023    RADIOGRAPHIC STUDIES: I have personally reviewed the radiological images as listed and agreed with the findings in the report. EXAM: 12/27/2023 DIGITAL DIAGNOSTIC UNILATERAL RIGHT MAMMOGRAM IMPRESSION: Probable benign calcifications in the upper-inner quadrant of the right breast. ECOMMENDATION: Short-term interval follow-up right mammogram in 6 months is recommended.  EXAM: 10/26/2023 DUAL X-RAY ABSORPTIOMETRY (DXA) FOR BONE MINERAL DENSITY LUMBAR SPINE (L1-L2): BMD (in g/cm2): 0.828 T-score: -2.8 Z-score: -1.1   LEFT FEMORAL NECK: BMD (in g/cm2): 0.714 T-score: -2.3 Z-score: -0.5   LEFT TOTAL HIP: BMD (in g/cm2): 0.686 T-score: -2.6 Z-score: -1.0   RIGHT FEMORAL NECK: BMD (in g/cm2): 0.742 T-score: -2.1 Z-score: -0.3   RIGHT TOTAL HIP: BMD (in g/cm2): 0.735 T-score: -2.2 Z-score: -0.6    IMPRESSION: Osteoporosis based on BMD.   EXAM: 10/26/2023 DIGITAL SCREENING BILATERAL MAMMOGRAM WITH TOMOSYNTHESIS AND CAD IMPRESSION: Further evaluation is suggested for calcifications in the right breast. RECOMMENDATION: Diagnostic mammogram of the right breast. (Code:FI-R-74M)   I,Jasmine M Lassiter,acting as a scribe for Wanda VEAR Cornish, MD.,have documented all relevant documentation on the behalf of Wanda VEAR Cornish, MD,as directed by  Wanda VEAR Cornish, MD while in the presence of Wanda VEAR Cornish, MD.  I have reviewed this report as typed by the medical scribe, and it is complete and accurate.  Jasmine M  Lassiter   03/29/24 12:02 PM

## 2024-03-29 ENCOUNTER — Other Ambulatory Visit: Payer: Self-pay | Admitting: Oncology

## 2024-03-29 ENCOUNTER — Inpatient Hospital Stay: Payer: Medicare PPO | Attending: Oncology | Admitting: Oncology

## 2024-03-29 ENCOUNTER — Inpatient Hospital Stay: Payer: Medicare PPO

## 2024-03-29 ENCOUNTER — Encounter: Payer: Self-pay | Admitting: Oncology

## 2024-03-29 VITALS — BP 139/65 | HR 76 | Temp 98.1°F | Resp 18 | Ht 65.0 in | Wt 179.2 lb

## 2024-03-29 DIAGNOSIS — Z800 Family history of malignant neoplasm of digestive organs: Secondary | ICD-10-CM | POA: Insufficient documentation

## 2024-03-29 DIAGNOSIS — R921 Mammographic calcification found on diagnostic imaging of breast: Secondary | ICD-10-CM | POA: Insufficient documentation

## 2024-03-29 DIAGNOSIS — D500 Iron deficiency anemia secondary to blood loss (chronic): Secondary | ICD-10-CM | POA: Diagnosis not present

## 2024-03-29 DIAGNOSIS — E538 Deficiency of other specified B group vitamins: Secondary | ICD-10-CM | POA: Insufficient documentation

## 2024-03-29 DIAGNOSIS — M85851 Other specified disorders of bone density and structure, right thigh: Secondary | ICD-10-CM | POA: Diagnosis not present

## 2024-03-29 DIAGNOSIS — D509 Iron deficiency anemia, unspecified: Secondary | ICD-10-CM | POA: Diagnosis not present

## 2024-03-29 DIAGNOSIS — Z8041 Family history of malignant neoplasm of ovary: Secondary | ICD-10-CM | POA: Diagnosis not present

## 2024-03-29 DIAGNOSIS — D510 Vitamin B12 deficiency anemia due to intrinsic factor deficiency: Secondary | ICD-10-CM

## 2024-03-29 DIAGNOSIS — D519 Vitamin B12 deficiency anemia, unspecified: Secondary | ICD-10-CM

## 2024-03-29 DIAGNOSIS — M810 Age-related osteoporosis without current pathological fracture: Secondary | ICD-10-CM | POA: Insufficient documentation

## 2024-03-29 LAB — CMP (CANCER CENTER ONLY)
ALT: 15 U/L (ref 0–44)
AST: 15 U/L (ref 15–41)
Albumin: 4.2 g/dL (ref 3.5–5.0)
Alkaline Phosphatase: 139 U/L — ABNORMAL HIGH (ref 38–126)
Anion gap: 12 (ref 5–15)
BUN: 13 mg/dL (ref 8–23)
CO2: 24 mmol/L (ref 22–32)
Calcium: 10.9 mg/dL — ABNORMAL HIGH (ref 8.9–10.3)
Chloride: 105 mmol/L (ref 98–111)
Creatinine: 0.7 mg/dL (ref 0.44–1.00)
GFR, Estimated: >60 mL/min (ref >60)
Glucose, Bld: 149 mg/dL — ABNORMAL HIGH (ref 70–99)
Potassium: 3.8 mmol/L (ref 3.5–5.1)
Sodium: 141 mmol/L (ref 135–145)
Total Bilirubin: 1.4 mg/dL — ABNORMAL HIGH (ref 0.0–1.2)
Total Protein: 7.4 g/dL (ref 6.5–8.1)

## 2024-03-29 LAB — CBC WITH DIFFERENTIAL (CANCER CENTER ONLY)
Abs Immature Granulocytes: 0.02 K/uL (ref 0.00–0.07)
Basophils Absolute: 0.1 K/uL (ref 0.0–0.1)
Basophils Relative: 1 %
Eosinophils Absolute: 0.1 K/uL (ref 0.0–0.5)
Eosinophils Relative: 1 %
HCT: 53.4 % — ABNORMAL HIGH (ref 36.0–46.0)
Hemoglobin: 17.1 g/dL — ABNORMAL HIGH (ref 12.0–15.0)
Immature Granulocytes: 0 %
Lymphocytes Relative: 23 %
Lymphs Abs: 1.9 K/uL (ref 0.7–4.0)
MCH: 30.1 pg (ref 26.0–34.0)
MCHC: 32 g/dL (ref 30.0–36.0)
MCV: 94 fL (ref 80.0–100.0)
Monocytes Absolute: 0.4 K/uL (ref 0.1–1.0)
Monocytes Relative: 5 %
Neutro Abs: 5.6 K/uL (ref 1.7–7.7)
Neutrophils Relative %: 70 %
Platelet Count: 278 K/uL (ref 150–400)
RBC: 5.68 MIL/uL — ABNORMAL HIGH (ref 3.87–5.11)
RDW: 13.2 % (ref 11.5–15.5)
WBC Count: 8 K/uL (ref 4.0–10.5)
nRBC: 0 % (ref 0.0–0.2)

## 2024-03-29 MED ORDER — ALENDRONATE SODIUM 70 MG PO TABS: 70.0000 mg | ORAL_TABLET | ORAL | 1 refills | Status: DC

## 2024-04-13 ENCOUNTER — Inpatient Hospital Stay: Payer: Medicare PPO

## 2024-04-13 ENCOUNTER — Encounter: Payer: Self-pay | Admitting: Oncology

## 2024-04-13 VITALS — BP 122/72 | HR 67 | Temp 97.8°F | Resp 20

## 2024-04-13 DIAGNOSIS — E538 Deficiency of other specified B group vitamins: Secondary | ICD-10-CM | POA: Diagnosis not present

## 2024-04-13 DIAGNOSIS — D509 Iron deficiency anemia, unspecified: Secondary | ICD-10-CM

## 2024-04-13 MED ORDER — CYANOCOBALAMIN 1000 MCG/ML IJ SOLN
1000.0000 ug | Freq: Once | INTRAMUSCULAR | Status: AC
  Administered 2024-04-13: 1000 ug via INTRAMUSCULAR
  Filled 2024-04-13: qty 1

## 2024-04-13 NOTE — Patient Instructions (Signed)
 Vitamin B12 Injection What is this medication? Vitamin B12 (VAHY tuh min B12) prevents and treats low vitamin B12 levels in your body. It is used in people who do not get enough vitamin B12 from their diet or when their digestive tract does not absorb enough. Vitamin B12 plays an important role in maintaining the health of your nervous system and red blood cells. This medicine may be used for other purposes; ask your health care provider or pharmacist if you have questions. COMMON BRAND NAME(S): B-12 Compliance Kit, B-12 Injection Kit, Cyomin, Dodex, LA-12, Nutri-Twelve, Physicians EZ Use B-12, Primabalt, Vitamin Deficiency Injectable System - B12 What should I tell my care team before I take this medication? They need to know if you have any of these conditions: Kidney disease Leber's disease Megaloblastic anemia An unusual or allergic reaction to cyanocobalamin, cobalt, other medications, foods, dyes, or preservatives Pregnant or trying to get pregnant Breast-feeding How should I use this medication? This medication is injected into a muscle or deeply under the skin. It is usually given in a clinic or care team's office. However, your care team may teach you how to inject yourself. Follow all instructions. Talk to your care team about the use of this medication in children. Special care may be needed. Overdosage: If you think you have taken too much of this medicine contact a poison control center or emergency room at once. NOTE: This medicine is only for you. Do not share this medicine with others. What if I miss a dose? If you are given your dose at a clinic or care team's office, call to reschedule your appointment. If you give your own injections, and you miss a dose, take it as soon as you can. If it is almost time for your next dose, take only that dose. Do not take double or extra doses. What may interact with this medication? Alcohol Colchicine This list may not describe all possible  interactions. Give your health care provider a list of all the medicines, herbs, non-prescription drugs, or dietary supplements you use. Also tell them if you smoke, drink alcohol, or use illegal drugs. Some items may interact with your medicine. What should I watch for while using this medication? Visit your care team regularly. You may need blood work done while you are taking this medication. You may need to follow a special diet. Talk to your care team. Limit your alcohol intake and avoid smoking to get the best benefit. What side effects may I notice from receiving this medication? Side effects that you should report to your care team as soon as possible: Allergic reactions--skin rash, itching, hives, swelling of the face, lips, tongue, or throat Swelling of the ankles, hands, or feet Trouble breathing Side effects that usually do not require medical attention (report to your care team if they continue or are bothersome): Diarrhea This list may not describe all possible side effects. Call your doctor for medical advice about side effects. You may report side effects to FDA at 1-800-FDA-1088. Where should I keep my medication? Keep out of the reach of children. Store at room temperature between 15 and 30 degrees C (59 and 85 degrees F). Protect from light. Throw away any unused medication after the expiration date. NOTE: This sheet is a summary. It may not cover all possible information. If you have questions about this medicine, talk to your doctor, pharmacist, or health care provider.  2024 Elsevier/Gold Standard (2021-05-20 00:00:00)

## 2024-05-01 ENCOUNTER — Ambulatory Visit

## 2024-05-15 ENCOUNTER — Inpatient Hospital Stay: Attending: Oncology

## 2024-05-15 VITALS — BP 136/76 | HR 65 | Temp 98.1°F | Resp 18

## 2024-05-15 DIAGNOSIS — E538 Deficiency of other specified B group vitamins: Secondary | ICD-10-CM | POA: Insufficient documentation

## 2024-05-15 DIAGNOSIS — D509 Iron deficiency anemia, unspecified: Secondary | ICD-10-CM

## 2024-05-15 MED ORDER — CYANOCOBALAMIN 1000 MCG/ML IJ SOLN
1000.0000 ug | Freq: Once | INTRAMUSCULAR | Status: AC
  Administered 2024-05-15: 1000 ug via INTRAMUSCULAR
  Filled 2024-05-15: qty 1

## 2024-05-15 NOTE — Patient Instructions (Signed)
 Vitamin B12 Deficiency Vitamin B12 deficiency means that your body does not have enough vitamin B12. The body needs this important vitamin: To make red blood cells. To make genes (DNA). To help the nerves work. If you do not have enough vitamin B12 in your body, you can have health problems, such as not having enough red blood cells in the blood (anemia). What are the causes? Not eating enough foods that contain vitamin B12. Not being able to take in (absorb) vitamin B12 from the food that you eat. Certain diseases. A condition in which the body does not make enough of a certain protein. This results in your body not taking in enough vitamin B12. Having a surgery in which part of the stomach or small intestine is taken out. Taking medicines that make it hard for the body to take in vitamin B12. These include: Heartburn medicines. Some medicines that are used to treat diabetes. What increases the risk? Being an older adult. Eating a vegetarian or vegan diet that does not include any foods that come from animals. Not eating enough foods that contain vitamin B12 while you are pregnant. Taking certain medicines. Having alcoholism. What are the signs or symptoms? In some cases, there are no symptoms. If the condition leads to too few blood cells or nerve damage, symptoms can occur, such as: Feeling weak or tired. Not being hungry. Losing feeling (numbness) or tingling in your hands and feet. Redness and burning of the tongue. Feeling sad (depressed). Confusion or memory problems. Trouble walking. If anemia is very bad, symptoms can include: Being short of breath. Being dizzy. Having a very fast heartbeat. How is this treated? Changing the way you eat and drink, such as: Eating more foods that contain vitamin B12. Drinking little or no alcohol. Getting vitamin B12 shots. Taking vitamin B12 supplements by mouth (orally). Your doctor will tell you the dose that is best for you. Follow  these instructions at home: Eating and drinking  Eat foods that come from animals and have a lot of vitamin B12 in them. These include: Meats and poultry. This includes beef, pork, chicken, [place], and organ meats, such as liver. Seafood, such as clams, rainbow trout, salmon, tuna, and haddock. Eggs. Dairy foods such as milk, yogurt, and cheese. Eat breakfast cereals that have vitamin B12 added to them (are fortified). Check the label. The items listed above may not be a complete list of foods and beverages you can eat and drink. Contact a dietitian for more information. Alcohol use Do not drink alcohol if: Your doctor tells you not to drink. You are pregnant, may be pregnant, or are planning to become pregnant. If you drink alcohol: Limit how much you have to: 0-1 drink a day for women. 0-2 drinks a day for men. Know how much alcohol is in your drink. In the [place] one drink equals one 12 oz bottle of beer (355 mL), one 5 oz glass of wine (148 mL), or one 1 oz glass of hard liquor (44 mL). General instructions Get any vitamin B12 shots if told by your doctor. Take supplements only as told by your doctor. Follow the directions. Keep all follow-up visits. Contact a doctor if: Your symptoms come back. Your symptoms get worse or do not get better with treatment. Get help right away if: You have trouble breathing. You have a very fast heartbeat. You have chest pain. You get dizzy. You faint. These symptoms may be an emergency. Get help right away. Call 911.  Do not wait to see if the symptoms will go away. Do not drive yourself to the hospital. Summary Vitamin B12 deficiency means that your body is not getting enough of the vitamin. In some cases, there are no symptoms of this condition. Treatment may include making a change in the way you eat and drink, getting shots, or taking supplements. Eat foods that have vitamin B12 in them. This information is not intended to replace advice  given to you by your health care provider. Make sure you discuss any questions you have with your health care provider. Document Revised: 05/02/2021 Document Reviewed: 05/02/2021 Elsevier Patient Education  2024 [employer].

## 2024-06-01 ENCOUNTER — Ambulatory Visit

## 2024-06-15 ENCOUNTER — Inpatient Hospital Stay: Attending: Oncology

## 2024-06-15 VITALS — BP 133/78 | HR 64 | Temp 98.4°F | Resp 18

## 2024-06-15 DIAGNOSIS — E538 Deficiency of other specified B group vitamins: Secondary | ICD-10-CM | POA: Diagnosis present

## 2024-06-15 DIAGNOSIS — Z23 Encounter for immunization: Secondary | ICD-10-CM | POA: Diagnosis not present

## 2024-06-15 DIAGNOSIS — D509 Iron deficiency anemia, unspecified: Secondary | ICD-10-CM

## 2024-06-15 MED ORDER — INFLUENZA VAC SPLIT HIGH-DOSE 0.5 ML IM SUSY
0.5000 mL | PREFILLED_SYRINGE | INTRAMUSCULAR | Status: AC
  Administered 2024-06-15: 0.5 mL via INTRAMUSCULAR
  Filled 2024-06-15: qty 0.5

## 2024-06-15 MED ORDER — CYANOCOBALAMIN 1000 MCG/ML IJ SOLN
1000.0000 ug | Freq: Once | INTRAMUSCULAR | Status: AC
  Administered 2024-06-15: 1000 ug via INTRAMUSCULAR
  Filled 2024-06-15: qty 1

## 2024-06-15 NOTE — Patient Instructions (Signed)
 Vitamin B12 Injection What is this medication? Vitamin B12 (VAHY tuh min B12) prevents and treats low vitamin B12 levels in your body. It is used in people who do not get enough vitamin B12 from their diet or when their digestive tract does not absorb enough. Vitamin B12 plays an important role in maintaining the health of your nervous system and red blood cells. This medicine may be used for other purposes; ask your health care provider or pharmacist if you have questions. COMMON BRAND NAME(S): B-12 Compliance Kit, B-12 Injection Kit, Cyomin, Dodex , LA-12, Nutri-Twelve, Physicians EZ Use B-12, Primabalt, Vitamin Deficiency Injectable System - B12 What should I tell my care team before I take this medication? They need to know if you have any of these conditions: Kidney disease Leber's disease Megaloblastic anemia An unusual or allergic reaction to cyanocobalamin , cobalt, other medications, foods, dyes, or preservatives Pregnant or trying to get pregnant Breast-feeding How should I use this medication? This medication is injected into a muscle or deeply under the skin. It is usually given in a clinic or care team's office. However, your care team may teach you how to inject yourself. Follow all instructions. Talk to your care team about the use of this medication in children. Special care may be needed. Overdosage: If you think you have taken too much of this medicine contact a poison control center or emergency room at once. NOTE: This medicine is only for you. Do not share this medicine with others. What if I miss a dose? If you are given your dose at a clinic or care team's office, call to reschedule your appointment. If you give your own injections, and you miss a dose, take it as soon as you can. If it is almost time for your next dose, take only that dose. Do not take double or extra doses. What may interact with this medication? Alcohol Colchicine This list may not describe all possible  interactions. Give your health care provider a list of all the medicines, herbs, non-prescription drugs, or dietary supplements you use. Also tell them if you smoke, drink alcohol, or use illegal drugs. Some items may interact with your medicine. What should I watch for while using this medication? Visit your care team regularly. You may need blood work done while you are taking this medication. You may need to follow a special diet. Talk to your care team. Limit your alcohol intake and avoid smoking to get the best benefit. What side effects may I notice from receiving this medication? Side effects that you should report to your care team as soon as possible: Allergic reactions--skin rash, itching, hives, swelling of the face, lips, tongue, or throat Swelling of the ankles, hands, or feet Trouble breathing Side effects that usually do not require medical attention (report to your care team if they continue or are bothersome): Diarrhea This list may not describe all possible side effects. Call your doctor for medical advice about side effects. You may report side effects to FDA at 1-800-FDA-1088. Where should I keep my medication? Keep out of the reach of children. Store at room temperature between 15 and 30 degrees C (59 and 85 degrees F). Protect from light. Throw away any unused medication after the expiration date. NOTE: This sheet is a summary. It may not cover all possible information. If you have questions about this medicine, talk to your doctor, pharmacist, or health care provider.  2024 Elsevier/Gold Standard (2021-05-20 00:00:00)

## 2024-06-30 ENCOUNTER — Other Ambulatory Visit: Payer: Self-pay | Admitting: Oncology

## 2024-06-30 ENCOUNTER — Ambulatory Visit
Admission: RE | Admit: 2024-06-30 | Discharge: 2024-06-30 | Disposition: A | Discharge status: Home / Self Care | Source: Ambulatory Visit | Attending: Oncology | Admitting: Oncology

## 2024-06-30 DIAGNOSIS — R921 Mammographic calcification found on diagnostic imaging of breast: Secondary | ICD-10-CM

## 2024-07-03 ENCOUNTER — Ambulatory Visit

## 2024-07-05 ENCOUNTER — Ambulatory Visit: Admitting: Obstetrics and Gynecology

## 2024-07-05 ENCOUNTER — Encounter: Payer: Self-pay | Admitting: Obstetrics and Gynecology

## 2024-07-05 VITALS — BP 124/82 | HR 63

## 2024-07-05 DIAGNOSIS — N760 Acute vaginitis: Secondary | ICD-10-CM | POA: Diagnosis not present

## 2024-07-05 DIAGNOSIS — R32 Unspecified urinary incontinence: Secondary | ICD-10-CM | POA: Diagnosis not present

## 2024-07-05 LAB — WET PREP FOR TRICH, YEAST, CLUE

## 2024-07-05 MED ORDER — CLOTRIMAZOLE-BETAMETHASONE 1-0.05 % EX CREA: 1.0000 | TOPICAL_CREAM | Freq: Two times a day (BID) | CUTANEOUS | 0 refills | Status: AC

## 2024-07-05 MED ORDER — NYSTATIN 100000 UNIT/GM EX POWD: CUTANEOUS | 3 refills | Status: AC
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
## 2024-07-05 NOTE — Patient Instructions (Signed)
 Consider Poise pads for urinary incontinence and use zinc  oxide (Desitin) for skin protection.

## 2024-07-17 ENCOUNTER — Inpatient Hospital Stay: Attending: Oncology

## 2024-07-17 ENCOUNTER — Ambulatory Visit (INDEPENDENT_AMBULATORY_CARE_PROVIDER_SITE_OTHER): Admit: 2024-07-17 | Discharge: 2024-07-17 | Disposition: A | Admitting: Radiology

## 2024-07-17 ENCOUNTER — Encounter: Payer: Self-pay | Admitting: Oncology

## 2024-07-17 ENCOUNTER — Other Ambulatory Visit (HOSPITAL_BASED_OUTPATIENT_CLINIC_OR_DEPARTMENT_OTHER): Payer: Self-pay

## 2024-07-17 ENCOUNTER — Encounter (HOSPITAL_BASED_OUTPATIENT_CLINIC_OR_DEPARTMENT_OTHER): Payer: Self-pay

## 2024-07-17 ENCOUNTER — Ambulatory Visit (HOSPITAL_BASED_OUTPATIENT_CLINIC_OR_DEPARTMENT_OTHER)
Admission: EM | Admit: 2024-07-17 | Discharge: 2024-07-17 | Disposition: A | Discharge status: Home / Self Care | Attending: Nurse Practitioner | Admitting: Nurse Practitioner

## 2024-07-17 ENCOUNTER — Other Ambulatory Visit (HOSPITAL_BASED_OUTPATIENT_CLINIC_OR_DEPARTMENT_OTHER): Admitting: Radiology

## 2024-07-17 VITALS — BP 125/79 | HR 74 | Temp 98.1°F | Resp 18

## 2024-07-17 DIAGNOSIS — E538 Deficiency of other specified B group vitamins: Secondary | ICD-10-CM | POA: Insufficient documentation

## 2024-07-17 DIAGNOSIS — J206 Acute bronchitis due to rhinovirus: Secondary | ICD-10-CM

## 2024-07-17 DIAGNOSIS — R051 Acute cough: Secondary | ICD-10-CM | POA: Diagnosis not present

## 2024-07-17 DIAGNOSIS — D509 Iron deficiency anemia, unspecified: Secondary | ICD-10-CM

## 2024-07-17 LAB — POC COVID19/FLU A&B COMBO
Covid Antigen, POC: NEGATIVE
Influenza A Antigen, POC: NEGATIVE
Influenza B Antigen, POC: NEGATIVE

## 2024-07-17 MED ORDER — AMOXICILLIN-POT CLAVULANATE 875-125 MG PO TABS
1.0000 | ORAL_TABLET | Freq: Two times a day (BID) | ORAL | 0 refills | Status: AC
  Filled 2024-07-17: qty 14, 7d supply, fill #0

## 2024-07-17 MED ORDER — BENZONATATE 200 MG PO CAPS
200.0000 mg | ORAL_CAPSULE | Freq: Three times a day (TID) | ORAL | 0 refills | Status: AC | PRN
  Filled 2024-07-17: qty 30, 10d supply, fill #0

## 2024-07-17 MED ORDER — PREDNISONE 10 MG PO TABS
ORAL_TABLET | ORAL | 0 refills | Status: AC
  Filled 2024-07-17: qty 12, 5d supply, fill #0

## 2024-07-17 MED ORDER — CYANOCOBALAMIN 1000 MCG/ML IJ SOLN
1000.0000 ug | Freq: Once | INTRAMUSCULAR | Status: AC
  Administered 2024-07-17: 1000 ug via INTRAMUSCULAR
  Filled 2024-07-17: qty 1

## 2024-07-17 MED ORDER — ALBUTEROL SULFATE HFA 108 (90 BASE) MCG/ACT IN AERS
2.0000 | INHALATION_SPRAY | RESPIRATORY_TRACT | 0 refills | Status: AC | PRN
  Filled 2024-07-17: qty 13.4, 30d supply, fill #0

## 2024-07-17 MED ORDER — MUCINEX DM MAXIMUM STRENGTH 60-1200 MG PO TB12
1.0000 | ORAL_TABLET | Freq: Two times a day (BID) | ORAL | 0 refills | Status: AC
  Filled 2024-07-17: qty 20, fill #0

## 2024-07-17 MED ORDER — IPRATROPIUM-ALBUTEROL 0.5-2.5 (3) MG/3ML IN SOLN
3.0000 mL | Freq: Once | RESPIRATORY_TRACT | Status: AC
  Administered 2024-07-17: 3 mL via RESPIRATORY_TRACT

## 2024-07-17 NOTE — ED Triage Notes (Signed)
 Pt c/o sore throat and cough that started on Friday. She is also having nasal congestion and occasional chills. Pt denies fever, body aches, or HA. Pt has not taken anything for her symptoms.

## 2024-07-17 NOTE — Discharge Instructions (Addendum)
 You were seen today for cough and congestion. Your exam, chest X-ray, and testing suggest that your symptoms are due to bronchitis, which is irritation and inflammation of the airways. This can cause coughing, chest rattling, and mucus buildup, and it often lasts 1-2 weeks. You were given a breathing treatment in the clinic to help open the airways. You have been prescribed Augmentin to take as an antibiotic, prednisone to reduce inflammation, Mucinex DM to help loosen mucus, benzonatate to calm the cough, and an albuterol  inhaler to use when you feel wheezing or tightness in your chest. Take the medications as directed. Drink plenty of fluids, use a humidifier if available, and try warm tea or honey to soothe your throat. Rest is important while your body heals. Follow up with your primary care provider if your symptoms do not begin to improve in the next several days or if the cough lasts longer than expected. Go to the emergency department or seek urgent medical care if you develop severe shortness of breath, chest pain, coughing up blood, persistent high fever, vomiting that prevents you from keeping fluids down, or if you start to feel significantly worse at any time.

## 2024-07-17 NOTE — Patient Instructions (Signed)
 Vitamin B12 Deficiency Vitamin B12 deficiency means that your body does not have enough vitamin B12. The body needs this important vitamin: To make red blood cells. To make genes (DNA). To help the nerves work. If you do not have enough vitamin B12 in your body, you can have health problems, such as not having enough red blood cells in the blood (anemia). What are the causes? Not eating enough foods that contain vitamin B12. Not being able to take in (absorb) vitamin B12 from the food that you eat. Certain diseases. A condition in which the body does not make enough of a certain protein. This results in your body not taking in enough vitamin B12. Having a surgery in which part of the stomach or small intestine is taken out. Taking medicines that make it hard for the body to take in vitamin B12. These include: Heartburn medicines. Some medicines that are used to treat diabetes. What increases the risk? Being an older adult. Eating a vegetarian or vegan diet that does not include any foods that come from animals. Not eating enough foods that contain vitamin B12 while you are pregnant. Taking certain medicines. Having alcoholism. What are the signs or symptoms? In some cases, there are no symptoms. If the condition leads to too few blood cells or nerve damage, symptoms can occur, such as: Feeling weak or tired. Not being hungry. Losing feeling (numbness) or tingling in your hands and feet. Redness and burning of the tongue. Feeling sad (depressed). Confusion or memory problems. Trouble walking. If anemia is very bad, symptoms can include: Being short of breath. Being dizzy. Having a very fast heartbeat. How is this treated? Changing the way you eat and drink, such as: Eating more foods that contain vitamin B12. Drinking little or no alcohol. Getting vitamin B12 shots. Taking vitamin B12 supplements by mouth (orally). Your doctor will tell you the dose that is best for you. Follow  these instructions at home: Eating and drinking  Eat foods that come from animals and have a lot of vitamin B12 in them. These include: Meats and poultry. This includes beef, pork, chicken, [place], and organ meats, such as liver. Seafood, such as clams, rainbow trout, salmon, tuna, and haddock. Eggs. Dairy foods such as milk, yogurt, and cheese. Eat breakfast cereals that have vitamin B12 added to them (are fortified). Check the label. The items listed above may not be a complete list of foods and beverages you can eat and drink. Contact a dietitian for more information. Alcohol use Do not drink alcohol if: Your doctor tells you not to drink. You are pregnant, may be pregnant, or are planning to become pregnant. If you drink alcohol: Limit how much you have to: 0-1 drink a day for women. 0-2 drinks a day for men. Know how much alcohol is in your drink. In the [place] one drink equals one 12 oz bottle of beer (355 mL), one 5 oz glass of wine (148 mL), or one 1 oz glass of hard liquor (44 mL). General instructions Get any vitamin B12 shots if told by your doctor. Take supplements only as told by your doctor. Follow the directions. Keep all follow-up visits. Contact a doctor if: Your symptoms come back. Your symptoms get worse or do not get better with treatment. Get help right away if: You have trouble breathing. You have a very fast heartbeat. You have chest pain. You get dizzy. You faint. These symptoms may be an emergency. Get help right away. Call 911.  Do not wait to see if the symptoms will go away. Do not drive yourself to the hospital. Summary Vitamin B12 deficiency means that your body is not getting enough of the vitamin. In some cases, there are no symptoms of this condition. Treatment may include making a change in the way you eat and drink, getting shots, or taking supplements. Eat foods that have vitamin B12 in them. This information is not intended to replace advice  given to you by your health care provider. Make sure you discuss any questions you have with your health care provider. Document Revised: 05/02/2021 Document Reviewed: 05/02/2021 Elsevier Patient Education  2024 [employer].
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
## 2024-08-14 ENCOUNTER — Inpatient Hospital Stay: Attending: Oncology

## 2024-08-14 VITALS — BP 145/68 | HR 77 | Temp 97.7°F | Resp 18

## 2024-08-14 DIAGNOSIS — E538 Deficiency of other specified B group vitamins: Secondary | ICD-10-CM | POA: Diagnosis present

## 2024-08-14 DIAGNOSIS — D509 Iron deficiency anemia, unspecified: Secondary | ICD-10-CM

## 2024-08-14 MED ORDER — CYANOCOBALAMIN 1000 MCG/ML IJ SOLN
1000.0000 ug | Freq: Once | INTRAMUSCULAR | Status: AC
  Administered 2024-08-14: 1000 ug via INTRAMUSCULAR
  Filled 2024-08-14: qty 1

## 2024-09-11 ENCOUNTER — Inpatient Hospital Stay: Attending: Oncology

## 2024-09-11 VITALS — BP 136/84 | HR 72 | Temp 97.6°F | Resp 18 | Ht 65.0 in | Wt 179.0 lb

## 2024-09-11 DIAGNOSIS — D509 Iron deficiency anemia, unspecified: Secondary | ICD-10-CM

## 2024-09-11 DIAGNOSIS — E538 Deficiency of other specified B group vitamins: Secondary | ICD-10-CM | POA: Diagnosis present

## 2024-09-11 MED ORDER — CYANOCOBALAMIN 1000 MCG/ML IJ SOLN
1000.0000 ug | Freq: Once | INTRAMUSCULAR | Status: AC
  Administered 2024-09-11: 1000 ug via INTRAMUSCULAR
  Filled 2024-09-11: qty 1

## 2024-09-14 ENCOUNTER — Other Ambulatory Visit: Payer: Self-pay | Admitting: Oncology

## 2024-09-14 DIAGNOSIS — M810 Age-related osteoporosis without current pathological fracture: Secondary | ICD-10-CM

## 2024-09-29 ENCOUNTER — Inpatient Hospital Stay

## 2024-09-29 ENCOUNTER — Inpatient Hospital Stay: Admitting: Oncology

## 2024-09-29 NOTE — Progress Notes (Incomplete)
 " [facility]  [address] [place],  [place]  [address] [phone]  Patient Care Team: Ofilia Lamar CROME, MD as PCP - General (Family Medicine)  Clinic Day: 09/29/2024  Referring physician: Ofilia Lamar CROME, MD  ASSESSMENT & PLAN:  Assessment: Iron  deficiency anemia She has only had a partial response to oral supplement despite being on it for 6 months.  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 She received IV iron  in June of 2023 and again in June, 2024.   Strong family history for malignancy This includes ovarian and colon cancer.  I do recommend a genetics clinic referral for further evaluation and recommendations.   B12 Deficiency B12 level was 156 on 03/05/22. She stopped monthly B12 injections in January and repeat B-12 level was down to 224 and dropping. We therefore have her back on monthly injections as this is most consistent with pernicious anemia.   Osteoporosis  Bone density scan done on 10/26/2023 which revealed significant worsening osteoporosis of the spine with a T-score of -2.8 from -1.6 and right femoral hip with a T-score of -2.1 from -1.8. so her bones have gone from osteopenia in June, 2022 to osteoporosis in February, 2025. I think this is largely due to her inactivity. I do recommend treatment for it in the form of alendronate  70 mg weekly. I have discussed the alternatives and potential toxicities. I would repeat a bone density scan in 2 years.   Hypercalcemia This is chronic and persists although her calcium supplement has been stopped.   Plan:    She had a screening bilateral mammogram done on 10/26/2023 which revealed calcifications in the right breast and was recommended for a diagnostic mammogram of the right breast for further evaluation. Repeat diagnostic mammogram of the right breast done on 12/27/2023 confirmed this to be probable benign calcifications in  the upper-inner quadrant of the right breast. She was then recommended repeat imaging in 6 months. Patient also had a bone density scan done on 10/26/2023 which revealed worsening osteoporosis of the spine with a T-score of -2.8 from -1.6 and right femoral hip with a T-score of -2.1 from -1.8. She last had IV iron  1 year ago and receives monthly B-12 injections. 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 I recommended a bone strengthening medication such as Fosamax , Prolia, or Reclast and informed her of the pros and cons of each. She agreed to start one so I will prescribe alendronate  70 mg once weekly. She has a WBC of 8.0, hemoglobin of 17.1, and platelet count of 278,000. Her CMP is normal other than a elevated calcium of 10.9, alkaline phosphatase of 139, and total bilirubin of 1.4. Her repeat mammogram will be scheduled for October, 2025 and I will see her back in 6 months with CBC and CMP.   I discussed the assessment and treatment plan with the patient. The patient was provided an opportunity to ask questions and all were answered.  The patient agreed with the plan and demonstrated an understanding of the instructions.  The patient was advised to call back if the symptoms worsen or if the condition fails to improve as anticipated.  I provided *** minutes  of face-to-face time during this this encounter and > 50% was spent counseling as documented under my assessment  and plan.   Wanda VEAR Cornish, MD  [facility] OF [facility] [address] [place] [address] Dept: [phone] Dept Fax: [phone]   No orders of the defined types were placed in this encounter.    CHIEF COMPLAINT:  CC: History of iron  deficiency anemia and B12 deficiency  Current Treatment:  Surveillance  INTERVAL HISTORY:  Adison is here today for repeat clinical  assessment for her history of iron  deficiency anemia and B12 deficiency. Patient states that she feels *** and ***.   She had a right breast diagnostic mammogram on 06/30/2024 which revealed stable, probably benign right breast calcifications. Chest X-ray on 07/17/2024 revealed mild bronchitic changes.    She has a WBC of ***, hemoglobin of ***, and platelet count of ***.       I will see her back in *** with ***. She is scheduled for a diagnostic mammogram on 01/01/2025.    She denies fever, chills, night sweats, or other signs of infection. She denies cardiorespiratory and gastrointestinal issues. She  denies pain. Her appetite is *** and Her weight {Weight change:10426}.  Wt Readings from Last 3 Encounters:  09/11/24 179 lb (81.2 kg)  03/29/24 179 lb 3.2 oz (81.3 kg)  04/30/23 177 lb 4 oz (80.4 kg)   Patient states that she feels ok and has no complaints of pain. She had a screening bilateral mammogram done on 10/26/2023 which revealed calcifications in the right breast and was recommended a diagnostic mammogram of the right breast for further evaluation. Repeat diagnostic mammogram of the right breast done on 12/27/2023 confirmed this to be probable benign calcifications in the upper-inner quadrant of the right breast. She was then recommended repeat imaging in 6 months. Patient also had a bone density scan done on 10/26/2023 which revealed worsening osteoporosis of the spine with a T-score of -2.8 from -1.6 and right femoral hip with a T-score of -2.1 from -1.8. She last had IV iron  1 year ago and receives monthly B-12 injections. 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 I recommended a bone strengthening medication such as Fosamax , Prolia, or Reclast and informed her of the pros and cons of each. She agreed to start one so I will prescribe alendronate  70 mg once weekly. She has a WBC of 8.0,  hemoglobin of 17.1, and platelet count of 278,000. Her CMP is normal other than a elevated calcium of 10.9, alkaline phosphatase of 139, and total bilirubin of 1.4. Her repeat mammogram will be scheduled for October, 2025 and I will see her back in 6 months with CBC and CMP.   She denies fever, chills, night sweats, or other signs of infection. She denies cardiorespiratory and gastrointestinal issues. She  denies pain. Her appetite is fine and Her weight has increased 2 pounds over last 11 months. This patient is accompanied in the office by her husband.    I have reviewed the past medical history, past surgical history, social history and family history with the patient and they are unchanged from previous note.  ALLERGIES:  is allergic to atorvastatin, other, and rosuvastatin.  MEDICATIONS:  Current Outpatient Medications  Medication Sig Dispense Refill   acetaminophen  (TYLENOL ) 325 MG tablet Take 650 mg by mouth every 4 (four) hours as needed for moderate pain.     albuterol  (VENTOLIN  HFA) 108 (90  Base) MCG/ACT inhaler Inhale 2 puffs into the lungs every 4 (four) hours as needed for wheezing or shortness of breath (bronchospasms). 13.4 g 0   alendronate  (FOSAMAX ) 70 MG tablet Take 1 tablet (70 mg total) by mouth once a week. Take with a full glass of water on an empty stomach. 12 tablet 1   amoxicillin -clavulanate (AUGMENTIN ) 875-125 MG tablet Take 1 tablet by mouth 2 (two) times daily after a meal. 14 tablet 0   benzonatate  (TESSALON ) 200 MG capsule Take 1 capsule (200 mg total) by mouth 3 (three) times daily as needed for cough. 30 capsule 0   calcium carbonate (OSCAL) 1500 (600 Ca) MG TABS tablet Take 600 mg by mouth 2 (two) times daily.  (Patient not taking: Reported on 07/05/2024)     carbidopa-levodopa (SINEMET IR) 25-100 MG tablet SMARTSIG:0-2 Tablet(s) By Mouth 3 Times Daily (Patient not taking: Reported on 09/11/2024)     cholecalciferol (VITAMIN D3) 25 MCG (1000 UNIT) tablet Take  1,000 Units by mouth in the morning and at bedtime.     clopidogrel (PLAVIX) 75 MG tablet Take 75 mg by mouth daily.     clotrimazole -betamethasone  (LOTRISONE ) cream Apply 1 Application topically 2 (two) times daily. Use twice daily for up to 2 weeks as needed. 30 g 0   CYMBALTA 30 MG capsule Take 30 mg by mouth daily.     Dextromethorphan -guaiFENesin  (MUCINEX  DM MAXIMUM STRENGTH) 60-1200 MG TB12 Take 1 tablet by mouth 2 (two) times daily. 20 tablet 0   donepezil (ARICEPT) 10 MG tablet Take 10 mg by mouth daily.     empagliflozin  (JARDIANCE ) 25 MG TABS tablet Take 25 mg by mouth daily.     ezetimibe  (ZETIA ) 10 MG tablet Take 10 mg by mouth at bedtime.     gabapentin  (NEURONTIN ) 300 MG capsule Take 1 capsule (300 mg total) by mouth daily.     irbesartan  (AVAPRO ) 150 MG tablet Take 150 mg by mouth daily.     levothyroxine  (SYNTHROID ) 100 MCG tablet Take 100 mcg by mouth daily.     metoprolol  tartrate (LOPRESSOR ) 25 MG tablet Take 0.5 tablets (12.5 mg total) by mouth 2 (two) times daily.     nystatin  (MYCOSTATIN /NYSTOP ) powder Apply a small amount externally daily. 30 g 3   REPATHA SURECLICK 140 MG/ML SOAJ Inject into the skin.     simvastatin  (ZOCOR ) 40 MG tablet SMARTSIG:1 Tablet(s) By Mouth Every Evening     TRULICITY  3 MG/0.5ML SOPN Inject 3 mg into the muscle every Wednesday.     No current facility-administered medications for this visit.    HISTORY OF PRESENT ILLNESS:   Oncology History   No problem history exists.    REVIEW OF SYSTEMS:  Review of Systems  Constitutional:  Positive for fatigue. Negative for appetite change, chills, diaphoresis, fever and unexpected weight change.  HENT:  Negative.  Negative for hearing loss, lump/mass, mouth sores, nosebleeds, sore throat, tinnitus, trouble swallowing and voice change.   Eyes: Negative.  Negative for eye problems and icterus.  Respiratory: Negative.  Negative for chest tightness, cough, hemoptysis, shortness of breath and wheezing.    Cardiovascular: Negative.  Negative for chest pain, leg swelling and palpitations.  Gastrointestinal: Negative.  Negative for abdominal distention, abdominal pain, blood in stool, constipation, diarrhea, nausea, rectal pain and vomiting.  Endocrine: Negative.   Genitourinary: Negative.  Negative for bladder incontinence, difficulty urinating, dyspareunia, dysuria, frequency, hematuria, menstrual problem, nocturia, pelvic pain, vaginal bleeding and vaginal discharge.   Musculoskeletal:  Positive  for gait problem (wheelchair). Negative for arthralgias, back pain, flank pain, myalgias, neck pain and neck stiffness.       Left hip pain  Skin: Negative.  Negative for itching, rash and wound.  Neurological:  Positive for extremity weakness (limited walking ability) and gait problem (wheelchair). Negative for dizziness, headaches, light-headedness, numbness, seizures and speech difficulty.       Tremor, Parkinson's disease  Hematological: Negative.  Negative for adenopathy. Does not bruise/bleed easily.  Psychiatric/Behavioral: Negative.  Negative for confusion, decreased concentration, depression, sleep disturbance and suicidal ideas. The patient is not nervous/anxious.    VITALS:  Last menstrual period 02/20/2000.  There were no vitals filed for this visit.  Wt Readings from Last 3 Encounters:  09/11/24 179 lb (81.2 kg)  03/29/24 179 lb 3.2 oz (81.3 kg)  04/30/23 177 lb 4 oz (80.4 kg)    There is no height or weight on file to calculate BMI.  Performance status (ECOG): 2 - Symptomatic, <50% confined to bed  PHYSICAL EXAM:  Physical Exam Vitals and nursing note reviewed. Exam conducted with a chaperone present.  Constitutional:      General: She is not in acute distress.    Appearance: Normal appearance. She is normal weight. She is not ill-appearing, toxic-appearing or diaphoretic.  HENT:     Head: Normocephalic and atraumatic.     Right Ear: Tympanic membrane, ear canal and external  ear normal. There is no impacted cerumen.     Left Ear: Tympanic membrane, ear canal and external ear normal. There is no impacted cerumen.     Nose: Nose normal. No congestion or rhinorrhea.     Mouth/Throat:     Mouth: Mucous membranes are moist.     Pharynx: Oropharynx is clear. No oropharyngeal exudate or posterior oropharyngeal erythema.  Eyes:     General: No scleral icterus.       Right eye: No discharge.        Left eye: No discharge.     Extraocular Movements: Extraocular movements intact.     Conjunctiva/sclera: Conjunctivae normal.     Pupils: Pupils are equal, round, and reactive to light.  Neck:     Vascular: No carotid bruit.  Cardiovascular:     Rate and Rhythm: Normal rate and regular rhythm.     Pulses: Normal pulses.     Heart sounds: Normal heart sounds. No murmur heard.    No friction rub. No gallop.  Pulmonary:     Effort: Pulmonary effort is normal. No respiratory distress.     Breath sounds: Normal breath sounds. No stridor. No wheezing, rhonchi or rales.  Chest:     Chest wall: No tenderness.  Abdominal:     General: Bowel sounds are normal. There is no distension.     Palpations: Abdomen is soft. There is no hepatomegaly, splenomegaly or mass.     Tenderness: There is no abdominal tenderness. There is no right CVA tenderness, left CVA tenderness, guarding or rebound.     Hernia: No hernia is present.  Musculoskeletal:        General: No swelling, tenderness, deformity or signs of injury. Normal range of motion.     Cervical back: Normal range of motion and neck supple. No rigidity or tenderness.     Right lower leg: No edema.     Left lower leg: No edema.     Left foot: No swelling.  Lymphadenopathy:     Cervical: No cervical adenopathy.  Skin:  General: Skin is warm and dry.     Coloration: Skin is not jaundiced or pale.     Findings: No bruising, erythema, lesion or rash.  Neurological:     General: No focal deficit present.     Mental Status:  She is alert and oriented to person, place, and time. Mental status is at baseline.     Cranial Nerves: No cranial nerve deficit.     Sensory: No sensory deficit.     Motor: Tremor present. No weakness.     Coordination: Coordination normal.     Gait: Gait normal.     Deep Tendon Reflexes: Reflexes normal.     Comments: Tremor of both hands  Psychiatric:        Mood and Affect: Mood normal.        Behavior: Behavior normal.        Thought Content: Thought content normal.        Judgment: Judgment normal.    LABORATORY DATA:  I have reviewed the data as listed    Component Value Date/Time   NA 141 03/29/2024 1049   K 3.8 03/29/2024 1049   CL 105 03/29/2024 1049   CO2 24 03/29/2024 1049   GLUCOSE 149 (H) 03/29/2024 1049   BUN 13 03/29/2024 1049   CREATININE 0.70 03/29/2024 1049   CALCIUM 10.9 (H) 03/29/2024 1049   PROT 7.4 03/29/2024 1049   ALBUMIN 4.2 03/29/2024 1049   AST 15 03/29/2024 1049   ALT 15 03/29/2024 1049   ALKPHOS 139 (H) 03/29/2024 1049   BILITOT 1.4 (H) 03/29/2024 1049   GFRNONAA >60 03/29/2024 1049   No results found for: SPEP, UPEP     Latest Ref Rng & Units 03/29/2024   10:49 AM 09/30/2023    1:04 PM 05/02/2023    2:35 AM  CBC  WBC 4.0 - 10.5 K/uL 8.0  6.6  8.5   Hemoglobin 12.0 - 15.0 g/dL 82.8  84.1  85.8   Hematocrit 36.0 - 46.0 % 53.4  47.5  43.4   Platelets 150 - 400 K/uL 278  280  234    Lab Results  Component Value Date   TSH 4.993 (H) 05/01/2023   Lab Results  Component Value Date   VITAMINB12 341 09/30/2023   Lab Results  Component Value Date   IRON  113 09/30/2023   TIBC 372 09/30/2023   FERRITIN 354 (H) 09/30/2023    RADIOGRAPHIC STUDIES: I have personally reviewed the radiological images as listed and agreed with the findings in the report. EXAM: 07/17/2024 CHEST - 2 VIEW IMPRESSION: Mild bronchitic changes.  EXAM: 06/30/2024 DIGITAL DIAGNOSTIC UNILATERAL RIGHT MAMMOGRAM WITH TOMOSYNTHESIS AND CAD IMPRESSION: Stable  probably benign right breast calcifications.  EXAM: 12/27/2023 DIGITAL DIAGNOSTIC UNILATERAL RIGHT MAMMOGRAM IMPRESSION: Probable benign calcifications in the upper-inner quadrant of the right breast. ECOMMENDATION: Short-term interval follow-up right mammogram in 6 months is recommended.  EXAM: 10/26/2023 DUAL X-RAY ABSORPTIOMETRY (DXA) FOR BONE MINERAL DENSITY LUMBAR SPINE (L1-L2): BMD (in g/cm2): 0.828 T-score: -2.8 Z-score: -1.1   LEFT FEMORAL NECK: BMD (in g/cm2): 0.714 T-score: -2.3 Z-score: -0.5   LEFT TOTAL HIP: BMD (in g/cm2): 0.686 T-score: -2.6 Z-score: -1.0   RIGHT FEMORAL NECK: BMD (in g/cm2): 0.742 T-score: -2.1 Z-score: -0.3   RIGHT TOTAL HIP: BMD (in g/cm2): 0.735 T-score: -2.2 Z-score: -0.6    IMPRESSION: Osteoporosis based on BMD.   EXAM: 10/26/2023 DIGITAL SCREENING BILATERAL MAMMOGRAM WITH TOMOSYNTHESIS AND CAD IMPRESSION: Further evaluation is suggested for calcifications in the  right breast. RECOMMENDATION: Diagnostic mammogram of the right breast. (Code:FI-R-63M)  I, Aretta Cook, acting as a scribe for Wanda VEAR Cornish, MD, have documented all relevant documentation on the behalf of Wanda VEAR Cornish, MD, as directed by Wanda VEAR Cornish, MD, while in the presence of Wanda VEAR Cornish, MD.  I have reviewed this report as typed by the medical scribe, and it is complete and accurate.    "

## 2024-10-19 ENCOUNTER — Inpatient Hospital Stay: Attending: Oncology

## 2024-10-19 ENCOUNTER — Inpatient Hospital Stay: Admitting: Oncology

## 2024-11-02 ENCOUNTER — Inpatient Hospital Stay

## 2024-11-02 ENCOUNTER — Inpatient Hospital Stay: Admitting: Oncology

## 2024-12-25 ENCOUNTER — Encounter: Admitting: Obstetrics and Gynecology

## 2025-01-01 ENCOUNTER — Encounter
